# Patient Record
Sex: Female | Born: 1950 | Race: White | Hispanic: No | Smoking: Never smoker
Health system: Southern US, Community
[De-identification: ages and names within clinical notes are randomized; demographics above are authoritative.]

## PROBLEM LIST (undated history)

## (undated) DIAGNOSIS — E039 Hypothyroidism, unspecified: Secondary | ICD-10-CM

## (undated) DIAGNOSIS — M179 Osteoarthritis of knee, unspecified: Secondary | ICD-10-CM

## (undated) DIAGNOSIS — M1710 Unilateral primary osteoarthritis, unspecified knee: Secondary | ICD-10-CM

## (undated) DIAGNOSIS — E7800 Pure hypercholesterolemia, unspecified: Secondary | ICD-10-CM

## (undated) HISTORY — DX: Osteoarthritis of knee, unspecified: M17.9

## (undated) HISTORY — DX: Pure hypercholesterolemia, unspecified: E78.00

## (undated) HISTORY — PX: JOINT REPLACEMENT: SHX530

## (undated) HISTORY — PX: EYE SURGERY: SHX253

## (undated) HISTORY — DX: Unilateral primary osteoarthritis, unspecified knee: M17.10

## (undated) HISTORY — DX: Hypothyroidism, unspecified: E03.9

---

## 2015-03-22 HISTORY — PX: OTHER SURGICAL HISTORY: SHX169

## 2016-04-29 LAB — TSH: TSH: 18.31 — AB (ref 0.41–5.90)

## 2017-08-31 ENCOUNTER — Ambulatory Visit: Payer: Self-pay | Admitting: Family Medicine

## 2017-10-06 ENCOUNTER — Ambulatory Visit (INDEPENDENT_AMBULATORY_CARE_PROVIDER_SITE_OTHER): Payer: 59 | Admitting: Family Medicine

## 2017-10-06 ENCOUNTER — Encounter: Payer: Self-pay | Admitting: Family Medicine

## 2017-10-06 VITALS — BP 160/90 | HR 100 | Temp 97.4°F | Wt 253.6 lb

## 2017-10-06 DIAGNOSIS — E039 Hypothyroidism, unspecified: Secondary | ICD-10-CM | POA: Diagnosis not present

## 2017-10-06 DIAGNOSIS — Z23 Encounter for immunization: Secondary | ICD-10-CM | POA: Diagnosis not present

## 2017-10-06 DIAGNOSIS — M2550 Pain in unspecified joint: Secondary | ICD-10-CM

## 2017-10-06 DIAGNOSIS — E7800 Pure hypercholesterolemia, unspecified: Secondary | ICD-10-CM | POA: Diagnosis not present

## 2017-10-06 LAB — CBC WITH DIFFERENTIAL/PLATELET
Basophils Absolute: 0.1 10*3/uL (ref 0.0–0.1)
Basophils Relative: 0.7 % (ref 0.0–3.0)
Eosinophils Absolute: 0.4 10*3/uL (ref 0.0–0.7)
Eosinophils Relative: 3.9 % (ref 0.0–5.0)
HCT: 43.4 % (ref 36.0–46.0)
Hemoglobin: 14 g/dL (ref 12.0–15.0)
Lymphocytes Relative: 23.2 % (ref 12.0–46.0)
Lymphs Abs: 2.1 10*3/uL (ref 0.7–4.0)
MCHC: 32.3 g/dL (ref 30.0–36.0)
MCV: 87.4 fl (ref 78.0–100.0)
Monocytes Absolute: 0.6 10*3/uL (ref 0.1–1.0)
Monocytes Relative: 6.4 % (ref 3.0–12.0)
Neutro Abs: 6.1 10*3/uL (ref 1.4–7.7)
Neutrophils Relative %: 65.8 % (ref 43.0–77.0)
Platelets: 253 10*3/uL (ref 150.0–400.0)
RBC: 4.97 Mil/uL (ref 3.87–5.11)
RDW: 14.6 % (ref 11.5–15.5)
WBC: 9.2 10*3/uL (ref 4.0–10.5)

## 2017-10-06 LAB — COMPREHENSIVE METABOLIC PANEL
ALT: 10 U/L (ref 0–35)
AST: 17 U/L (ref 0–37)
Albumin: 4.3 g/dL (ref 3.5–5.2)
Alkaline Phosphatase: 96 U/L (ref 39–117)
BUN: 19 mg/dL (ref 6–23)
CO2: 29 mEq/L (ref 19–32)
Calcium: 9.6 mg/dL (ref 8.4–10.5)
Chloride: 103 mEq/L (ref 96–112)
Creatinine, Ser: 0.96 mg/dL (ref 0.40–1.20)
GFR: 62.12 mL/min (ref >60.00)
Glucose, Bld: 89 mg/dL (ref 70–99)
Potassium: 4.2 mEq/L (ref 3.5–5.1)
Sodium: 141 mEq/L (ref 135–145)
Total Bilirubin: 0.5 mg/dL (ref 0.2–1.2)
Total Protein: 7.5 g/dL (ref 6.0–8.3)

## 2017-10-06 LAB — LIPID PANEL
Cholesterol: 318 mg/dL — ABNORMAL HIGH (ref 0–200)
HDL: 72.2 mg/dL (ref >39.00)
LDL Cholesterol: 217 mg/dL — ABNORMAL HIGH (ref 0–99)
NonHDL: 245.46
Total CHOL/HDL Ratio: 4
Triglycerides: 144 mg/dL (ref 0.0–149.0)
VLDL: 28.8 mg/dL (ref 0.0–40.0)

## 2017-10-06 LAB — TSH: TSH: 112.51 u[IU]/mL — ABNORMAL HIGH (ref 0.35–4.50)

## 2017-10-06 LAB — T4, FREE: Free T4: 0.39 ng/dL — ABNORMAL LOW (ref 0.60–1.60)

## 2017-10-06 MED ORDER — MELOXICAM 15 MG PO TABS: 15.0000 mg | ORAL_TABLET | Freq: Every day | ORAL | 3 refills | Status: DC

## 2017-10-06 MED ORDER — LEVOTHYROXINE SODIUM 75 MCG PO TABS: 75.0000 ug | ORAL_TABLET | Freq: Every day | ORAL | 0 refills | Status: DC

## 2017-10-06 NOTE — Progress Notes (Signed)
Tina Dalton is a 67 y.o. female is here to [facility].   Patient Care Team: Helane Rima, DO as PCP - General (Family Medicine)   History of Present Illness:   HPI: See Assessment and Plan section for Problem Based Charting of issues discussed today.  Health Maintenance Due  Topic Date Due  . Hepatitis C Screening  1951/07/07  . HIV Screening  06/02/1968  . TETANUS/TDAP  06/02/1972  . MAMMOGRAM  06/03/2003   Depression screen PHQ 2/9 10/06/2017  Decreased Interest 0  Down, Depressed, Hopeless 0  PHQ - 2 Score 0   PMHx, SurgHx, SocialHx, Medications, and Allergies were reviewed in the Visit Navigator and updated as appropriate.   History reviewed. No pertinent past medical history. History reviewed. No pertinent surgical history. History reviewed. No pertinent family history. Social History   Tobacco Use  . Smoking status: Not on file  . Smokeless tobacco: Never Used  Substance Use Topics  . Alcohol use: Not on file  . Drug use: Not on file   Current Medications and Allergies:   .  None  No Known Allergies   Review of Systems:   Pertinent items are noted in the HPI. Otherwise, ROS is negative.  Vitals:   Vitals:   10/06/17 0913  BP: (!) 160/90  Pulse: 100  Temp: (!) 97.4 F (36.3 C)  TempSrc: Oral  SpO2: 97%  Weight: 253 lb 9.6 oz (115 kg)     There is no height or weight on file to calculate BMI.  Physical Exam:   Physical Exam  Constitutional: She is oriented to person, place, and time. She appears well-developed and well-nourished. No distress.  HENT:  Head: Normocephalic and atraumatic.  Right Ear: External ear normal.  Left Ear: External ear normal.  Nose: Nose normal.  Mouth/Throat: Oropharynx is clear and moist.  Eyes: Conjunctivae and EOM are normal. Pupils are equal, round, and reactive to light.  Neck: Normal range of motion. Neck supple. No thyromegaly present.  Cardiovascular: Normal rate, regular rhythm, normal heart sounds  and intact distal pulses.  Pulmonary/Chest: Effort normal and breath sounds normal.  Abdominal: Soft. Bowel sounds are normal.  Musculoskeletal: Normal range of motion.  Lymphadenopathy:    She has no cervical adenopathy.  Neurological: She is alert and oriented to person, place, and time.  Skin: Skin is warm and dry. Capillary refill takes less than 2 seconds.  Psychiatric: She has a normal mood and affect. Her behavior is normal.  Nursing note and vitals reviewed.  Results for orders placed or performed in visit on 10/06/17  T4, free  Result Value Ref Range   Free T4 0.39 (L) 0.60 - 1.60 ng/dL  TSH  Result Value Ref Range   TSH 112.51 (H) 0.35 - 4.50 uIU/mL  CBC with Differential/Platelet  Result Value Ref Range   WBC 9.2 4.0 - 10.5 K/uL   RBC 4.97 3.87 - 5.11 Mil/uL   Hemoglobin 14.0 12.0 - 15.0 g/dL   HCT 69.6 29.5 - 28.4 %   MCV 87.4 78.0 - 100.0 fl   MCHC 32.3 30.0 - 36.0 g/dL   RDW 13.2 44.0 - 10.2 %   Platelets 253.0 150.0 - 400.0 K/uL   Neutrophils Relative % 65.8 43.0 - 77.0 %   Lymphocytes Relative 23.2 12.0 - 46.0 %   Monocytes Relative 6.4 3.0 - 12.0 %   Eosinophils Relative 3.9 0.0 - 5.0 %   Basophils Relative 0.7 0.0 - 3.0 %   Neutro  Abs 6.1 1.4 - 7.7 K/uL   Lymphs Abs 2.1 0.7 - 4.0 K/uL   Monocytes Absolute 0.6 0.1 - 1.0 K/uL   Eosinophils Absolute 0.4 0.0 - 0.7 K/uL   Basophils Absolute 0.1 0.0 - 0.1 K/uL  Comprehensive metabolic panel  Result Value Ref Range   Sodium 141 135 - 145 mEq/L   Potassium 4.2 3.5 - 5.1 mEq/L   Chloride 103 96 - 112 mEq/L   CO2 29 19 - 32 mEq/L   Glucose, Bld 89 70 - 99 mg/dL   BUN 19 6 - 23 mg/dL   Creatinine, Ser 4.540.96 0.40 - 1.20 mg/dL   Total Bilirubin 0.5 0.2 - 1.2 mg/dL   Alkaline Phosphatase 96 39 - 117 U/L   AST 17 0 - 37 U/L   ALT 10 0 - 35 U/L   Total Protein 7.5 6.0 - 8.3 g/dL   Albumin 4.3 3.5 - 5.2 g/dL   Calcium 9.6 8.4 - 09.810.5 mg/dL   GFR 11.9162.12 >47.82>60.00 mL/min  Lipid panel  Result Value Ref Range    Cholesterol 318 (H) 0 - 200 mg/dL   Triglycerides 956.2144.0 0.0 - 149.0 mg/dL   HDL 13.0872.20 >65.78>39.00 mg/dL   VLDL 46.928.8 0.0 - 62.940.0 mg/dL   LDL Cholesterol 528217 (H) 0 - 99 mg/dL   Total CHOL/HDL Ratio 4    NonHDL 245.46    Assessment and Plan:   1. Acquired hypothyroidism Endocrine ROS: positive for - hair pattern changes, malaise/lethargy, skin changes and temperature intolerance; negative for - mood swings, palpitations or unexpected weight changes. The patient has NOT BEEN TAKING HER LEVOTHYROXINE FOR MONTHS. We discussed the dangers associated with this. Will restart at 75 mcg, with instructions for the patient to increase after a few weeks. We will recheck in 2 months. Red falgs reviewed.  - T4, free - TSH - CBC with Differential/Platelet - Comprehensive metabolic panel - levothyroxine (SYNTHROID, LEVOTHROID) 75 MCG tablet; Take 1 tablet (75 mcg total) by mouth daily for 14 days.  Dispense: 14 tablet; Refill: 0  2. Need for immunization against influenza - Flu Vaccine QUAD 36+ mos IM  3. Pure hypercholesterolemia  Lab Results  Component Value Date   CHOL 318 (H) 10/06/2017   HDL 72.20 10/06/2017   LDLCALC 217 (H) 10/06/2017   TRIG 144.0 10/06/2017   CHOLHDL 4 10/06/2017   I will recommend a statin.   - Lipid panel  4. Arthralgia of multiple joints Usually taking NSAIDs OTC daily. Would like to try something daily.  - meloxicam (MOBIC) 15 MG tablet; Take 1 tablet (15 mg total) by mouth daily.  Dispense: 30 tablet; Refill: 3   . Reviewed expectations re: course of current medical issues. . Discussed self-management of symptoms. . Outlined signs and symptoms indicating need for more acute intervention. . Patient verbalized understanding and all questions were answered. Marland Kitchen. Health Maintenance issues including appropriate healthy diet, exercise, and smoking avoidance were discussed with patient. . See orders for this visit as documented in the electronic medical record. . Patient  received an After Visit Summary.  Helane RimaErica Griselda Tosh, DO [place] 10/10/2017  Records requested if needed. Time spent with the patient: 30 minutes, of which >50% was spent in obtaining information about her symptoms, reviewing her previous labs, evaluations, and treatments, counseling her about her condition (please see the discussed topics above), and developing a plan to further investigate it; she had a number of questions which I addressed.

## 2017-10-18 ENCOUNTER — Telehealth: Payer: Self-pay | Admitting: Family Medicine

## 2017-10-18 ENCOUNTER — Other Ambulatory Visit: Payer: Self-pay

## 2017-10-18 DIAGNOSIS — E039 Hypothyroidism, unspecified: Secondary | ICD-10-CM

## 2017-10-18 MED ORDER — LEVOTHYROXINE SODIUM 150 MCG PO TABS: 150.0000 ug | ORAL_TABLET | Freq: Every day | ORAL | 3 refills | Status: DC

## 2017-10-18 NOTE — Telephone Encounter (Signed)
Please advise 

## 2017-10-18 NOTE — Telephone Encounter (Signed)
Copied from CRM [phone]. Topic: Quick Communication - Rx Refill/Question >> Oct 18, 2017 11:36 AM Oneal GroutSebastian, Jennifer S wrote: Medication: levothyroxine (SYNTHROID, LEVOTHROID) 75 MCG tablet; Take 1 tablet , states dose should be increase?   Has the patient contacted their pharmacy? No. Dose change   (Agent: If no, request that the patient contact the pharmacy for the refill.)   Preferred Pharmacy (with phone number or street name): [facility] [address]    Agent: Please be advised that RX refills may take up to 3 business days. We ask that you follow-up with your pharmacy.

## 2017-10-18 NOTE — Telephone Encounter (Signed)
Scrip sent 

## 2017-10-25 ENCOUNTER — Telehealth: Payer: Self-pay | Admitting: Family Medicine

## 2017-10-25 MED ORDER — SIMVASTATIN 20 MG PO TABS: 20.0000 mg | ORAL_TABLET | Freq: Every evening | ORAL | 3 refills | Status: DC

## 2017-10-25 NOTE — Telephone Encounter (Signed)
[facility] but was [facility] after patient did return call and we were not able to reach it looks like it was closed. Below is the original message to patient.   Let the patient know that her TSH was very high as expected. I gave 75 mcg tabs after the last visit. If tolerating, okay to increase to 150 mcg. Recheck labs (TSH, free T4) after taking higher dose for 6 weeks.   Cholesterol high. HDL good and high but LDL also high. Would be reasonable to start a statin. Let me know if she is up for it.

## 2017-10-25 NOTE — Telephone Encounter (Signed)
Zocor sent in.

## 2017-10-25 NOTE — Telephone Encounter (Signed)
Pt given results per notes of Dr. Earlene PlaterWallace on 10/25/17 Unable to document in result note due to [facility] being [facility]. She has started the increased dose of Synthroid.  Patient is willing to start a statin as recommended by physician.  [facility] [address] This is her pharmacy of choice and is documented in chart. She will phone later to schedule 6 month follow-up labs.

## 2017-10-27 ENCOUNTER — Encounter: Payer: Self-pay | Admitting: Family Medicine

## 2017-10-28 LAB — COLOGUARD

## 2017-11-04 ENCOUNTER — Telehealth: Payer: Self-pay

## 2017-11-04 NOTE — Telephone Encounter (Signed)
Results received as positive. Report put in your green folder for you to review.

## 2017-11-08 NOTE — Telephone Encounter (Signed)
Please call patient to come in for appointment to discuss all labs. Let me know if we are still unable to contact her.

## 2017-11-09 NOTE — Telephone Encounter (Signed)
Pt informed app made.  

## 2017-11-17 ENCOUNTER — Ambulatory Visit (INDEPENDENT_AMBULATORY_CARE_PROVIDER_SITE_OTHER): Payer: 59 | Admitting: Family Medicine

## 2017-11-17 ENCOUNTER — Encounter: Payer: Self-pay | Admitting: Family Medicine

## 2017-11-17 VITALS — BP 150/88 | HR 82 | Temp 98.6°F | Wt 252.6 lb

## 2017-11-17 DIAGNOSIS — R030 Elevated blood-pressure reading, without diagnosis of hypertension: Secondary | ICD-10-CM | POA: Insufficient documentation

## 2017-11-17 DIAGNOSIS — I10 Essential (primary) hypertension: Secondary | ICD-10-CM | POA: Diagnosis not present

## 2017-11-17 DIAGNOSIS — M2550 Pain in unspecified joint: Secondary | ICD-10-CM | POA: Diagnosis not present

## 2017-11-17 DIAGNOSIS — E7800 Pure hypercholesterolemia, unspecified: Secondary | ICD-10-CM | POA: Diagnosis not present

## 2017-11-17 DIAGNOSIS — R195 Other fecal abnormalities: Secondary | ICD-10-CM | POA: Diagnosis not present

## 2017-11-17 DIAGNOSIS — E039 Hypothyroidism, unspecified: Secondary | ICD-10-CM | POA: Diagnosis not present

## 2017-11-17 DIAGNOSIS — Z23 Encounter for immunization: Secondary | ICD-10-CM

## 2017-11-17 LAB — TSH: TSH: 0.94 u[IU]/mL (ref 0.35–4.50)

## 2017-11-17 LAB — T4, FREE: Free T4: 1.18 ng/dL (ref 0.60–1.60)

## 2017-11-17 NOTE — Progress Notes (Deleted)
   Tina Dalton is a 67 y.o. female is here for follow up.  History of Present Illness:   {CMA SCRIBE ATTESTATION}  HPI:   1. Acquired hypothyroidism   2. Pure hypercholesterolemia   3. Positive colorectal cancer screening using Cologuard test   4. Arthralgia of multiple joints   5. Essential hypertension    ROS  Health Maintenance Due  Topic Date Due  . Hepatitis C Screening  July 18, 1951  . HIV Screening  06/02/1968  . TETANUS/TDAP  06/02/1972  . MAMMOGRAM  06/03/2003   Depression screen PHQ 2/9 10/06/2017  Decreased Interest 0  Down, Depressed, Hopeless 0  PHQ - 2 Score 0   The 10-year ASCVD risk score Denman George(Goff DC Jr., et al., 2013) is: 7.8%   Values used to calculate the score:     Age: [age] years     Sex: Female     Is Non-Hispanic African American: No     Diabetic: No     Tobacco smoker: No     Systolic Blood Pressure: 150 mmHg     Is BP treated: No     HDL Cholesterol: 72.2 mg/dL     Total Cholesterol: 318 mg/dL  PMHx, SurgHx, SocialHx, FamHx, Medications, and Allergies were reviewed in the Visit Navigator and updated as appropriate.   Patient Active Problem List   Diagnosis Date Noted  . Acquired hypothyroidism 11/17/2017  . Pure hypercholesterolemia 11/17/2017  . Arthralgia of multiple joints 11/17/2017   Social History   Tobacco Use  . Smoking status: Never Smoker  . Smokeless tobacco: Never Used  Substance Use Topics  . Alcohol use: Not on file  . Drug use: Not on file   Current Medications and Allergies:   Current Outpatient Medications:  .  levothyroxine (SYNTHROID, LEVOTHROID) 150 MCG tablet, Take 1 tablet (150 mcg total) by mouth daily., Disp: 30 tablet, Rfl: 3 .  meloxicam (MOBIC) 15 MG tablet, Take 1 tablet (15 mg total) by mouth daily., Disp: 30 tablet, Rfl: 3 .  simvastatin (ZOCOR) 20 MG tablet, Take 1 tablet (20 mg total) by mouth every evening., Disp: 90 tablet, Rfl: 3  No Known Allergies Review of Systems   Pertinent items are noted in  the HPI. Otherwise, ROS is negative.  Vitals:   Vitals:   11/17/17 0846  BP: (!) 150/88  Pulse: 82  Temp: 98.6 F (37 C)  TempSrc: Oral  SpO2: 97%  Weight: 252 lb 9.6 oz (114.6 kg)     There is no height or weight on file to calculate BMI.  Physical Exam:   Physical Exam Results for orders placed or performed in visit on 10/19/17  TSH  Result Value Ref Range   TSH 18.31 (A) 0.41 - 5.90    Assessment and Plan:   ***

## 2017-11-17 NOTE — Progress Notes (Signed)
Tina Dalton is a 67 y.o. female is here for follow up.  History of Present Illness:   Barnie MortJoEllen Thompson, CMA acting as scribe for Dr. Helane RimaErica Nyjah Denio.   HPI: Patient in office today to review cologued results.   1. Acquired hypothyroidism.  Tolerating medication.  No chest pain or shortness of breath or swelling.   2. Pure hypercholesterolemia.  Not taking prescribed statin.   3. Positive colorectal cancer screening using Cologuard test. Here to discuss.   4. Arthralgia of multiple joints.  Taking Mobic without any issues.   Review of Systems  Constitutional: Negative for chills, fever and weight loss.  HENT: Negative for ear pain and hearing loss.   Eyes: Negative for blurred vision and double vision.  Respiratory: Negative for cough.   Cardiovascular: Negative for chest pain and palpitations.  Gastrointestinal: Negative for heartburn, nausea and vomiting.  Genitourinary: Negative for dysuria and urgency.  Musculoskeletal: Negative for myalgias.  Neurological: Negative for dizziness and headaches.  Endo/Heme/Allergies: Does not bruise/bleed easily.   Health Maintenance Due  Topic Date Due  . Hepatitis C Screening  27-Jul-1951  . HIV Screening  06/02/1968  . TETANUS/TDAP  06/02/1972  . MAMMOGRAM  06/03/2003   Depression screen PHQ 2/9 10/06/2017  Decreased Interest 0  Down, Depressed, Hopeless 0  PHQ - 2 Score 0   PMHx, SurgHx, SocialHx, FamHx, Medications, and Allergies were reviewed in the Visit Navigator and updated as appropriate.   Patient Active Problem List   Diagnosis Date Noted  . Acquired hypothyroidism 11/17/2017  . Pure hypercholesterolemia 11/17/2017  . Arthralgia of multiple joints 11/17/2017   Social History   Tobacco Use  . Smoking status: Never Smoker  . Smokeless tobacco: Never Used  Substance Use Topics  . Alcohol use: Not on file  . Drug use: Not on file   Current Medications and Allergies:   .  levothyroxine (SYNTHROID, LEVOTHROID) 150  MCG tablet, Take 1 tablet (150 mcg total) by mouth daily., Disp: 30 tablet, Rfl: 3 .  meloxicam (MOBIC) 15 MG tablet, Take 1 tablet (15 mg total) by mouth daily., Disp: 30 tablet, Rfl: 3  No Known Allergies   Review of Systems   Pertinent items are noted in the HPI. Otherwise, ROS is negative.  Vitals:   Vitals:   11/17/17 0846  BP: (!) 150/88  Pulse: 82  Temp: 98.6 F (37 C)  TempSrc: Oral  SpO2: 97%  Weight: 252 lb 9.6 oz (114.6 kg)     There is no height or weight on file to calculate BMI.  Physical Exam:   Physical Exam  Constitutional: She is oriented to person, place, and time. She appears well-developed and well-nourished. No distress.  HENT:  Head: Normocephalic and atraumatic.  Right Ear: External ear normal.  Left Ear: External ear normal.  Nose: Nose normal.  Mouth/Throat: Oropharynx is clear and moist.  Eyes: Conjunctivae and EOM are normal. Pupils are equal, round, and reactive to light.  Neck: Normal range of motion. Neck supple. No thyromegaly present.  Cardiovascular: Normal rate, regular rhythm, normal heart sounds and intact distal pulses.  Pulmonary/Chest: Effort normal and breath sounds normal.  Abdominal: Soft. Bowel sounds are normal.  Musculoskeletal: Normal range of motion.  Lymphadenopathy:    She has no cervical adenopathy.  Neurological: She is alert and oriented to person, place, and time.  Skin: Skin is warm and dry. Capillary refill takes less than 2 seconds.  Psychiatric: She has a normal mood and affect. Her behavior  is normal.  Nursing note and vitals reviewed.   Assessment and Plan:   The 10-year ASCVD risk score Denman George DC Montez Hageman., et al., 2013) is: 7.8%   Values used to calculate the score:     Age: 89 years     Sex: Female     Is Non-Hispanic African American: No     Diabetic: No     Tobacco smoker: No     Systolic Blood Pressure: 150 mmHg     Is BP treated: No     HDL Cholesterol: 72.2 mg/dL     Total Cholesterol: 318  mg/dL  1. Acquired hypothyroidism Patient is taking her prescribed levothyroxine.  She has had no problems with this.  She is feeling better.  We will go ahead and recheck today.  - T4, free - TSH  2. Pure hypercholesterolemia Patient did not pick up the statin recommended after our last visit.  She will pick it up and started today.  We can recheck her panel in 3-6 months.  3. Positive colorectal cancer screening using Cologuard test We discussed the positive test today.  She would like to discuss the significance of the test with gastroenterology.  She wants to hold off on scheduling a colonoscopy just yet.  - Ambulatory referral to Gastroenterology  4. Arthralgia of multiple joints Improved.  Continue Mobic.  5. Essential hypertension Second documented high blood pressure today.  Patient declines treatment today.  She understands that I may recommend this in the future.  6. Need for prophylactic vaccination against diphtheria-tetanus-pertussis (DTP) - Tdap vaccine greater than or equal to 7yo IM   . Reviewed expectations re: course of current medical issues. . Discussed self-management of symptoms. . Outlined signs and symptoms indicating need for more acute intervention. . Patient verbalized understanding and all questions were answered. Marland Kitchen Health Maintenance issues including appropriate healthy diet, exercise, and smoking avoidance were discussed with patient. . See orders for this visit as documented in the electronic medical record. . Patient received an After Visit Summary.  CMA served as Neurosurgeon during this visit. History, Physical, and Plan performed by medical provider. The above documentation has been reviewed and is accurate and complete. Helane Rima, D.O.  Helane Rima, DO [place] 11/17/2017

## 2017-12-15 ENCOUNTER — Other Ambulatory Visit: Payer: Self-pay | Admitting: Surgical

## 2017-12-15 DIAGNOSIS — E039 Hypothyroidism, unspecified: Secondary | ICD-10-CM

## 2017-12-15 MED ORDER — LEVOTHYROXINE SODIUM 150 MCG PO TABS: 150.0000 ug | ORAL_TABLET | Freq: Every day | ORAL | 1 refills | Status: DC

## 2018-01-18 ENCOUNTER — Encounter: Payer: Self-pay | Admitting: Family Medicine

## 2018-06-04 ENCOUNTER — Other Ambulatory Visit: Payer: Self-pay | Admitting: Family Medicine

## 2018-06-04 DIAGNOSIS — E039 Hypothyroidism, unspecified: Secondary | ICD-10-CM

## 2018-06-09 ENCOUNTER — Ambulatory Visit (INDEPENDENT_AMBULATORY_CARE_PROVIDER_SITE_OTHER): Payer: Medicare Other | Admitting: Family Medicine

## 2018-06-09 ENCOUNTER — Encounter: Payer: Self-pay | Admitting: Family Medicine

## 2018-06-09 VITALS — BP 120/80 | HR 83 | Temp 98.3°F | Ht 70.0 in | Wt 239.2 lb

## 2018-06-09 DIAGNOSIS — H6121 Impacted cerumen, right ear: Secondary | ICD-10-CM

## 2018-06-09 DIAGNOSIS — J029 Acute pharyngitis, unspecified: Secondary | ICD-10-CM | POA: Diagnosis not present

## 2018-06-09 LAB — POCT RAPID STREP A (OFFICE): Rapid Strep A Screen: NEGATIVE

## 2018-06-09 MED ORDER — FLUTICASONE PROPIONATE 50 MCG/ACT NA SUSP: 2.0000 | Freq: Every day | NASAL | 6 refills | Status: DC

## 2018-06-09 NOTE — Progress Notes (Signed)
Patient: Tina Dalton MRN: [id number] DOB: [birth year] PCP: Helane Rima, DO     Subjective:  Chief Complaint  Patient presents with  . Sore Throat  . right ear pain    HPI: The patient is a 67 y.o. female who presents today for right ear pain and a sore throat. A week ago she was with her 67 year old Tina Dalton who had a sore throat about one week ago and was fine. Tina Dalton started to have a sore throat about one week ago and continues to have pain. Her right ear started to hurt yesterday. She is having no pain or pressure in her sinuses, but is a "snot factory" with her nose. She has no coughing or shortness of breath. Tina Dalton did not have strep throat. She has no stomach pain, N/V/D. Eating and drinking fine. She has taken cough drops, tylenol cold and flu with minimal relief. She has also lost her voice and is very fatigued.   Review of Systems  Constitutional: Negative for chills and fever.  HENT: Positive for congestion, ear pain and sore throat. Negative for sinus pressure and sinus pain.        C/o right ear pain  Respiratory: Positive for cough. Negative for shortness of breath.   Cardiovascular: Negative for chest pain.  Gastrointestinal: Negative for abdominal pain, nausea and vomiting.  Musculoskeletal: Negative for neck pain.  Neurological: Negative for dizziness and headaches.    Allergies Patient has No Known Allergies.  Past Medical History Patient  has a past medical history of Hypercholesterolemia, Hypothyroidism, and Osteoarthritis of knee.  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  Family History Pateint's family history includes Alzheimer's disease in her mother; Cancer in her father; Coronary artery disease in her father; Hypertension in her father.  Social History Patient  reports that she has never smoked. She has never used smokeless tobacco.    Objective: Vitals:   06/09/18  0956  BP: 120/80  Pulse: 83  Temp: 98.3 F (36.8 C)  TempSrc: Oral  SpO2: 97%  Weight: 239 lb 3.2 oz (108.5 kg)  Height: 5\' 10"  (1.778 m)    Body mass index is 34.32 kg/m.  Physical Exam  Constitutional: She appears well-developed and well-nourished.  HENT:  Right Ear: Ear canal normal.  Left Ear: Tympanic membrane and ear canal normal.  Mouth/Throat: Mucous membranes are normal. No oral lesions. Posterior oropharyngeal erythema present. No oropharyngeal exudate. Tonsils are 0 on the right. Tonsils are 0 on the left. No tonsillar exudate.  Right TM occluded by cerumen   Cardiovascular: Normal rate, regular rhythm and normal heart sounds.  Pulmonary/Chest: Effort normal and breath sounds normal.  Abdominal: Soft. Bowel sounds are normal.  Lymphadenopathy:    She has no cervical adenopathy.  Vitals reviewed.  Rapid strep: negative  Ceruminosis is noted.  Wax is removed by syringing and manual debridement. Instructions for home care to prevent wax buildup are given. After removed TM visualized. Pearly with light reflex. No signs of infection.      Assessment/plan: 1. Sore throat Secondary to post nasal drip and possible viral pharyngitis. Sending in flonase for her and recommended cool mist humidifier and zyrtec. No clinical sings of strep and her strep test is negative. Let us know if not getting better.  - POCT rapid strep A  2. Impacted cerumen, right ear Removed with lavage. No signs of infection. She is to let me know if ear pain  persists.      Return if symptoms worsen or fail to improve.   Tina MustardAllison Maurita Havener, MD [place] [facility]   06/09/2018

## 2018-06-09 NOTE — Patient Instructions (Signed)

## 2018-06-16 NOTE — Progress Notes (Signed)
Iliza Blankenbeckler is a 67 y.o. female is here for follow up.  History of Present Illness:   HPI:   1. Acquired hypothyroidism.  Tolerating medication.  No chest pain or shortness of breath or swelling.   2. Pure hypercholesterolemia. Not taking statin.    3. Essential hypertension.   Review: taking medications as instructed, no medication side effects noted, no TIAs, no chest pain on exertion, no dyspnea on exertion, no swelling of ankles.  Smoker: No.  Wt Readings from Last 3 Encounters:  06/17/18 242 lb 6.4 oz (110 kg)  06/09/18 239 lb 3.2 oz (108.5 kg)  11/17/17 252 lb 9.6 oz (114.6 kg)   BP Readings from Last 3 Encounters:  06/17/18 120/84  06/09/18 120/80  11/17/17 (!) 150/88   Lab Results  Component Value Date   CREATININE 0.78 06/17/2018     4. Right ear pain. New, for a few weeks. No dizziness or tinnitus. Pain radiates to jaw. No treatment. No fever.    Health Maintenance Due  Topic Date Due  . MAMMOGRAM  06/03/2003  . DEXA SCAN  06/02/2018  . PNA vac Low Risk Adult (1 of 2 - PCV13) 06/02/2018   Depression screen PHQ 2/9 10/06/2017  Decreased Interest 0  Down, Depressed, Hopeless 0  PHQ - 2 Score 0   PMHx, SurgHx, SocialHx, FamHx, Medications, and Allergies were reviewed in the Visit Navigator and updated as appropriate.   Patient Active Problem List   Diagnosis Date Noted  . Acquired hypothyroidism 11/17/2017  . Pure hypercholesterolemia 11/17/2017  . Arthralgia of multiple joints 11/17/2017  . Essential hypertension 11/17/2017   Social History   Tobacco Use  . Smoking status: Never Smoker  . Smokeless tobacco: Never Used  Substance Use Topics  . Alcohol use: Not on file  . Drug use: Not on file   Current Medications and Allergies:   .  fluticasone (FLONASE) 50 MCG/ACT nasal spray, Place 2 sprays into both nostrils daily., Disp: 16 g, Rfl: 6 .  levothyroxine (SYNTHROID, LEVOTHROID) 150 MCG tablet, TAKE 1 TABLET BY MOUTH EVERY DAY, Disp: 90 tablet,  Rfl: 1  No Known Allergies   Review of Systems   Pertinent items are noted in the HPI. Otherwise, ROS is negative.  Vitals:   Vitals:   06/17/18 1147  BP: 120/84  Pulse: 90  Temp: 98.7 F (37.1 C)  TempSrc: Oral  SpO2: 96%  Weight: 242 lb 6.4 oz (110 kg)  Height: 5\' 10"  (1.778 m)     Body mass index is 34.78 kg/m.  Physical Exam:   Physical Exam  Constitutional: She appears well-nourished.  HENT:  Head: Normocephalic and atraumatic.  Eyes: Pupils are equal, round, and reactive to light. EOM are normal.  Neck: Normal range of motion. Neck supple.  Cardiovascular: Normal rate, regular rhythm, normal heart sounds and intact distal pulses.  Pulmonary/Chest: Effort normal.  Abdominal: Soft.  Skin: Skin is warm.  Psychiatric: She has a normal mood and affect. Her behavior is normal.  Nursing note and vitals reviewed.  Assessment and Plan:   Adelene was seen today for medication refill.  Diagnoses and all orders for this visit:  Acquired hypothyroidism -     TSH -     T4, free  Pure hypercholesterolemia -     Comprehensive metabolic panel -     Lipid panel -     LDL cholesterol, direct  Essential hypertension  Acute suppurative otitis media of right ear without spontaneous rupture of  tympanic membrane, recurrence not specified -     amoxicillin (AMOXIL) 875 MG tablet; Take 1 tablet (875 mg total) by mouth 2 (two) times daily.  Encounter for immunization -     Flu vaccine HIGH DOSE PF  Screening for breast cancer -     MM 3D SCREEN BREAST BILATERAL; Future  Estrogen deficiency -     DG Bone Density; Future  Encounter for screening for HIV -     HIV Antibody (routine testing w rflx)  Encounter for hepatitis C virus screening test for high risk patient -     Hepatitis C antibody  Influenza vaccine needed -     Flu vaccine HIGH DOSE PF   . Reviewed expectations re: course of current medical issues. . Discussed self-management of symptoms. . Outlined  signs and symptoms indicating need for more acute intervention. . Patient verbalized understanding and all questions were answered. Marland Kitchen Health Maintenance issues including appropriate healthy diet, exercise, and smoking avoidance were discussed with patient. . See orders for this visit as documented in the electronic medical record. . Patient received an After Visit Summary.  Helane Rima, DO [place], [facility] 06/19/2018

## 2018-06-17 ENCOUNTER — Ambulatory Visit (INDEPENDENT_AMBULATORY_CARE_PROVIDER_SITE_OTHER): Payer: Medicare Other | Admitting: Family Medicine

## 2018-06-17 ENCOUNTER — Encounter: Payer: Self-pay | Admitting: Family Medicine

## 2018-06-17 VITALS — BP 120/84 | HR 90 | Temp 98.7°F | Ht 70.0 in | Wt 242.4 lb

## 2018-06-17 DIAGNOSIS — H66001 Acute suppurative otitis media without spontaneous rupture of ear drum, right ear: Secondary | ICD-10-CM

## 2018-06-17 DIAGNOSIS — E039 Hypothyroidism, unspecified: Secondary | ICD-10-CM | POA: Diagnosis not present

## 2018-06-17 DIAGNOSIS — Z1159 Encounter for screening for other viral diseases: Secondary | ICD-10-CM | POA: Diagnosis not present

## 2018-06-17 DIAGNOSIS — Z9189 Other specified personal risk factors, not elsewhere classified: Secondary | ICD-10-CM | POA: Diagnosis not present

## 2018-06-17 DIAGNOSIS — Z23 Encounter for immunization: Secondary | ICD-10-CM | POA: Diagnosis not present

## 2018-06-17 DIAGNOSIS — Z114 Encounter for screening for human immunodeficiency virus [HIV]: Secondary | ICD-10-CM

## 2018-06-17 DIAGNOSIS — I10 Essential (primary) hypertension: Secondary | ICD-10-CM

## 2018-06-17 DIAGNOSIS — Z1239 Encounter for other screening for malignant neoplasm of breast: Secondary | ICD-10-CM

## 2018-06-17 DIAGNOSIS — E2839 Other primary ovarian failure: Secondary | ICD-10-CM

## 2018-06-17 DIAGNOSIS — Z1231 Encounter for screening mammogram for malignant neoplasm of breast: Secondary | ICD-10-CM

## 2018-06-17 DIAGNOSIS — E7800 Pure hypercholesterolemia, unspecified: Secondary | ICD-10-CM

## 2018-06-17 LAB — COMPREHENSIVE METABOLIC PANEL
ALT: 18 U/L (ref 0–35)
AST: 18 U/L (ref 0–37)
Albumin: 3.9 g/dL (ref 3.5–5.2)
Alkaline Phosphatase: 83 U/L (ref 39–117)
BUN: 15 mg/dL (ref 6–23)
CO2: 28 mEq/L (ref 19–32)
Calcium: 9.7 mg/dL (ref 8.4–10.5)
Chloride: 104 mEq/L (ref 96–112)
Creatinine, Ser: 0.78 mg/dL (ref 0.40–1.20)
GFR: 78.77 mL/min (ref >60.00)
Glucose, Bld: 86 mg/dL (ref 70–99)
Potassium: 4.1 mEq/L (ref 3.5–5.1)
Sodium: 140 mEq/L (ref 135–145)
Total Bilirubin: 0.3 mg/dL (ref 0.2–1.2)
Total Protein: 7.3 g/dL (ref 6.0–8.3)

## 2018-06-17 LAB — LIPID PANEL
Cholesterol: 214 mg/dL — ABNORMAL HIGH (ref 0–200)
HDL: 48.8 mg/dL (ref >39.00)
NonHDL: 165.36
Total CHOL/HDL Ratio: 4
Triglycerides: 230 mg/dL — ABNORMAL HIGH (ref 0.0–149.0)
VLDL: 46 mg/dL — ABNORMAL HIGH (ref 0.0–40.0)

## 2018-06-17 LAB — TSH: TSH: 0.23 u[IU]/mL — ABNORMAL LOW (ref 0.35–4.50)

## 2018-06-17 LAB — LDL CHOLESTEROL, DIRECT: Direct LDL: 139 mg/dL

## 2018-06-17 LAB — T4, FREE: Free T4: 1.46 ng/dL (ref 0.60–1.60)

## 2018-06-17 MED ORDER — AMOXICILLIN 875 MG PO TABS: 875.0000 mg | ORAL_TABLET | Freq: Two times a day (BID) | ORAL | 0 refills | Status: DC

## 2018-06-19 LAB — HEPATITIS C ANTIBODY
Hepatitis C Ab: NONREACTIVE
SIGNAL TO CUT-OFF: 0.05 (ref <1.00)

## 2018-06-19 LAB — HIV ANTIBODY (ROUTINE TESTING W REFLEX): HIV 1&2 Ab, 4th Generation: NONREACTIVE

## 2018-08-02 ENCOUNTER — Ambulatory Visit: Payer: Medicare Other

## 2018-08-26 ENCOUNTER — Ambulatory Visit: Payer: Medicare Other

## 2018-08-26 ENCOUNTER — Other Ambulatory Visit: Payer: Medicare Other

## 2018-12-02 ENCOUNTER — Other Ambulatory Visit: Payer: Self-pay | Admitting: Family Medicine

## 2018-12-02 DIAGNOSIS — E039 Hypothyroidism, unspecified: Secondary | ICD-10-CM

## 2018-12-16 ENCOUNTER — Ambulatory Visit: Payer: Medicare Other | Admitting: Family Medicine

## 2018-12-19 ENCOUNTER — Ambulatory Visit: Payer: Medicare Other | Admitting: Family Medicine

## 2019-01-17 ENCOUNTER — Telehealth: Payer: Self-pay | Admitting: *Deleted

## 2019-01-17 NOTE — Telephone Encounter (Signed)
Left message on voicemail to call office. Please schedule [facility] Annual Wellness visit with Dr. Earlene Plater. Needs to be scheduled before August.

## 2019-05-16 ENCOUNTER — Telehealth: Payer: Self-pay

## 2019-05-16 ENCOUNTER — Telehealth: Payer: Self-pay | Admitting: Family Medicine

## 2019-05-16 DIAGNOSIS — E039 Hypothyroidism, unspecified: Secondary | ICD-10-CM

## 2019-05-16 MED ORDER — LEVOTHYROXINE SODIUM 150 MCG PO TABS: 150.0000 ug | ORAL_TABLET | Freq: Every day | ORAL | 0 refills | Status: DC

## 2019-05-16 NOTE — Telephone Encounter (Signed)
Saw lab orders from last September ? Please advise if pt needs appt for labs   Copied from [place] [phone]. Topic: Appointment Scheduling - Scheduling Inquiry for Clinic >> May 16, 2019  9:48 AM Alanda Slim E wrote: Reason for CRM: Pt would like lab orders to schedule a lab appt. Pt called to refill a medication for her thyroid and would like blood work done/ please advise

## 2019-05-16 NOTE — Telephone Encounter (Signed)
Medication Refill - Medication: levothyroxine (SYNTHROID, LEVOTHROID) 150 MCG tablet    Has the patient contacted their pharmacy? No. (Agent: If no, request that the patient contact the pharmacy for the refill.) (Agent: If yes, when and what did the pharmacy advise?)  Preferred Pharmacy (with phone number or street name) [id number] [address], [place] [phone] (Phone) [phone] (Fax)   :   Agent: Please be advised that RX refills may take up to 3 business days. We ask that you follow-up with your pharmacy.

## 2019-05-16 NOTE — Telephone Encounter (Signed)
Patient need an appointment for a follow up visit.  Once she is here she can have her labs drawn.

## 2019-05-24 ENCOUNTER — Encounter: Payer: Self-pay | Admitting: Family Medicine

## 2019-05-24 ENCOUNTER — Ambulatory Visit (INDEPENDENT_AMBULATORY_CARE_PROVIDER_SITE_OTHER): Payer: Medicare Other | Admitting: Family Medicine

## 2019-05-24 ENCOUNTER — Other Ambulatory Visit: Payer: Self-pay

## 2019-05-24 VITALS — BP 120/82 | Temp 97.3°F | Ht 70.0 in | Wt 237.6 lb

## 2019-05-24 DIAGNOSIS — E039 Hypothyroidism, unspecified: Secondary | ICD-10-CM

## 2019-05-24 DIAGNOSIS — I10 Essential (primary) hypertension: Secondary | ICD-10-CM | POA: Diagnosis not present

## 2019-05-24 DIAGNOSIS — Z23 Encounter for immunization: Secondary | ICD-10-CM | POA: Diagnosis not present

## 2019-05-24 DIAGNOSIS — E8881 Metabolic syndrome: Secondary | ICD-10-CM | POA: Diagnosis not present

## 2019-05-24 DIAGNOSIS — E7800 Pure hypercholesterolemia, unspecified: Secondary | ICD-10-CM

## 2019-05-24 DIAGNOSIS — E2839 Other primary ovarian failure: Secondary | ICD-10-CM

## 2019-05-24 DIAGNOSIS — E559 Vitamin D deficiency, unspecified: Secondary | ICD-10-CM

## 2019-05-24 DIAGNOSIS — E88819 Insulin resistance, unspecified: Secondary | ICD-10-CM

## 2019-05-24 LAB — COMPREHENSIVE METABOLIC PANEL
ALT: 16 U/L (ref 0–35)
AST: 19 U/L (ref 0–37)
Albumin: 4 g/dL (ref 3.5–5.2)
Alkaline Phosphatase: 87 U/L (ref 39–117)
BUN: 12 mg/dL (ref 6–23)
CO2: 28 mEq/L (ref 19–32)
Calcium: 9.6 mg/dL (ref 8.4–10.5)
Chloride: 104 mEq/L (ref 96–112)
Creatinine, Ser: 0.85 mg/dL (ref 0.40–1.20)
GFR: 66.92 mL/min (ref >60.00)
Glucose, Bld: 85 mg/dL (ref 70–99)
Potassium: 4.5 mEq/L (ref 3.5–5.1)
Sodium: 140 mEq/L (ref 135–145)
Total Bilirubin: 0.5 mg/dL (ref 0.2–1.2)
Total Protein: 7 g/dL (ref 6.0–8.3)

## 2019-05-24 LAB — CBC WITH DIFFERENTIAL/PLATELET
Basophils Absolute: 0.2 10*3/uL — ABNORMAL HIGH (ref 0.0–0.1)
Basophils Relative: 2.5 % (ref 0.0–3.0)
Eosinophils Absolute: 0.2 10*3/uL (ref 0.0–0.7)
Eosinophils Relative: 2 % (ref 0.0–5.0)
HCT: 42.1 % (ref 36.0–46.0)
Hemoglobin: 13.9 g/dL (ref 12.0–15.0)
Lymphocytes Relative: 21.9 % (ref 12.0–46.0)
Lymphs Abs: 2 10*3/uL (ref 0.7–4.0)
MCHC: 33 g/dL (ref 30.0–36.0)
MCV: 82.6 fl (ref 78.0–100.0)
Monocytes Absolute: 0.6 10*3/uL (ref 0.1–1.0)
Monocytes Relative: 6.3 % (ref 3.0–12.0)
Neutro Abs: 6.2 10*3/uL (ref 1.4–7.7)
Neutrophils Relative %: 67.3 % (ref 43.0–77.0)
Platelets: 267 10*3/uL (ref 150.0–400.0)
RBC: 5.09 Mil/uL (ref 3.87–5.11)
RDW: 14.2 % (ref 11.5–15.5)
WBC: 9.2 10*3/uL (ref 4.0–10.5)

## 2019-05-24 LAB — TSH: TSH: 0.05 u[IU]/mL — ABNORMAL LOW (ref 0.35–4.50)

## 2019-05-24 LAB — LIPID PANEL
Cholesterol: 222 mg/dL — ABNORMAL HIGH (ref 0–200)
HDL: 46.7 mg/dL (ref >39.00)
LDL Cholesterol: 146 mg/dL — ABNORMAL HIGH (ref 0–99)
NonHDL: 175.33
Total CHOL/HDL Ratio: 5
Triglycerides: 147 mg/dL (ref 0.0–149.0)
VLDL: 29.4 mg/dL (ref 0.0–40.0)

## 2019-05-24 LAB — HEMOGLOBIN A1C: Hgb A1c MFr Bld: 5.7 % (ref 4.6–6.5)

## 2019-05-24 LAB — T4, FREE: Free T4: 1.3 ng/dL (ref 0.60–1.60)

## 2019-05-24 LAB — VITAMIN D 25 HYDROXY (VIT D DEFICIENCY, FRACTURES): VITD: 21.78 ng/mL — ABNORMAL LOW (ref 30.00–100.00)

## 2019-05-24 NOTE — Progress Notes (Signed)
Tina Dalton is a 68 y.o. female is here for follow up.  History of Present Illness:   HPI: See Assessment and Plan section for Problem Based Charting of issues discussed today.   Health Maintenance Due  Topic Date Due  . MAMMOGRAM  06/03/2003  . DEXA SCAN  06/02/2018   Depression screen PHQ 2/9 05/24/2019 10/06/2017  Decreased Interest 0 0  Down, Depressed, Hopeless 0 0  PHQ - 2 Score 0 0  Altered sleeping 0 -  Tired, decreased energy 0 -  Change in appetite 0 -  Feeling bad or failure about yourself  0 -  Trouble concentrating 0 -  Moving slowly or fidgety/restless 0 -  Suicidal thoughts 0 -  PHQ-9 Score 0 -   PMHx, SurgHx, SocialHx, FamHx, Medications, and Allergies were reviewed in the Visit Navigator and updated as appropriate.   Patient Active Problem List   Diagnosis Date Noted  . Insulin resistance 05/29/2019  . Estrogen deficiency 05/29/2019  . Vitamin D deficiency 05/29/2019  . Acquired hypothyroidism 11/17/2017  . Pure hypercholesterolemia 11/17/2017  . Arthralgia of multiple joints 11/17/2017  . Essential hypertension 11/17/2017   Social History   Tobacco Use  . Smoking status: Never Smoker  . Smokeless tobacco: Never Used  Substance Use Topics  . Alcohol use: Not on file  . Drug use: Not on file   Current Medications and Allergies   Current Outpatient Medications:  .  fluticasone (FLONASE) 50 MCG/ACT nasal spray, Place 2 sprays into both nostrils daily., Disp: 16 g, Rfl: 6 .  levothyroxine (SYNTHROID) 150 MCG tablet, Take 1 tablet (150 mcg total) by mouth daily. Patient need follow up for further refills., Disp: 90 tablet, Rfl: 0 .  Cholecalciferol 1.25 MG (50000 UT) TABS, 50,000 units PO qwk for 12 weeks., Disp: 12 tablet, Rfl: 0  No Known Allergies Review of Systems   Pertinent items are noted in the HPI. Otherwise, a complete ROS is negative.  Vitals   Vitals:   05/24/19 1337  BP: 120/82  Temp: (!) 97.3 F (36.3 C)  Weight: 237 lb 9.6 oz  (107.8 kg)  Height: 5\' 10"  (1.778 m)     Body mass index is 34.09 kg/m.  Physical Exam   Physical Exam Vitals signs and nursing note reviewed.  HENT:     Head: Normocephalic and atraumatic.  Eyes:     Pupils: Pupils are equal, round, and reactive to light.  Neck:     Musculoskeletal: Normal range of motion and neck supple.  Cardiovascular:     Rate and Rhythm: Normal rate and regular rhythm.     Heart sounds: Normal heart sounds.  Pulmonary:     Effort: Pulmonary effort is normal.  Abdominal:     Palpations: Abdomen is soft.  Skin:    General: Skin is warm.  Psychiatric:        Behavior: Behavior normal.     Results for orders placed or performed in visit on 05/24/19  CBC with Differential/Platelet  Result Value Ref Range   WBC 9.2 4.0 - 10.5 K/uL   RBC 5.09 3.87 - 5.11 Mil/uL   Hemoglobin 13.9 12.0 - 15.0 g/dL   HCT 16.142.1 09.636.0 - 04.546.0 %   MCV 82.6 78.0 - 100.0 fl   MCHC 33.0 30.0 - 36.0 g/dL   RDW 40.914.2 81.111.5 - 91.415.5 %   Platelets 267.0 150.0 - 400.0 K/uL   Neutrophils Relative % 67.3 43.0 - 77.0 %   Lymphocytes Relative 21.9  12.0 - 46.0 %   Monocytes Relative 6.3 3.0 - 12.0 %   Eosinophils Relative 2.0 0.0 - 5.0 %   Basophils Relative 2.5 0.0 - 3.0 %   Neutro Abs 6.2 1.4 - 7.7 K/uL   Lymphs Abs 2.0 0.7 - 4.0 K/uL   Monocytes Absolute 0.6 0.1 - 1.0 K/uL   Eosinophils Absolute 0.2 0.0 - 0.7 K/uL   Basophils Absolute 0.2 (H) 0.0 - 0.1 K/uL  Comprehensive metabolic panel  Result Value Ref Range   Sodium 140 135 - 145 mEq/L   Potassium 4.5 3.5 - 5.1 mEq/L   Chloride 104 96 - 112 mEq/L   CO2 28 19 - 32 mEq/L   Glucose, Bld 85 70 - 99 mg/dL   BUN 12 6 - 23 mg/dL   Creatinine, Ser 1.65 0.40 - 1.20 mg/dL   Total Bilirubin 0.5 0.2 - 1.2 mg/dL   Alkaline Phosphatase 87 39 - 117 U/L   AST 19 0 - 37 U/L   ALT 16 0 - 35 U/L   Total Protein 7.0 6.0 - 8.3 g/dL   Albumin 4.0 3.5 - 5.2 g/dL   Calcium 9.6 8.4 - 53.7 mg/dL   GFR 48.27 >07.86 mL/min  Lipid panel  Result  Value Ref Range   Cholesterol 222 (H) 0 - 200 mg/dL   Triglycerides 754.4 0.0 - 149.0 mg/dL   HDL 92.01 >00.71 mg/dL   VLDL 21.9 0.0 - 75.8 mg/dL   LDL Cholesterol 832 (H) 0 - 99 mg/dL   Total CHOL/HDL Ratio 5    NonHDL 175.33   Hemoglobin A1c  Result Value Ref Range   Hgb A1c MFr Bld 5.7 4.6 - 6.5 %  T4, free  Result Value Ref Range   Free T4 1.30 0.60 - 1.60 ng/dL  TSH  Result Value Ref Range   TSH 0.05 (L) 0.35 - 4.50 uIU/mL  VITAMIN D 25 Hydroxy (Vit-D Deficiency, Fractures)  Result Value Ref Range   VITD 21.78 (L) 30.00 - 100.00 ng/mL   Assessment and Plan   Munni was seen today for follow-up.  Diagnoses and all orders for this visit:  Acquired hypothyroidism Comments: Labs today indicating over treatment. Will decrease dose slightly.  Orders: -     T4, free -     TSH  Vitamin D deficiency Comments: Labs today. Low, will send in high dose supplements and encourage continued supplementation at 2000 IU daily.  Orders: -     VITAMIN D 25 Hydroxy (Vit-D Deficiency, Fractures) -     Cholecalciferol 1.25 MG (50000 UT) TABS; 50,000 units PO qwk for 12 weeks.  Pure hypercholesterolemia Comments: Not on statin. ASCVD% < 7. LDL 146. Orders: -     Comprehensive metabolic panel -     Lipid panel  Need for immunization against influenza -     Flu Vaccine QUAD High Dose(Fluad)  Essential hypertension Comments: At goal. Continue current treatment.  Orders: -     CBC with Differential/Platelet  Estrogen deficiency  Insulin resistance Comments: Mild. A1c at 5.7.  Orders: -     Hemoglobin A1c   . Orders and follow up as documented in EpicCare, reviewed diet, exercise and weight control, cardiovascular risk and specific lipid/LDL goals reviewed, reviewed medications and side effects in detail.  . Reviewed expectations re: course of current medical issues. . Outlined signs and symptoms indicating need for more acute intervention. . Patient verbalized  understanding and all questions were answered. . Patient received an After Visit Summary.  Briscoe Deutscher, DO [place], [facility] 05/29/2019

## 2019-05-24 NOTE — Patient Instructions (Signed)
Health Maintenance Due  Topic Date Due  . MAMMOGRAM  06/03/2003  . DEXA SCAN  06/02/2018  . PNA vac Low Risk Adult (1 of 2 - PCV13) 06/02/2018  . INFLUENZA VACCINE  04/22/2019    Depression screen [facility] 2/9 05/24/2019 10/06/2017  Decreased Interest 0 0  Down, Depressed, Hopeless 0 0  PHQ - 2 Score 0 0  Altered sleeping 0 -  Tired, decreased energy 0 -  Change in appetite 0 -  Feeling bad or failure about yourself  0 -  Trouble concentrating 0 -  Moving slowly or fidgety/restless 0 -  Suicidal thoughts 0 -  PHQ-9 Score 0 -

## 2019-05-29 ENCOUNTER — Encounter: Payer: Self-pay | Admitting: Family Medicine

## 2019-05-29 DIAGNOSIS — E88819 Insulin resistance, unspecified: Secondary | ICD-10-CM | POA: Insufficient documentation

## 2019-05-29 DIAGNOSIS — E559 Vitamin D deficiency, unspecified: Secondary | ICD-10-CM | POA: Insufficient documentation

## 2019-05-29 DIAGNOSIS — E2839 Other primary ovarian failure: Secondary | ICD-10-CM | POA: Insufficient documentation

## 2019-05-29 DIAGNOSIS — E8881 Metabolic syndrome: Secondary | ICD-10-CM | POA: Insufficient documentation

## 2019-05-29 MED ORDER — LEVOTHYROXINE SODIUM 137 MCG PO TABS: 137.0000 ug | ORAL_TABLET | Freq: Every day | ORAL | 11 refills | Status: DC

## 2019-05-29 MED ORDER — CHOLECALCIFEROL 1.25 MG (50000 UT) PO TABS: ORAL_TABLET | ORAL | 0 refills | Status: DC

## 2019-07-10 DIAGNOSIS — H40003 Preglaucoma, unspecified, bilateral: Secondary | ICD-10-CM | POA: Diagnosis not present

## 2019-07-10 DIAGNOSIS — H25813 Combined forms of age-related cataract, bilateral: Secondary | ICD-10-CM | POA: Diagnosis not present

## 2019-07-10 DIAGNOSIS — H43811 Vitreous degeneration, right eye: Secondary | ICD-10-CM | POA: Diagnosis not present

## 2019-08-15 ENCOUNTER — Other Ambulatory Visit: Payer: Self-pay | Admitting: Family Medicine

## 2019-08-15 DIAGNOSIS — E559 Vitamin D deficiency, unspecified: Secondary | ICD-10-CM

## 2019-09-01 ENCOUNTER — Ambulatory Visit: Payer: Medicare Other | Admitting: Family Medicine

## 2019-09-12 ENCOUNTER — Other Ambulatory Visit: Payer: Self-pay | Admitting: Family Medicine

## 2019-09-12 DIAGNOSIS — E039 Hypothyroidism, unspecified: Secondary | ICD-10-CM

## 2019-11-01 ENCOUNTER — Encounter: Payer: Medicare Other | Admitting: Family Medicine

## 2020-01-02 ENCOUNTER — Ambulatory Visit (INDEPENDENT_AMBULATORY_CARE_PROVIDER_SITE_OTHER): Payer: Medicare Other | Admitting: Family Medicine

## 2020-01-02 ENCOUNTER — Other Ambulatory Visit: Payer: Self-pay

## 2020-01-02 ENCOUNTER — Telehealth: Payer: Self-pay | Admitting: Family Medicine

## 2020-01-02 ENCOUNTER — Encounter: Payer: Self-pay | Admitting: Family Medicine

## 2020-01-02 VITALS — BP 138/90 | HR 79 | Temp 97.1°F | Resp 16 | Ht 70.0 in | Wt 200.0 lb

## 2020-01-02 DIAGNOSIS — E039 Hypothyroidism, unspecified: Secondary | ICD-10-CM

## 2020-01-02 DIAGNOSIS — Z96653 Presence of artificial knee joint, bilateral: Secondary | ICD-10-CM

## 2020-01-02 DIAGNOSIS — R011 Cardiac murmur, unspecified: Secondary | ICD-10-CM

## 2020-01-02 DIAGNOSIS — Z23 Encounter for immunization: Secondary | ICD-10-CM

## 2020-01-02 DIAGNOSIS — R55 Syncope and collapse: Secondary | ICD-10-CM | POA: Diagnosis not present

## 2020-01-02 DIAGNOSIS — E2839 Other primary ovarian failure: Secondary | ICD-10-CM

## 2020-01-02 DIAGNOSIS — Z1231 Encounter for screening mammogram for malignant neoplasm of breast: Secondary | ICD-10-CM

## 2020-01-02 DIAGNOSIS — R030 Elevated blood-pressure reading, without diagnosis of hypertension: Secondary | ICD-10-CM | POA: Diagnosis not present

## 2020-01-02 DIAGNOSIS — M19042 Primary osteoarthritis, left hand: Secondary | ICD-10-CM

## 2020-01-02 HISTORY — DX: Cardiac murmur, unspecified: R01.1

## 2020-01-02 HISTORY — DX: Presence of artificial knee joint, bilateral: Z96.653

## 2020-01-02 LAB — CBC WITH DIFFERENTIAL/PLATELET
Basophils Absolute: 0.1 10*3/uL (ref 0.0–0.1)
Basophils Relative: 1 % (ref 0.0–3.0)
Eosinophils Absolute: 0.3 10*3/uL (ref 0.0–0.7)
Eosinophils Relative: 3.4 % (ref 0.0–5.0)
HCT: 41.9 % (ref 36.0–46.0)
Hemoglobin: 13.8 g/dL (ref 12.0–15.0)
Lymphocytes Relative: 24.8 % (ref 12.0–46.0)
Lymphs Abs: 1.9 10*3/uL (ref 0.7–4.0)
MCHC: 32.9 g/dL (ref 30.0–36.0)
MCV: 85.8 fl (ref 78.0–100.0)
Monocytes Absolute: 0.5 10*3/uL (ref 0.1–1.0)
Monocytes Relative: 6.7 % (ref 3.0–12.0)
Neutro Abs: 4.9 10*3/uL (ref 1.4–7.7)
Neutrophils Relative %: 64.1 % (ref 43.0–77.0)
Platelets: 221 10*3/uL (ref 150.0–400.0)
RBC: 4.89 Mil/uL (ref 3.87–5.11)
RDW: 14.3 % (ref 11.5–15.5)
WBC: 7.7 10*3/uL (ref 4.0–10.5)

## 2020-01-02 LAB — COMPREHENSIVE METABOLIC PANEL
ALT: 12 U/L (ref 0–35)
AST: 16 U/L (ref 0–37)
Albumin: 4.1 g/dL (ref 3.5–5.2)
Alkaline Phosphatase: 105 U/L (ref 39–117)
BUN: 14 mg/dL (ref 6–23)
CO2: 29 mEq/L (ref 19–32)
Calcium: 9.7 mg/dL (ref 8.4–10.5)
Chloride: 105 mEq/L (ref 96–112)
Creatinine, Ser: 0.7 mg/dL (ref 0.40–1.20)
GFR: 83.57 mL/min (ref >60.00)
Glucose, Bld: 97 mg/dL (ref 70–99)
Potassium: 4.5 mEq/L (ref 3.5–5.1)
Sodium: 140 mEq/L (ref 135–145)
Total Bilirubin: 0.5 mg/dL (ref 0.2–1.2)
Total Protein: 6.8 g/dL (ref 6.0–8.3)

## 2020-01-02 LAB — TSH: TSH: 0.11 u[IU]/mL — ABNORMAL LOW (ref 0.35–4.50)

## 2020-01-02 MED ORDER — SHINGRIX 50 MCG/0.5ML IM SUSR: 0.5000 mL | Freq: Once | INTRAMUSCULAR | 0 refills | Status: AC

## 2020-01-02 MED ORDER — DICLOFENAC SODIUM 1 % EX GEL: 2.0000 g | Freq: Four times a day (QID) | CUTANEOUS | 3 refills | Status: DC | PRN

## 2020-01-02 NOTE — Patient Instructions (Addendum)
Please return in September 2021 for your annual complete physical; please come fasting.  Great work on your [employer] loss!! Add Runner, broadcasting/film/video now.  I have ordered an echocardiogram of your heart to evaluate your valves due to the murmur and recent fainting episode. We will call you to get that scheduled.   Today you were given your prevnar (pneumonia) and 1st of 2 Shingrix (shngles) vaccinations.   It was a pleasure meeting you today! Thank you for choosing [place] to meet your healthcare needs! I truly look forward to working with you. If you have any questions or concerns, please send me a message via Mychart or call the office at [phone].  I have ordered a mammogram and/or bone density for you as we discussed today: [x]   Mammogram  [x]   Bone Density  Please call the office checked below to schedule your appointment: Your appointment will at the following location  [x]   [facility]      [address] [place],        [address]         []   [facility]  [address] [place],  [facility]  Make sure to wear two peace clothing  No lotions powders or deodorants the day of the appointment Make sure to bring picture ID and insurance card.  Bring list of medications you are currently taking including any supplements.    Heart Murmur A heart murmur is an extra sound that is caused by chaotic blood flow through the valves of the heart. The murmur can be heard as a "hum" or "whoosh" sound when blood flows through the heart. There are two types of heart murmurs:  Innocent (benign) murmurs. Most people with this type of heart murmur do not have a heart problem. Many children have innocent heart murmurs. Your health care provider may suggest some basic tests to find out whether your murmur is an innocent murmur. If an innocent heart murmur is found, there is no need for further tests or treatment and no need to restrict  activities or stop playing sports.  Abnormal murmurs. These types of murmurs can occur in children and adults. Abnormal murmurs may be a sign of a more serious heart condition, such as a heart defect present at birth (congenital defect) or heart valve disease. What are the causes?  The heart has four areas called chambers. Valves separate the upper and lower chambers from each other (tricuspid valve and mitral valve) and separate the lower chambers of the heart from pathways that lead away from the heart (aortic valve and pulmonary valve). Normally, the valves open to let blood flow through or out of your heart, and then they shut to keep the blood from flowing backward. This condition is caused by heart valves that are not working properly.  In children, abnormal heart murmurs are typically caused by congenital defects.  In adults, abnormal murmurs are usually caused by heart valve problems from disease, infection, or aging. This condition may also be caused by:  Pregnancy.  Fever.  Overactive thyroid gland.  Anemia.  Exercise.  Rapid growth spurts (in children). What are the signs or symptoms? Innocent murmurs do not cause symptoms, and many people with abnormal murmurs may not have symptoms. If symptoms do develop, they may include:  Shortness of breath.  Blue coloring of the skin, especially on the fingertips.  Chest pain.  Palpitations, or  feeling a fluttering or skipped heartbeat.  Fainting.  Persistent cough.  Getting tired much faster than expected.  Swelling in the abdomen, feet, or ankles. How is this diagnosed? This condition may be diagnosed during a routine physical or other exam. If your health care provider hears a murmur with a stethoscope, he or she will listen for:  Where the murmur is located in your heart.  How long the murmur lasts (duration).  When the murmur is heard during the heartbeat.  How loud the murmur is. This may help the health care  provider figure out what is causing the murmur. You may be referred to a heart specialist (cardiologist). You may also have other tests, including:  Electrocardiogram (ECG or EKG). This test measures the electrical activity of your heart.  Echocardiogram. This test uses high frequency sound waves to make pictures of your heart.  MRI or chest X-ray.  Cardiac catheterization. This test looks at blood flow through the arteries around the heart. For children and adults who have an abnormal heart murmur and want to stay active, it is important to:  Complete testing.  Review test results.  Receive recommendations from your health care provider. If heart disease is present, it may not be safe to play or be active. How is this treated? Heart murmurs themselves do not need treatment. In some cases, a heart murmur may go away on its own. If an underlying problem or disease is causing the murmur, you may need treatment. If treatment is needed, it will depend on the type and severity of the disease or heart problem causing the murmur. Treatment may include:  Medicine.  Surgery.  Dietary and lifestyle changes. Follow these instructions at home:  Talk with your health care provider before participating in sports or other activities that require a lot of effort and energy (are strenuous).  Learn as much as possible about your condition and any related diseases. Ask your health care provider if you may be at risk for any medical emergencies.  Talk with your health care provider about what symptoms you should look out for.  It is up to you to get your test results. Ask your health care provider, or the department that is doing the test, when your results will be ready.  Keep all follow-up visits as told by your health care provider. This is important. Contact a health care provider if:  You are frequently short of breath.  You feel more tired than usual.  You are having a hard time keeping  up with normal activities or fitness routines.  You have swelling in your ankles or feet.  You notice that your heart often beats irregularly.  You develop any new symptoms. Get help right away if:  You have chest pain.  You are having trouble breathing.  You feel light-headed or you pass out.  Your symptoms suddenly get worse. These symptoms may represent a serious problem that is an emergency. Do not wait to see if the symptoms will go away. Get medical help right away. Call your local emergency services (911 in the [place] Do not drive yourself to the hospital. Summary  Normally, the heart valves open to let blood flow through or out of your heart, and then they shut to keep the blood from flowing backward.  A heart murmur is caused by heart valves that are not working properly.  You may need treatment if an underlying problem or disease is causing the heart murmur. Treatment may include  medicine, surgery, or dietary and lifestyle changes.  Talk with your health care provider before participating in sports or other activities that require a lot of effort and energy (are strenuous).  Talk with your health care provider about what symptoms you should watch out for. This information is not intended to replace advice given to you by your health care provider. Make sure you discuss any questions you have with your health care provider. Document Revised: 03/01/2018 Document Reviewed: 03/01/2018 Elsevier Patient Education  2020 [employer].

## 2020-01-02 NOTE — Telephone Encounter (Signed)
Echo - CPT [id number]  Medicare doesn't require a prior auth for any advanced imaging.  [employer] - As part of our Prior Authorization Reduction program, [employer] no longer requires prior authorization for CT, MRI/MRA and transthoracic echocardiography procedures for Medicare members effective 09/21/2016

## 2020-01-02 NOTE — Progress Notes (Signed)
Subjective  CC:  Chief Complaint  Patient presents with  . Transitions Of Care    requesting mammogram and DEXA, two total knee replacements - states she needs xrayed every two years - currently due  . Hypothyroidism    HPI: Tina Dalton is a 69 y.o. female who presents to the office today to address the problems listed above in the chief complaint.  Very pleasant divorced, single mother of grown daughter with one granddaugter, retired female who relocated here in 2018 from [place] (originally from [place]) to be closer to daughter presents to establish care. Overall, she is very healthy. Has lost 40 pounds in last year and is walking regularly for exercise. Feels well overall.   OA: has 1st MCP joint OA; s/p bilateral knee replacements and does well w/o pain.   Low thyroid: feeling well w/o sxs of low or high thyroid. Compliant with meds. Dose had been adjusted in last 1-2 years.   Prehypertension: + FH of HTN. No premature CAD. No cp, sob, le edema  Syncopal episode about 2 weeks ago while standing in line at [employer]; very hot, hungry and had been walking for a few hours. Had prodromal lightheadedness and felt flushed. Was out for about 15 seconds. No prior episodes of fainting. No lightheadedness in past. No palpitations or cp. She thought it was related to the heat. She denies being told she has had a heart murmur in the past.   HM: due prevnar, shingrix; had covid. cologuard is up to date. Due mammo and dexa.   Assessment  1. Acquired hypothyroidism   2. Prehypertension   3. Heart murmur, systolic   4. Syncope, unspecified syncope type   5. Encounter for screening mammogram for breast cancer   6. Hypoestrogenism   7. 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 History of bilateral knee replacement      Plan    Prehypertension f/u: education given. rec low salt diet and continued weight loss. Will monitor  Hypothyroidism f/u: clinically  euthyroid; check today  Syncope and heart murmur: likely vasovagal episode but will get echocardiogram to evaluate for structural heart disease and valves.   OA: voltaren gel as needed for hand pain  HM: ordered mammo and dexa. Updated prevnar and RX printed for prevnar.   Education regarding management of these chronic disease states was given. Management strategies discussed on successive visits include dietary and exercise recommendations, goals of achieving and maintaining IBW, and lifestyle modifications aiming for adequate sleep and minimizing stressors.   Follow up: Return in about 5 months (around 06/03/2020) for complete physical.  Orders Placed This Encounter  Procedures  . MM DIGITAL SCREENING BILATERAL  . DG Bone Density  . Pneumococcal conjugate vaccine 13-valent IM  . CBC with Differential/Platelet  . TSH  . Comprehensive metabolic panel  . ECHOCARDIOGRAM COMPLETE   Meds ordered this encounter  Medications  . Zoster Vaccine Adjuvanted [facility]) injection    Sig: Inject 0.5 mLs into the muscle once for 1 dose. Please give 2nd dose 2-6 months after first dose    Dispense:  2 each    Refill:  0  . diclofenac Sodium (VOLTAREN) 1 % GEL    Sig: Apply 2 g topically 4 (four) times daily as needed.    Dispense:  100 g    Refill:  3      BP Readings from Last 3 Encounters:  01/02/20 138/90  05/24/19 120/82  06/17/18 120/84  Wt Readings from Last 3 Encounters:  01/02/20 200 lb (90.7 kg)  05/24/19 237 lb 9.6 oz (107.8 kg)  06/17/18 242 lb 6.4 oz (110 kg)    Lab Results  Component Value Date   CHOL 222 (H) 05/24/2019   CHOL 214 (H) 06/17/2018   CHOL 318 (H) 10/06/2017   Lab Results  Component Value Date   HDL 46.70 05/24/2019   HDL 48.80 06/17/2018   HDL 72.20 10/06/2017   Lab Results  Component Value Date   LDLCALC 146 (H) 05/24/2019   LDLCALC 217 (H) 10/06/2017   Lab Results  Component Value Date   TRIG 147.0 05/24/2019   TRIG 230.0 (H) 06/17/2018    TRIG 144.0 10/06/2017   Lab Results  Component Value Date   CHOLHDL 5 05/24/2019   CHOLHDL 4 06/17/2018   CHOLHDL 4 10/06/2017   Lab Results  Component Value Date   LDLDIRECT 139.0 06/17/2018   Lab Results  Component Value Date   CREATININE 0.85 05/24/2019   BUN 12 05/24/2019   NA 140 05/24/2019   K 4.5 05/24/2019   CL 104 05/24/2019   CO2 28 05/24/2019    The 10-year ASCVD risk score Mikey Bussing DC Jr., et al., 2013) is: 8%   Values used to calculate the score:     Age: 70 years     Sex: Female     Is Non-Hispanic African American: No     Diabetic: No     Tobacco smoker: No     Systolic Blood Pressure: 952 mmHg     Is BP treated: No     HDL Cholesterol: 46.7 mg/dL     Total Cholesterol: 222 mg/dL  I reviewed the patients updated PMH, FH, and SocHx.    Patient Active Problem List   Diagnosis Date Noted  . Acquired hypothyroidism 11/17/2017    Priority: High  . Prehypertension 11/17/2017    Priority: High  . 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 History of bilateral knee replacement 01/02/2020    Priority: Low  . Vitamin D deficiency 05/29/2019    Priority: Low  . Heart murmur, systolic 84/13/2440    Allergies: Patient has no known allergies.  Social History: Patient  reports that she has never smoked. She has never used smokeless tobacco. She reports current alcohol use. She reports that she does not use drugs.  Current Meds  Medication Sig  . levothyroxine (SYNTHROID) 137 MCG tablet Take 1 tablet (137 mcg total) by mouth daily before breakfast.    Review of Systems: Cardiovascular: negative for chest pain, palpitations, leg swelling, orthopnea Respiratory: negative for SOB, wheezing or persistent cough Gastrointestinal: negative for abdominal pain Genitourinary: negative for dysuria or gross hematuria  Objective  Vitals: BP 138/90   Pulse 79   Temp (!) 97.1 F (36.2 C) (Temporal)   Resp 16    Ht 5\' 10"  (1.778 m)   Wt 200 lb (90.7 kg)   LMP  (LMP Unknown)   SpO2 99%   BMI 28.70 kg/m  General: no acute distress  Psych:  Alert and oriented, normal mood and affect HEENT:  Normocephalic, atraumatic, supple neck , no goiter Cardiovascular:  RRR with 2/6 systolic murmur at RUSB and LUSB. No radiation. No peripheral edema Respiratory:  Good breath sounds bilaterally, CTAB with normal respiratory effort Skin:  Warm, no rashes Neurologic:   Mental status is normal  Commons side effects, risks, benefits, and alternatives for medications  and treatment plan prescribed today were discussed, and the patient expressed understanding of the given instructions. Patient is instructed to call or message via MyChart if he/she has any questions or concerns regarding our treatment plan. No barriers to understanding were identified. We discussed Red Flag symptoms and signs in detail. Patient expressed understanding regarding what to do in case of urgent or emergency type symptoms.   Medication list was reconciled, printed and provided to the patient in AVS. Patient instructions and summary information was reviewed with the patient as documented in the AVS. This note was prepared with assistance of Dragon voice recognition software. Occasional wrong-word or sound-a-like substitutions may have occurred due to the inherent limitations of voice recognition software  This visit occurred during the SARS-CoV-2 public health emergency.  Safety protocols were in place, including screening questions prior to the visit, additional usage of staff PPE, and extensive cleaning of exam room while observing appropriate contact time as indicated for disinfecting solutions.

## 2020-01-03 ENCOUNTER — Other Ambulatory Visit: Payer: Self-pay

## 2020-01-03 DIAGNOSIS — E039 Hypothyroidism, unspecified: Secondary | ICD-10-CM

## 2020-01-10 ENCOUNTER — Telehealth: Payer: Self-pay

## 2020-01-10 ENCOUNTER — Other Ambulatory Visit: Payer: Self-pay

## 2020-01-10 MED ORDER — LEVOTHYROXINE SODIUM 125 MCG PO TABS: 125.0000 ug | ORAL_TABLET | Freq: Every day | ORAL | 3 refills | Status: DC

## 2020-01-10 NOTE — Telephone Encounter (Signed)
Patient states that she was told by the Dr.Andy that the dosage for levothyroxine (SYNTHROID) 137 MCG tablet would decrease. Patient states that she picked up the prescription today and its still the same dosage. Please follow up with patient.

## 2020-01-10 NOTE — Telephone Encounter (Signed)
MyChart message sent to patient to inform her that new dosage of Levothyroxine sent to pharmacy

## 2020-01-17 ENCOUNTER — Ambulatory Visit (HOSPITAL_COMMUNITY): Payer: Medicare Other | Attending: Cardiovascular Disease

## 2020-01-17 ENCOUNTER — Other Ambulatory Visit: Payer: Self-pay

## 2020-01-17 DIAGNOSIS — R55 Syncope and collapse: Secondary | ICD-10-CM | POA: Diagnosis not present

## 2020-01-17 DIAGNOSIS — R011 Cardiac murmur, unspecified: Secondary | ICD-10-CM

## 2020-02-28 ENCOUNTER — Other Ambulatory Visit (INDEPENDENT_AMBULATORY_CARE_PROVIDER_SITE_OTHER): Payer: Medicare Other

## 2020-02-28 ENCOUNTER — Other Ambulatory Visit: Payer: Self-pay

## 2020-02-28 DIAGNOSIS — E039 Hypothyroidism, unspecified: Secondary | ICD-10-CM | POA: Diagnosis not present

## 2020-02-28 LAB — TSH: TSH: 0.18 u[IU]/mL — ABNORMAL LOW (ref 0.35–4.50)

## 2020-02-28 LAB — T4, FREE: Free T4: 1.26 ng/dL (ref 0.60–1.60)

## 2020-03-01 MED ORDER — LEVOTHYROXINE SODIUM 112 MCG PO TABS: 112.0000 ug | ORAL_TABLET | Freq: Every day | ORAL | 3 refills | Status: DC

## 2020-03-15 ENCOUNTER — Ambulatory Visit
Admission: RE | Admit: 2020-03-15 | Discharge: 2020-03-15 | Disposition: A | Discharge status: Home / Self Care | Payer: Medicare Other | Source: Ambulatory Visit | Attending: Family Medicine | Admitting: Family Medicine

## 2020-03-15 ENCOUNTER — Other Ambulatory Visit: Payer: Self-pay

## 2020-03-15 DIAGNOSIS — M8589 Other specified disorders of bone density and structure, multiple sites: Secondary | ICD-10-CM | POA: Diagnosis not present

## 2020-03-15 DIAGNOSIS — Z1231 Encounter for screening mammogram for malignant neoplasm of breast: Secondary | ICD-10-CM | POA: Diagnosis not present

## 2020-03-15 DIAGNOSIS — E2839 Other primary ovarian failure: Secondary | ICD-10-CM

## 2020-03-15 DIAGNOSIS — Z780 Asymptomatic menopausal state: Secondary | ICD-10-CM | POA: Diagnosis not present

## 2020-03-30 ENCOUNTER — Other Ambulatory Visit: Payer: Self-pay | Admitting: Family Medicine

## 2020-03-30 DIAGNOSIS — E039 Hypothyroidism, unspecified: Secondary | ICD-10-CM

## 2020-04-01 ENCOUNTER — Encounter: Payer: Self-pay | Admitting: Family Medicine

## 2020-04-01 ENCOUNTER — Ambulatory Visit (INDEPENDENT_AMBULATORY_CARE_PROVIDER_SITE_OTHER): Payer: Medicare Other

## 2020-04-01 DIAGNOSIS — Z780 Asymptomatic menopausal state: Secondary | ICD-10-CM | POA: Insufficient documentation

## 2020-04-01 DIAGNOSIS — Z0000 Encounter for general adult medical examination without abnormal findings: Secondary | ICD-10-CM | POA: Diagnosis not present

## 2020-04-01 DIAGNOSIS — M8580 Other specified disorders of bone density and structure, unspecified site: Secondary | ICD-10-CM

## 2020-04-01 HISTORY — DX: Other specified disorders of bone density and structure, unspecified site: M85.80

## 2020-04-01 HISTORY — DX: Asymptomatic menopausal state: Z78.0

## 2020-04-01 NOTE — Progress Notes (Signed)
Virtual Visit via Telephone Note  I connected with  Tina Dalton on 04/01/20 at  1:00 PM EDT by telephone and verified that I am speaking with the correct person using two identifiers.  Medicare Annual Wellness visit completed telephonically due to Covid-19 pandemic.   Persons participating in this call: This Health Coach and this patient.   Location: Patient: Home Provider: Office   I discussed the limitations, risks, security and privacy concerns of performing an evaluation and management service by telephone and the availability of in person appointments. The patient expressed understanding and agreed to proceed.  Unable to perform video visit due to video visit attempted and failed and/or patient does not have video capability.   Some vital signs may be absent or patient reported.   [name], LPN    Subjective:   Tina Dalton is a 69 y.o. female who presents for an Initial Medicare Annual Wellness Visit.  Review of Systems     Cardiac Risk Factors include: advanced age (>67men, >18 women)     Objective:    There were no vitals filed for this visit. There is no height or weight on file to calculate BMI.  Advanced Directives 04/01/2020  Does Patient Have a Medical Advance Directive? Yes  Type of Estate agent of [employer];Living will  Copy of Healthcare Power of Attorney in Chart? No - copy requested    Current Medications (verified) Outpatient Encounter Medications as of 04/01/2020  Medication Sig  . levothyroxine (SYNTHROID) 112 MCG tablet Take 1 tablet (112 mcg total) by mouth daily before breakfast.  . Cholecalciferol 1.25 MG (50000 UT) TABS 50,000 units PO qwk for 12 weeks. (Patient not taking: Reported on 01/02/2020)  . diclofenac Sodium (VOLTAREN) 1 % GEL Apply 2 g topically 4 (four) times daily as needed. (Patient not taking: Reported on 04/01/2020)  . fluticasone (FLONASE) 50 MCG/ACT nasal spray Place 2 sprays into both nostrils daily.  (Patient not taking: Reported on 01/02/2020)   No facility-administered encounter medications on file as of 04/01/2020.    Allergies (verified) Patient has no known allergies.   History: Past Medical History:  Diagnosis Date  . Heart murmur, systolic 01/02/2020  . History of bilateral knee replacement 01/02/2020  . Hypothyroidism   . Osteoarthritis of knee   . Osteopenia after menopause 04/01/2020   DEXA July 2020: Lowest T score equals -1.9   Past Surgical History:  Procedure Laterality Date  . S/P Knee replacement   03/2015   Family History  Problem Relation Age of Onset  . Hypertension Father   . Cancer Father        Skin  . Coronary artery disease Father   . Alzheimer's disease Mother    Social History   Socioeconomic History  . Marital status: Single    Spouse name: Not on file  . Number of children: Not on file  . Years of education: Not on file  . Highest education level: Not on file  Occupational History  . Not on file  Tobacco Use  . Smoking status: Never Smoker  . Smokeless tobacco: Never Used  Vaping Use  . Vaping Use: Never used  Substance and Sexual Activity  . Alcohol use: Yes  . Drug use: Never  . Sexual activity: Yes  Other Topics Concern  . Not on file  Social History Narrative  . Not on file   Social Determinants of Health   Financial Resource Strain: Low Risk   . Difficulty of Paying  Living Expenses: Not hard at all  Food Insecurity: No Food Insecurity  . Worried About Programme researcher, broadcasting/film/video in the Last Year: Never true  . Ran Out of Food in the Last Year: Never true  Transportation Needs: No Transportation Needs  . Lack of Transportation (Medical): No  . Lack of Transportation (Non-Medical): No  Physical Activity: Sufficiently Active  . Days of Exercise per Week: 7 days  . Minutes of Exercise per Session: 40 min  Stress: No Stress Concern Present  . Feeling of Stress : Not at all  Social Connections: Socially Isolated  . Frequency of  Communication with Friends and Family: More than three times a week  . Frequency of Social Gatherings with Friends and Family: More than three times a week  . Attends Religious Services: Never  . Active Member of Clubs or Organizations: No  . Attends Banker Meetings: Never  . Marital Status: Divorced    Tobacco Counseling Counseling given: Not Answered   Clinical Intake:  Pre-visit preparation completed: Yes  Pain : No/denies pain     Nutritional Risks: None Diabetes: No  How often do you need to have someone help you when you read instructions, pamphlets, or other written materials from your doctor or pharmacy?: 1 - Never  Diabetic?No  Interpreter Needed?: No  Information entered by :: Lanier Ensign, LPN   Activities of Daily Living In your present state of health, do you have any difficulty performing the following activities: 04/01/2020 01/02/2020  Hearing? Y N  Comment right ear feels muted at times -  Vision? N N  Difficulty concentrating or making decisions? N N  Walking or climbing stairs? N N  Dressing or bathing? N N  Doing errands, shopping? N N  Preparing Food and eating ? N -  Using the Toilet? N -  In the past six months, have you accidently leaked urine? N -  Do you have problems with loss of bowel control? N -  Managing your Medications? N -  Managing your Finances? N -  Housekeeping or managing your Housekeeping? N -  Some recent data might be hidden    Patient Care Team: Willow Ora, MD as PCP - General (Family Medicine)  Indicate any recent Medical Services you may have received from other than [facility] providers in the past year (date may be approximate).     Assessment:   This is a routine wellness examination for Tina Dalton.  Hearing/Vision screen  Hearing Screening   125Hz  250Hz  500Hz  1000Hz  2000Hz  3000Hz  4000Hz  6000Hz  8000Hz   Right ear:           Left ear:           Comments: Pt denies any hearing difficulty, states she  feels right ear is muted at times  Vision Screening Comments: Pt wears eyeglasses and will need to follow up with Eye Dr   Dietary issues and exercise activities discussed: Current Exercise Habits: Home exercise routine, Type of exercise: walking, Time (Minutes): 40, Frequency (Times/Week): 7, Weekly Exercise (Minutes/Week): 280, Intensity: Moderate, Exercise limited by: cardiac condition(s)  Goals    . Patient Stated     Stay healthy and go on vacation and lose 5 more pounds before trip      Depression Screen PHQ 2/9 Scores 04/01/2020 05/24/2019 10/06/2017  PHQ - 2 Score 0 0 0  PHQ- 9 Score - 0 -    Fall Risk Fall Risk  04/01/2020 01/02/2020 10/06/2017  Falls in the past  year? 1 0 Yes  Number falls in past yr: 1 0 -  Injury with Fall? 1 0 -  Comment hurt right hand , didn't recieve any medical treatment - -  Risk for fall due to : Impaired vision - -  Follow up Falls prevention discussed - -    Any stairs in or around the home? Yes  If so, are there any without handrails? No  Home free of loose throw rugs in walkways, pet beds, electrical cords, etc? Yes  Adequate lighting in your home to reduce risk of falls? Yes   ASSISTIVE DEVICES UTILIZED TO PREVENT FALLS:  Life alert? No  Use of a cane, walker or w/c? No  Grab bars in the bathroom? No  Shower chair or bench in shower? No  Elevated toilet seat or a handicapped toilet? Yes     Cognitive Function:     6CIT Screen 04/01/2020  What Year? 0 points  What month? 0 points  Count back from 20 0 points  Months in reverse 0 points  Repeat phrase 0 points    Immunizations Immunization History  Administered Date(s) Administered  . Fluad Quad(high Dose 65+) 05/24/2019  . Influenza, High Dose Seasonal PF 06/17/2018  . Influenza,inj,Quad PF,6+ Mos 10/06/2017  . PFIZER SARS-COV-2 Vaccination 10/11/2019, 11/01/2019  . Pneumococcal Conjugate-13 01/02/2020  . Tdap 11/17/2017    TDAP status: Up to date Flu Vaccine status: Up  to date Pneumococcal vaccine status: Declined,  Education has been provided regarding the importance of this vaccine but patient still declined. Advised may receive this vaccine at local pharmacy or Health Dept. Aware to provide a copy of the vaccination record if obtained from local pharmacy or Health Dept. Verbalized acceptance and understanding.  Covid-19 vaccine status: Completed vaccines  Qualifies for Shingles Vaccine? Yes   Zostavax completed No   Shingrix Completed?: No.    Education has been provided regarding the importance of this vaccine. Patient has been advised to call insurance company to determine out of pocket expense if they have not yet received this vaccine. Advised may also receive vaccine at local pharmacy or Health Dept. Verbalized acceptance and understanding.  Screening Tests Health Maintenance  Topic Date Due  . MAMMOGRAM  Never done  . INFLUENZA VACCINE  04/21/2020  . Fecal DNA (Cologuard)  10/27/2020  . PNA vac Low Risk Adult (2 of 2 - PPSV23) 01/01/2021  . DEXA SCAN  03/15/2022  . TETANUS/TDAP  11/18/2027  . COVID-19 Vaccine  Completed  . Hepatitis C Screening  Completed    Health Maintenance  Health Maintenance Due  Topic Date Due  . MAMMOGRAM  Never done    Colorectal cancer screening: Completed 10/27/17. Repeat every 3 years (cologuard) Mammogram status: Completed 01/02/20. Repeat every year Bone Density status: Completed 03/15/20. Results reflect: Bone density results: OSTEOPOROSIS. Repeat every 3 years.    Additional Screening:  Hepatitis C Screening: Completed 06/17/18  Vision Screening: Recommended annual ophthalmology exams for early detection of glaucoma and other disorders of the eye. Is the patient up to date with their annual eye exam?  No  Who is the provider or what is the name of the office in which the patient attends annual eye exams? Will follow up with Eye Doctor   Dental Screening: Recommended annual dental exams for proper oral  hygiene  Community Resource Referral / Chronic Care Management: CRR required this visit?  No   CCM required this visit?  No      Plan:  I have personally reviewed and noted the following in the patient's chart:   . Medical and social history . Use of alcohol, tobacco or illicit drugs  . Current medications and supplements . Functional ability and status . Nutritional status . Physical activity . Advanced directives . List of other physicians . Hospitalizations, surgeries, and ER visits in previous 12 months . Vitals . Screenings to include cognitive, depression, and falls . Referrals and appointments  In addition, I have reviewed and discussed with patient certain preventive protocols, quality metrics, and best practice recommendations. A written personalized care plan for preventive services as well as general preventive health recommendations were provided to patient.     [name], LPN   [id number]   Nurse Notes: None

## 2020-04-01 NOTE — Patient Instructions (Addendum)
Ms. Tina Dalton , Thank you for taking time to come for your Medicare Wellness Visit. I appreciate your ongoing commitment to your health goals. Please review the following plan we discussed and let me know if I can assist you in the future.   Screening recommendations/referrals: Colonoscopy: Cologuard 10/27/17  Recommended yearly ophthalmology/optometry visit for glaucoma screening and checkup Recommended yearly dental visit for hygiene and checkup  Vaccinations: Influenza vaccine: Up to date Pneumococcal vaccine: due  Tdap vaccine: Up to date Shingles vaccine: Shingrix discussed. Please contact your pharmacy for coverage information.    Covid-19: Completed 1/20 & 11/01/19  Advanced directives: Please bring a copy of your health care power of attorney and living will to the office at your convenience.   Conditions/risks identified: Stay healthy and lose 5lbs before vacation trip.  Next appointment: Follow up in one year for your annual wellness visit.   Preventive Care 67 Years and Older, Female Preventive care refers to lifestyle choices and visits with your health care provider that can promote health and wellness. What does preventive care include?  A yearly physical exam. This is also called an annual well check.  Dental exams once or twice a year.  Routine eye exams. Ask your health care provider how often you should have your eyes checked.  Personal lifestyle choices, including:  Daily care of your teeth and gums.  Regular physical activity.  Eating a healthy diet.  Avoiding tobacco and drug use.  Limiting alcohol use.  Practicing safe sex.  Taking low doses of aspirin every day.  Taking vitamin and mineral supplements as recommended by your health care provider. What happens during an annual well check? The services and screenings done by your health care provider during your annual well check will depend on your age, overall health, lifestyle risk factors, and family  history of disease. Counseling  Your health care provider may ask you questions about your:  Alcohol use.  Tobacco use.  Drug use.  Emotional well-being.  Home and relationship well-being.  Sexual activity.  Eating habits.  History of falls.  Memory and ability to understand (cognition).  Work and work Astronomer. Screening  You may have the following tests or measurements:  Height, weight, and BMI.  Blood pressure.  Lipid and cholesterol levels. These may be checked every 5 years, or more frequently if you are over 67 years old.  Skin check.  Lung cancer screening. You may have this screening every year starting at age 67 if you have a 30-pack-year history of smoking and currently smoke or have quit within the past 15 years.  Fecal occult blood test (FOBT) of the stool. You may have this test every year starting at age 67.  Flexible sigmoidoscopy or colonoscopy. You may have a sigmoidoscopy every 5 years or a colonoscopy every 10 years starting at age 67.  Prostate cancer screening. Recommendations will vary depending on your family history and other risks.  Hepatitis C blood test.  Hepatitis B blood test.  Sexually transmitted disease (STD) testing.  Diabetes screening. This is done by checking your blood sugar (glucose) after you have not eaten for a while (fasting). You may have this done every 1-3 years.  Abdominal aortic aneurysm (AAA) screening. You may need this if you are a current or former smoker.  Osteoporosis. You may be screened starting at age 67 if you are at high risk. Talk with your health care provider about your test results, treatment options, and if necessary, the need for  more tests. Vaccines  Your health care provider may recommend certain vaccines, such as:  Influenza vaccine. This is recommended every year.  Tetanus, diphtheria, and acellular pertussis (Tdap, Td) vaccine. You may need a Td booster every 10 years.  Zoster vaccine.  You may need this after age 67.  Pneumococcal 13-valent conjugate (PCV13) vaccine. One dose is recommended after age 67.  Pneumococcal polysaccharide (PPSV23) vaccine. One dose is recommended after age 67. Talk to your health care provider about which screenings and vaccines you need and how often you need them. This information is not intended to replace advice given to you by your health care provider. Make sure you discuss any questions you have with your health care provider. Document Released: 10/04/2015 Document Revised: 05/27/2016 Document Reviewed: 07/09/2015 Elsevier Interactive Patient Education  2017 [place] Prevention in the Home Falls can cause injuries. They can happen to people of all ages. There are many things you can do to make your home safe and to help prevent falls. What can I do on the outside of my home?  Regularly fix the edges of walkways and driveways and fix any cracks.  Remove anything that might make you trip as you walk through a door, such as a raised step or threshold.  Trim any bushes or trees on the path to your home.  Use bright outdoor lighting.  Clear any walking paths of anything that might make someone trip, such as rocks or tools.  Regularly check to see if handrails are loose or broken. Make sure that both sides of any steps have handrails.  Any raised decks and porches should have guardrails on the edges.  Have any leaves, snow, or ice cleared regularly.  Use sand or salt on walking paths during winter.  Clean up any spills in your garage right away. This includes oil or grease spills. What can I do in the bathroom?  Use night lights.  Install grab bars by the toilet and in the tub and shower. Do not use towel bars as grab bars.  Use non-skid mats or decals in the tub or shower.  If you need to sit down in the shower, use a plastic, non-slip stool.  Keep the floor dry. Clean up any water that spills on the floor as soon  as it happens.  Remove soap buildup in the tub or shower regularly.  Attach bath mats securely with double-sided non-slip rug tape.  Do not have throw rugs and other things on the floor that can make you trip. What can I do in the bedroom?  Use night lights.  Make sure that you have a light by your bed that is easy to reach.  Do not use any sheets or blankets that are too big for your bed. They should not hang down onto the floor.  Have a firm chair that has side arms. You can use this for support while you get dressed.  Do not have throw rugs and other things on the floor that can make you trip. What can I do in the kitchen?  Clean up any spills right away.  Avoid walking on wet floors.  Keep items that you use a lot in easy-to-reach places.  If you need to reach something above you, use a strong step stool that has a grab bar.  Keep electrical cords out of the way.  Do not use floor polish or wax that makes floors slippery. If you must use wax, use non-skid  floor wax.  Do not have throw rugs and other things on the floor that can make you trip. What can I do with my stairs?  Do not leave any items on the stairs.  Make sure that there are handrails on both sides of the stairs and use them. Fix handrails that are broken or loose. Make sure that handrails are as long as the stairways.  Check any carpeting to make sure that it is firmly attached to the stairs. Fix any carpet that is loose or worn.  Avoid having throw rugs at the top or bottom of the stairs. If you do have throw rugs, attach them to the floor with carpet tape.  Make sure that you have a light switch at the top of the stairs and the bottom of the stairs. If you do not have them, ask someone to add them for you. What else can I do to help prevent falls?  Wear shoes that:  Do not have high heels.  Have rubber bottoms.  Are comfortable and fit you well.  Are closed at the toe. Do not wear sandals.  If  you use a stepladder:  Make sure that it is fully opened. Do not climb a closed stepladder.  Make sure that both sides of the stepladder are locked into place.  Ask someone to hold it for you, if possible.  Clearly mark and make sure that you can see:  Any grab bars or handrails.  First and last steps.  Where the edge of each step is.  Use tools that help you move around (mobility aids) if they are needed. These include:  Canes.  Walkers.  Scooters.  Crutches.  Turn on the lights when you go into a dark area. Replace any light bulbs as soon as they burn out.  Set up your furniture so you have a clear path. Avoid moving your furniture around.  If any of your floors are uneven, fix them.  If there are any pets around you, be aware of where they are.  Review your medicines with your doctor. Some medicines can make you feel dizzy. This can increase your chance of falling. Ask your doctor what other things that you can do to help prevent falls. This information is not intended to replace advice given to you by your health care provider. Make sure you discuss any questions you have with your health care provider. Document Released: 07/04/2009 Document Revised: 02/13/2016 Document Reviewed: 10/12/2014 Elsevier Interactive Patient Education  2017 [employer].

## 2020-04-24 ENCOUNTER — Encounter: Payer: Self-pay | Admitting: Family Medicine

## 2020-06-19 ENCOUNTER — Encounter: Payer: Medicare Other | Admitting: Family Medicine

## 2020-07-12 ENCOUNTER — Other Ambulatory Visit: Payer: Self-pay

## 2020-07-12 ENCOUNTER — Encounter: Payer: Self-pay | Admitting: Family Medicine

## 2020-07-12 ENCOUNTER — Ambulatory Visit (INDEPENDENT_AMBULATORY_CARE_PROVIDER_SITE_OTHER): Payer: Medicare Other | Admitting: Family Medicine

## 2020-07-12 VITALS — BP 112/80 | HR 74 | Temp 97.7°F | Resp 15 | Ht 70.0 in | Wt 197.6 lb

## 2020-07-12 DIAGNOSIS — E039 Hypothyroidism, unspecified: Secondary | ICD-10-CM | POA: Diagnosis not present

## 2020-07-12 MED ORDER — ZOSTER VAC RECOMB ADJUVANTED 50 MCG/0.5ML IM SUSR: 0.5000 mL | Freq: Once | INTRAMUSCULAR | 0 refills | Status: AC

## 2020-07-12 NOTE — Addendum Note (Signed)
Addended by: Laddie Aquas A on: 07/12/2020 08:19 AM   Modules accepted: Orders

## 2020-07-12 NOTE — Progress Notes (Signed)
Subjective  CC:  Chief Complaint  Patient presents with  . Hypothyroidism    HPI: Tina Dalton is a 69 y.o. female who presents to the office today to address the problems listed above in the chief complaint.  Hypothyroidism follow-up: Last checked in checked in June.  See below.  We decreased her dose 125 212 mcg daily.  She feels fine.  No concerns  Due flu vaccine today  Lab Results  Component Value Date   TSH 0.18 (L) 02/28/2020    Assessment  1. Acquired hypothyroidism      Plan   Hypothyroidism: Recheck TSH to see if levels are now at goal.  Clinically euthyroid  Flu vaccine updated  Will return for CPE in 3 months  Follow up: Return for CPE Visit date not found  Orders Placed This Encounter  Procedures  . TSH   No orders of the defined types were placed in this encounter.     I reviewed the patients updated PMH, FH, and SocHx.    Patient Active Problem List   Diagnosis Date Noted  . Acquired hypothyroidism 11/17/2017    Priority: High  . Prehypertension 11/17/2017    Priority: High  . 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 History of bilateral knee replacement 01/02/2020    Priority: Low  . Vitamin D deficiency 05/29/2019    Priority: Low  . Osteopenia after menopause 04/01/2020  . Heart murmur, systolic 01/02/2020   Current Meds  Medication Sig  . Cholecalciferol 1.25 MG (50000 UT) TABS 50,000 units PO qwk for 12 weeks.  . diclofenac Sodium (VOLTAREN) 1 % GEL Apply 2 g topically 4 (four) times daily as needed.  . fluticasone (FLONASE) 50 MCG/ACT nasal spray Place 2 sprays into both nostrils daily.  Marland Kitchen levothyroxine (SYNTHROID) 112 MCG tablet Take 1 tablet (112 mcg total) by mouth daily before breakfast.    Allergies: Patient has No Known Allergies. Family History: Patient family history includes Alzheimer's disease in her mother; Cancer in her father; Coronary artery disease in her  father; Hypertension in her father. Social History:  Patient  reports that she has never smoked. She has never used smokeless tobacco. She reports current alcohol use. She reports that she does not use drugs.  Review of Systems: Constitutional: Negative for fever malaise or anorexia Cardiovascular: negative for chest pain Respiratory: negative for SOB or persistent cough Gastrointestinal: negative for abdominal pain  Objective  Vitals: BP 112/80   Pulse 74   Temp 97.7 F (36.5 C) (Temporal)   Resp 15   Ht 5\' 10"  (1.778 m)   Wt 197 lb 9.6 oz (89.6 kg)   LMP  (LMP Unknown)   SpO2 98%   BMI 28.35 kg/m  General: no acute distress , A&Ox3 HEENT: PEERL, conjunctiva normal, neck is supple Cardiovascular:  RRR without murmur or gallop.  Respiratory:  Good breath sounds bilaterally, CTAB with normal respiratory effort Skin:  Warm, no rashes No tremor     Commons side effects, risks, benefits, and alternatives for medications and treatment plan prescribed today were discussed, and the patient expressed understanding of the given instructions. Patient is instructed to call or message via MyChart if he/she has any questions or concerns regarding our treatment plan. No barriers to understanding were identified. We discussed Red Flag symptoms and signs in detail. Patient expressed understanding regarding what to do in case of urgent or emergency type symptoms.   Medication list  was reconciled, printed and provided to the patient in AVS. Patient instructions and summary information was reviewed with the patient as documented in the AVS. This note was prepared with assistance of Dragon voice recognition software. Occasional wrong-word or sound-a-like substitutions may have occurred due to the inherent limitations of voice recognition software  This visit occurred during the SARS-CoV-2 public health emergency.  Safety protocols were in place, including screening questions prior to the visit,  additional usage of staff PPE, and extensive cleaning of exam room while observing appropriate contact time as indicated for disinfecting solutions.

## 2020-07-12 NOTE — Patient Instructions (Addendum)
Please return in 3 months for your annual complete physical; please come fasting.  I will release your lab results to you on your MyChart account with further instructions. Please reply with any questions.   If you have any questions or concerns, please don't hesitate to send me a message via MyChart or call the office at [phone]. Thank you for visiting with us today! It's our pleasure caring for you.   

## 2020-07-13 LAB — TSH: TSH: 2.13 mIU/L (ref 0.40–4.50)

## 2020-07-22 DIAGNOSIS — H40003 Preglaucoma, unspecified, bilateral: Secondary | ICD-10-CM | POA: Diagnosis not present

## 2020-07-22 DIAGNOSIS — H25813 Combined forms of age-related cataract, bilateral: Secondary | ICD-10-CM | POA: Diagnosis not present

## 2020-09-21 HISTORY — PX: CATARACT EXTRACTION W/ INTRAOCULAR LENS IMPLANT: SHX1309

## 2020-12-01 ENCOUNTER — Emergency Department (HOSPITAL_COMMUNITY)
Admission: EM | Admit: 2020-12-01 | Discharge: 2020-12-01 | Disposition: A | Discharge status: Home / Self Care | Payer: Medicare Other | Attending: Emergency Medicine | Admitting: Emergency Medicine

## 2020-12-01 ENCOUNTER — Emergency Department (HOSPITAL_COMMUNITY): Payer: Medicare Other

## 2020-12-01 ENCOUNTER — Encounter (HOSPITAL_COMMUNITY): Payer: Self-pay

## 2020-12-01 DIAGNOSIS — Y999 Unspecified external cause status: Secondary | ICD-10-CM | POA: Insufficient documentation

## 2020-12-01 DIAGNOSIS — M25531 Pain in right wrist: Secondary | ICD-10-CM | POA: Diagnosis not present

## 2020-12-01 DIAGNOSIS — E039 Hypothyroidism, unspecified: Secondary | ICD-10-CM | POA: Diagnosis not present

## 2020-12-01 DIAGNOSIS — W0110XA Fall on same level from slipping, tripping and stumbling with subsequent striking against unspecified object, initial encounter: Secondary | ICD-10-CM | POA: Insufficient documentation

## 2020-12-01 DIAGNOSIS — Z23 Encounter for immunization: Secondary | ICD-10-CM | POA: Insufficient documentation

## 2020-12-01 DIAGNOSIS — R519 Headache, unspecified: Secondary | ICD-10-CM | POA: Insufficient documentation

## 2020-12-01 DIAGNOSIS — S0081XA Abrasion of other part of head, initial encounter: Secondary | ICD-10-CM | POA: Diagnosis not present

## 2020-12-01 DIAGNOSIS — W19XXXA Unspecified fall, initial encounter: Secondary | ICD-10-CM

## 2020-12-01 DIAGNOSIS — M25532 Pain in left wrist: Secondary | ICD-10-CM | POA: Diagnosis not present

## 2020-12-01 DIAGNOSIS — S0993XA Unspecified injury of face, initial encounter: Secondary | ICD-10-CM | POA: Diagnosis present

## 2020-12-01 DIAGNOSIS — Z79899 Other long term (current) drug therapy: Secondary | ICD-10-CM | POA: Insufficient documentation

## 2020-12-01 DIAGNOSIS — M7989 Other specified soft tissue disorders: Secondary | ICD-10-CM | POA: Diagnosis not present

## 2020-12-01 DIAGNOSIS — Y9301 Activity, walking, marching and hiking: Secondary | ICD-10-CM | POA: Insufficient documentation

## 2020-12-01 DIAGNOSIS — S0990XA Unspecified injury of head, initial encounter: Secondary | ICD-10-CM | POA: Diagnosis not present

## 2020-12-01 DIAGNOSIS — Y929 Unspecified place or not applicable: Secondary | ICD-10-CM | POA: Diagnosis not present

## 2020-12-01 DIAGNOSIS — R072 Precordial pain: Secondary | ICD-10-CM | POA: Diagnosis not present

## 2020-12-01 DIAGNOSIS — S0011XA Contusion of right eyelid and periocular area, initial encounter: Secondary | ICD-10-CM | POA: Diagnosis not present

## 2020-12-01 MED ORDER — TETANUS-DIPHTH-ACELL PERTUSSIS 5-2.5-18.5 LF-MCG/0.5 IM SUSY
0.5000 mL | PREFILLED_SYRINGE | Freq: Once | INTRAMUSCULAR | Status: AC
  Administered 2020-12-01: 0.5 mL via INTRAMUSCULAR
  Filled 2020-12-01: qty 0.5

## 2020-12-01 NOTE — Discharge Instructions (Signed)
You were seen today for fall, your imaging is reassuring.  Please follow-up with your primary care in the next couple of days.  Your tetanus was updated today.  If you have any new worsening concerning symptoms such as trouble walking, vision changes, worsening headache please come back to the emergency department.  I hope you feel better!  In regards to your rest you can ice this area, elevate it and take Tylenol for the pain.

## 2020-12-01 NOTE — ED Triage Notes (Signed)
Pt presents with c/o fall that occurred last night. Pt reports she fell forward onto asphalt, no LOC. Pt has some redness and abrasions around her right eye.

## 2020-12-01 NOTE — ED Provider Notes (Signed)
[facility]-EMERGENCY DEPT Provider Note   CSN: [id number] Arrival date & time: 12/01/20  1135     History Chief Complaint  Patient presents with  . Fall    Tina Dalton is a 70 y.o. female with a past medical history of osteoarthritis the presents the emerge department today for fall that occurred last night with daughter.  Patient states that she was walking, tripped and fell on her shoes and face planted.  Patient states that she fell first on her knees she thinks and then hit her head.  Patient did not lose consciousness.  Patient states that her glasses did crack and she is having some pain and does have abrasions to the left side of her face.  Denies any vision pain, blurry vision, photophobia, vision loss.  Normal EOMs with no pain when she moves her eyes.  Patient is not sure if she is up-to-date on tetanus.  Patient is also complaining of some left wrist pain, thinks that she put her hand down when she fell, however states that happens if that she does not remember.  Denies any numbness and tingling into her hand, did have a prior surgery over 60 years ago to that wrist.  Unsure if there is any hardware in that wrist.  Denies any pain elsewhere, no pain to her knees.  Has been walking normally.  Is not on any blood thinners.  No other complaints at this time.  HPI     Past Medical History:  Diagnosis Date  . Heart murmur, systolic 01/02/2020  . History of bilateral knee replacement 01/02/2020  . Hypothyroidism   . Osteoarthritis of knee   . Osteopenia after menopause 04/01/2020   DEXA July 2020: Lowest T score equals -1.9    Patient Active Problem List   Diagnosis Date Noted  . Osteopenia after menopause 04/01/2020  . Heart murmur, systolic 01/02/2020  . 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 History of bilateral knee replacement 01/02/2020  . Vitamin D deficiency 05/29/2019  . Acquired hypothyroidism 11/17/2017  .  Prehypertension 11/17/2017    Past Surgical History:  Procedure Laterality Date  . S/P Knee replacement   03/2015     OB History   No obstetric history on file.     Family History  Problem Relation Age of Onset  . Hypertension Father   . Cancer Father        Skin  . Coronary artery disease Father   . Alzheimer's disease Mother     Social History   Tobacco Use  . Smoking status: Never Smoker  . Smokeless tobacco: Never Used  Vaping Use  . Vaping Use: Never used  Substance Use Topics  . Alcohol use: Yes  . Drug use: Never    Home Medications Prior to Admission medications   Medication Sig Start Date End Date Taking? Authorizing Provider  Cholecalciferol 1.25 MG (50000 UT) TABS 50,000 units PO qwk for 12 weeks. 05/29/19   Helane Rima, DO  diclofenac Sodium (VOLTAREN) 1 % GEL Apply 2 g topically 4 (four) times daily as needed. 01/02/20   Willow Ora, MD  fluticasone (FLONASE) 50 MCG/ACT nasal spray Place 2 sprays into both nostrils daily. 06/09/18   Orland Mustard, MD  levothyroxine (SYNTHROID) 112 MCG tablet Take 1 tablet (112 mcg total) by mouth daily before breakfast. 03/01/20   Willow Ora, MD    Allergies    Patient has no known allergies.  Review of  Systems   Review of Systems  Constitutional: Negative for chills, diaphoresis, fatigue and fever.  HENT: Negative for congestion, sore throat and trouble swallowing.   Eyes: Negative for pain and visual disturbance.  Respiratory: Negative for cough, shortness of breath and wheezing.   Cardiovascular: Negative for chest pain, palpitations and leg swelling.  Gastrointestinal: Negative for abdominal distention, abdominal pain, diarrhea, nausea and vomiting.  Genitourinary: Negative for difficulty urinating.  Musculoskeletal: Positive for arthralgias. Negative for back pain, neck pain and neck stiffness.  Skin: Positive for wound. Negative for pallor.  Neurological: Negative for dizziness, speech difficulty,  weakness and headaches.  Psychiatric/Behavioral: Negative for confusion.    Physical Exam Updated Vital Signs BP 134/81   Pulse 71   Temp 97.7 F (36.5 C) (Oral)   Resp 16   LMP  (LMP Unknown)   SpO2 100%   Physical Exam Constitutional:      General: She is not in acute distress.    Appearance: Normal appearance. She is not ill-appearing, toxic-appearing or diaphoretic.     Comments: Patient peers very well  HENT:     Head: Normocephalic. Abrasion and contusion present. No raccoon eyes, Battle's sign or masses.     Jaw: There is normal jaw occlusion. No trismus.      Comments: Patient does have contusion with some erythema edema and ecchymosis to right eye with some right periorbital swelling.  No crepitus felt.  EOMs intact and are normal.  Abrasions are healed and scabbed over.  No abrasions or crepitus felt.     Mouth/Throat:     Mouth: Mucous membranes are moist.     Pharynx: Oropharynx is clear.  Eyes:     General: No scleral icterus.    Extraocular Movements: Extraocular movements intact.     Pupils: Pupils are equal, round, and reactive to light.  Cardiovascular:     Rate and Rhythm: Normal rate and regular rhythm.     Pulses: Normal pulses.     Heart sounds: Normal heart sounds.  Pulmonary:     Effort: Pulmonary effort is normal. No respiratory distress.     Breath sounds: Normal breath sounds. No stridor. No wheezing, rhonchi or rales.  Chest:     Chest wall: No tenderness.       Comments: Patient with tenderness to sternum, no ecchymosis, no flail chest. Abdominal:     General: Abdomen is flat. There is no distension.     Palpations: Abdomen is soft.     Tenderness: There is no abdominal tenderness. There is no guarding or rebound.  Musculoskeletal:        General: No swelling or tenderness. Normal range of motion.       Hands:     Cervical back: Normal range of motion and neck supple. No rigidity.     Right lower leg: No edema.     Left lower leg: No  edema.     Comments: Patient does have some tenderness to dorsal aspect of the left wrist as seen in picture.  Is able to make a fist with normal strength to wrist and digits.  Normal opposition.  No snuffbox tenderness.  Normal range of motion and strength and sensation throughout.  Cap refill less than 2 seconds.  Radial pulse 2.  No swelling.  No cervical, thoracic or lumbar midline tenderness.  Normal range of motion to back.  Normal range of motion and no tenderness noted elsewhere.  Skin:    General: Skin is warm  and dry.     Capillary Refill: Capillary refill takes less than 2 seconds.     Coloration: Skin is not pale.     Findings: Bruising present.  Neurological:     General: No focal deficit present.     Mental Status: She is alert and oriented to person, place, and time.     Comments: Alert. Clear speech. No facial droop. CNIII-XII grossly intact. Bilateral upper and lower extremities' sensation grossly intact. 5/5 symmetric strength with grip strength and with plantar and dorsi flexion bilaterally.  Normal finger to nose bilaterally. Negative pronator drift.Gait is steady and intact    Psychiatric:        Mood and Affect: Mood normal.        Behavior: Behavior normal.     ED Results / Procedures / Treatments   Labs (all labs ordered are listed, but only abnormal results are displayed) Labs Reviewed - No data to display  EKG None  Radiology DG Sternum  Result Date: 12/01/2020 CLINICAL DATA:  Inferior sternal pain after fall EXAM: STERNUM - 2+ VIEW COMPARISON:  None. FINDINGS: There is no evidence of fracture of the sternum or manubrium. Heart size is normal. Lungs are clear. IMPRESSION: Negative. Electronically Signed   By: Duanne Guess D.O.   On: 12/01/2020 13:09   DG Wrist Complete Left  Result Date: 12/01/2020 CLINICAL DATA:  Left wrist pain after fall EXAM: LEFT WRIST - COMPLETE 3+ VIEW COMPARISON:  None. FINDINGS: There is no evidence of fracture or  dislocation. Carpal bones intact. Severe osteoarthritis of the first [facility] joint. Mild triscaphe joint space narrowing. Mild soft tissue swelling. IMPRESSION: 1. No acute osseous abnormality of the left wrist. 2. Severe osteoarthritis of the first [facility] joint. Electronically Signed   By: Duanne Guess D.O.   On: 12/01/2020 13:12   CT Head Wo Contrast  Result Date: 12/01/2020 CLINICAL DATA:  Fall, hit head on concrete, swelling of the right eye with laceration and bruising EXAM: CT HEAD WITHOUT CONTRAST CT MAXILLOFACIAL WITHOUT CONTRAST TECHNIQUE: Multidetector CT imaging of the head and maxillofacial structures were performed using the standard protocol without intravenous contrast. Multiplanar CT image reconstructions of the maxillofacial structures were also generated. COMPARISON:  None. FINDINGS: CT HEAD FINDINGS Brain: No evidence of acute infarction, hemorrhage, hydrocephalus, extra-axial collection or mass lesion/mass effect. Vascular: No hyperdense vessel or unexpected calcification. Skull: Normal. Negative for fracture or focal lesion. Other: None. CT MAXILLOFACIAL FINDINGS Osseous: No fracture or mandibular dislocation. No destructive process. Orbits: Negative. No traumatic or inflammatory finding. Sinuses: Clear. Soft tissues: Soft tissue contusion overlying the right orbit (series 3, image 25). IMPRESSION: 1. No acute intracranial pathology. 2. No displaced fracture or dislocation of the facial bones. 3. Soft tissue contusion overlying the right orbit. Electronically Signed   By: Lauralyn Primes M.D.   On: 12/01/2020 13:04   CT Maxillofacial WO CM  Result Date: 12/01/2020 CLINICAL DATA:  Fall, hit head on concrete, swelling of the right eye with laceration and bruising EXAM: CT HEAD WITHOUT CONTRAST CT MAXILLOFACIAL WITHOUT CONTRAST TECHNIQUE: Multidetector CT imaging of the head and maxillofacial structures were performed using the standard protocol without intravenous contrast. Multiplanar CT image  reconstructions of the maxillofacial structures were also generated. COMPARISON:  None. FINDINGS: CT HEAD FINDINGS Brain: No evidence of acute infarction, hemorrhage, hydrocephalus, extra-axial collection or mass lesion/mass effect. Vascular: No hyperdense vessel or unexpected calcification. Skull: Normal. Negative for fracture or focal lesion. Other: None. CT MAXILLOFACIAL FINDINGS Osseous: No  fracture or mandibular dislocation. No destructive process. Orbits: Negative. No traumatic or inflammatory finding. Sinuses: Clear. Soft tissues: Soft tissue contusion overlying the right orbit (series 3, image 25). IMPRESSION: 1. No acute intracranial pathology. 2. No displaced fracture or dislocation of the facial bones. 3. Soft tissue contusion overlying the right orbit. Electronically Signed   By: Lauralyn Primes M.D.   On: 12/01/2020 13:04    Procedures Procedures   Medications Ordered in ED Medications  Tdap (BOOSTRIX) injection 0.5 mL (has no administration in time range)    ED Course  I have reviewed the triage vital signs and the nursing notes.  Pertinent labs & imaging results that were available during my care of the patient were reviewed by me and considered in my medical decision making (see chart for details).    MDM Rules/Calculators/A&P                          Tina Dalton is a 70 y.o. female with a past medical history of osteoarthritis the presents the emerge department today for fall that occurred last night with daughter.  Patient does have some abrasions to right eye with some swelling around the eye, no vision changes with normal visual acuity.  No entrapment.  Tetanus updated.  Patient does not want anything for pain at this moment.  CT imaging of head and maxillofacial negative.  Wrist and sternum x-ray negative.  Patient appears very well, to be discharged at this time.  Normal gait, patient will follow up with PCP.  Doubt need for further emergent work up at this time. I  explained the diagnosis and have given explicit precautions to return to the ER including for any other new or worsening symptoms. The patient understands and accepts the medical plan as it's been dictated and I have answered their questions. Discharge instructions concerning home care and prescriptions have been given. The patient is STABLE and is discharged to home in good condition.   Final Clinical Impression(s) / ED Diagnoses Final diagnoses:  Fall, initial encounter    Rx / DC Orders ED Discharge Orders    None       Farrel Gordon, PA-C 12/01/20 1349    Long, Arlyss Repress, MD 12/03/20 1235

## 2020-12-13 ENCOUNTER — Encounter: Payer: Self-pay | Admitting: Family Medicine

## 2020-12-13 ENCOUNTER — Ambulatory Visit (INDEPENDENT_AMBULATORY_CARE_PROVIDER_SITE_OTHER): Payer: Medicare Other | Admitting: Family Medicine

## 2020-12-13 ENCOUNTER — Other Ambulatory Visit: Payer: Self-pay

## 2020-12-13 VITALS — BP 122/68 | HR 77 | Temp 98.2°F | Ht 70.0 in | Wt 195.0 lb

## 2020-12-13 DIAGNOSIS — S0083XA Contusion of other part of head, initial encounter: Secondary | ICD-10-CM | POA: Diagnosis not present

## 2020-12-13 NOTE — Patient Instructions (Signed)
Contusion A contusion is a deep bruise. This is a result of an injury that causes bleeding under the skin. Symptoms of bruising include pain, swelling, and discolored skin. The skin may turn blue, purple, or yellow. Follow these instructions at home: Managing pain, stiffness, and swelling You may use RICE. This stands for:  Resting.  Icing.  Compression, or putting pressure.  Elevating, or raising the injured area. To follow this method, do these actions:  Rest the injured area.  If told, put ice on the injured area. ? Put ice in a plastic bag. ? Place a towel between your skin and the bag. ? Leave the ice on for 20 minutes, 2-3 times per day.  If told, put light pressure (compression) on the injured area using an elastic bandage. Make sure the bandage is not too tight. If the area tingles or becomes numb, remove it and put it back on as told by your doctor.  If possible, raise (elevate) the injured area above the level of your heart while you are sitting or lying down.   General instructions  Take over-the-counter and prescription medicines only as told by your doctor.  Keep all follow-up visits as told by your doctor. This is important. Contact a doctor if:  Your symptoms do not get better after several days of treatment.  Your symptoms get worse.  You have trouble moving the injured area. Get help right away if:  You have very bad pain.  You have a loss of feeling (numbness) in a hand or foot.  Your hand or foot turns pale or cold. Summary  A contusion is a deep bruise. This is a result of an injury that causes bleeding under the skin.  Symptoms of bruising include pain, swelling, and discolored skin. The skin may turn blue, purple, or yellow.  This condition is treated with rest, ice, compression, and elevation. This is also called RICE. You may be given over-the-counter medicines for pain.  Contact a doctor if you do not feel better, or you feel worse. Get  help right away if you have very bad pain, have lost feeling in a hand or foot, or the area turns pale or cold. This information is not intended to replace advice given to you by your health care provider. Make sure you discuss any questions you have with your health care provider. Document Revised: 04/29/2018 Document Reviewed: 04/29/2018 Elsevier Patient Education  2021 Elsevier Inc.  

## 2020-12-13 NOTE — Progress Notes (Signed)
Patient: Tina Dalton MRN: [id number] DOB: [birth year] PCP: Willow Ora, MD     Subjective:  Chief Complaint  Patient presents with  . Fall    Pt fell 2 weeks ago, and has facial injuries.     HPI: The patient is a 69 y.o. female who presents today for a fall 2 weeks ago. Pt says she fell flat on her face. She has a hard place on her eyebrow and just wants to make sure it is okay. She was in [place] and was walking to her car after dinner. She was on the sidewalk and tripped over a branch and fell flat on her face.  She went to the ER on Sunday and had CT of her head and maxillofacial area. She denies any vision changes or headaches. Overall feels good. Her main concern is that the area over her right eye is still sore and feels hard. She wants to make sure she is okay.   CT head: negative CT maxillofacial: no displaced fracture or dislocation of facial bone Soft tissue contusion overlying right orbit.     Review of Systems  Constitutional: Negative for fatigue and fever.  Eyes: Negative for pain and visual disturbance.  Skin: Positive for wound.  Neurological: Negative for dizziness and headaches.    Allergies Patient has No Known Allergies.  Past Medical History Patient  has a past medical history of Heart murmur, systolic (01/02/2020), History of bilateral knee replacement (01/02/2020), Hypothyroidism, Osteoarthritis of knee, and Osteopenia after menopause (04/01/2020).  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  Family History Pateint's family history includes Alzheimer's disease in her mother; Cancer in her father; Coronary artery disease in her father; Hypertension in her father.  Social History Patient  reports that she has never smoked. She has never used smokeless tobacco. She reports current alcohol use. She reports that she does not use drugs.    Objective: Vitals:   12/13/20 1258  BP: 122/68   Pulse: 77  Temp: 98.2 F (36.8 C)  TempSrc: Temporal  SpO2: 97%  Weight: 195 lb (88.5 kg)  Height: 5\' 10"  (1.778 m)    Body mass index is 27.98 kg/m.  Physical Exam Vitals reviewed.  Constitutional:      Appearance: Normal appearance. She is normal weight.  HENT:     Head: Normocephalic and atraumatic.  Eyes:     Extraocular Movements: Extraocular movements intact.     Conjunctiva/sclera: Conjunctivae normal.     Pupils: Pupils are equal, round, and reactive to light.  Pulmonary:     Effort: Pulmonary effort is normal.  Skin:    Comments: Scabbed over lacerations on right lateral aspect of the supraorbital margin. She has mild edema and tenderness over this area.   Neurological:     General: No focal deficit present.     Mental Status: She is alert and oriented to person, place, and time.     Cranial Nerves: No cranial nerve deficit.    ER notes/imaging reviewed.     Assessment/plan: 1. Facial contusion, initial encounter Reassured her this is normal and will take some time to resolve completely. CT face/maxillary reviewed with her as well. F/u as needed.     Return if symptoms worsen or fail to improve.   , MD [address] [facility]   12/13/2020'

## 2021-02-03 ENCOUNTER — Other Ambulatory Visit: Payer: Self-pay | Admitting: Family Medicine

## 2021-02-03 ENCOUNTER — Telehealth: Payer: Self-pay

## 2021-02-03 ENCOUNTER — Other Ambulatory Visit: Payer: Self-pay

## 2021-02-03 MED ORDER — LEVOTHYROXINE SODIUM 112 MCG PO TABS: 112.0000 ug | ORAL_TABLET | Freq: Every day | ORAL | 0 refills | Status: DC

## 2021-02-03 NOTE — Telephone Encounter (Signed)
MEDICATION: levothyroxine (SYNTHROID) 112 MCG tablet  PHARMACY: [facility] [address]  Comments:   **Let patient know to contact pharmacy at the end of the day to make sure medication is ready. **  ** Please notify patient to allow 48-72 hours to process**  **Encourage patient to contact the pharmacy for refills or they can request refills through [facility]**

## 2021-02-03 NOTE — Telephone Encounter (Signed)
Medication has been filled 

## 2021-02-19 ENCOUNTER — Other Ambulatory Visit: Payer: Self-pay | Admitting: Family Medicine

## 2021-02-19 DIAGNOSIS — Z1231 Encounter for screening mammogram for malignant neoplasm of breast: Secondary | ICD-10-CM

## 2021-03-14 IMAGING — CR DG WRIST COMPLETE 3+V*L*
4 series · 4 of 4 positions shown · non-contrast
Comparison: None.

CLINICAL DATA: Left wrist pain after fall

EXAM:
LEFT WRIST - COMPLETE 3+ VIEW

[x wrist pa left]
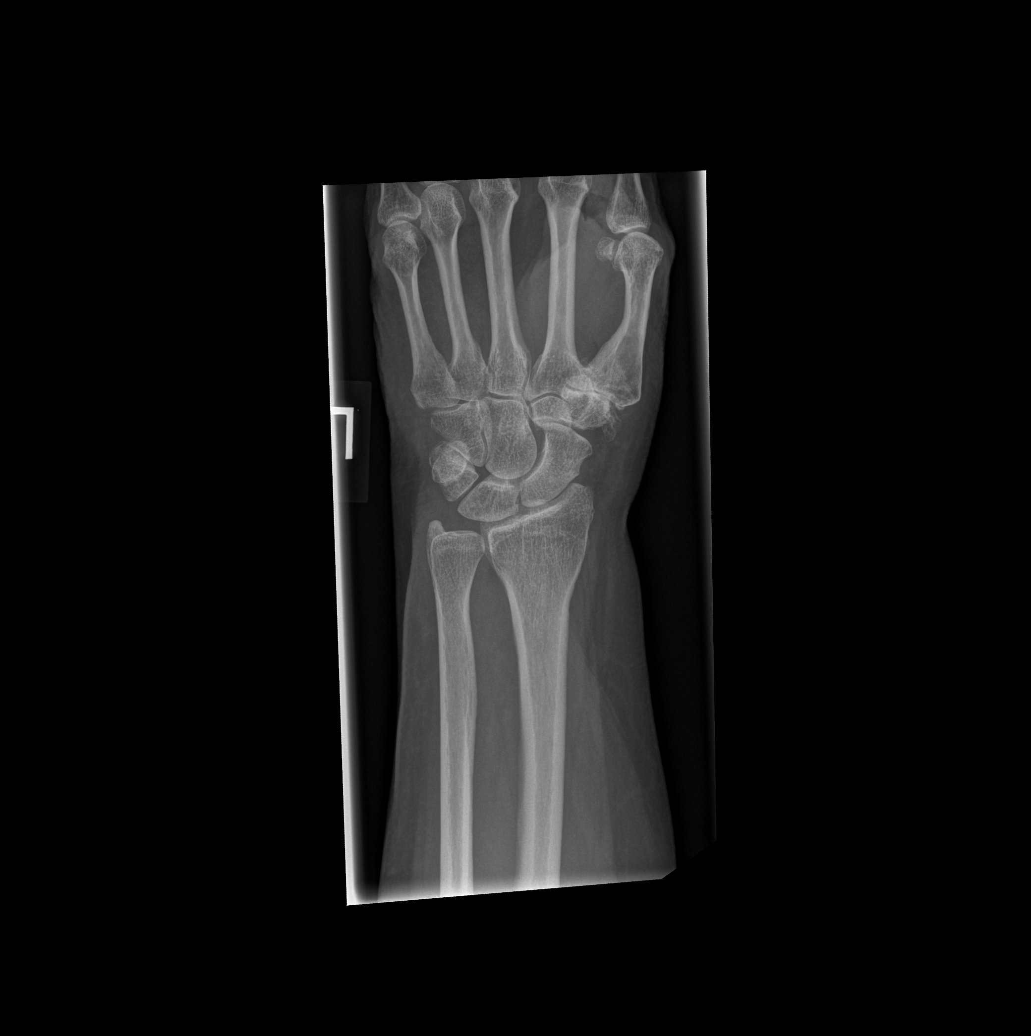

[x wrist obl left]
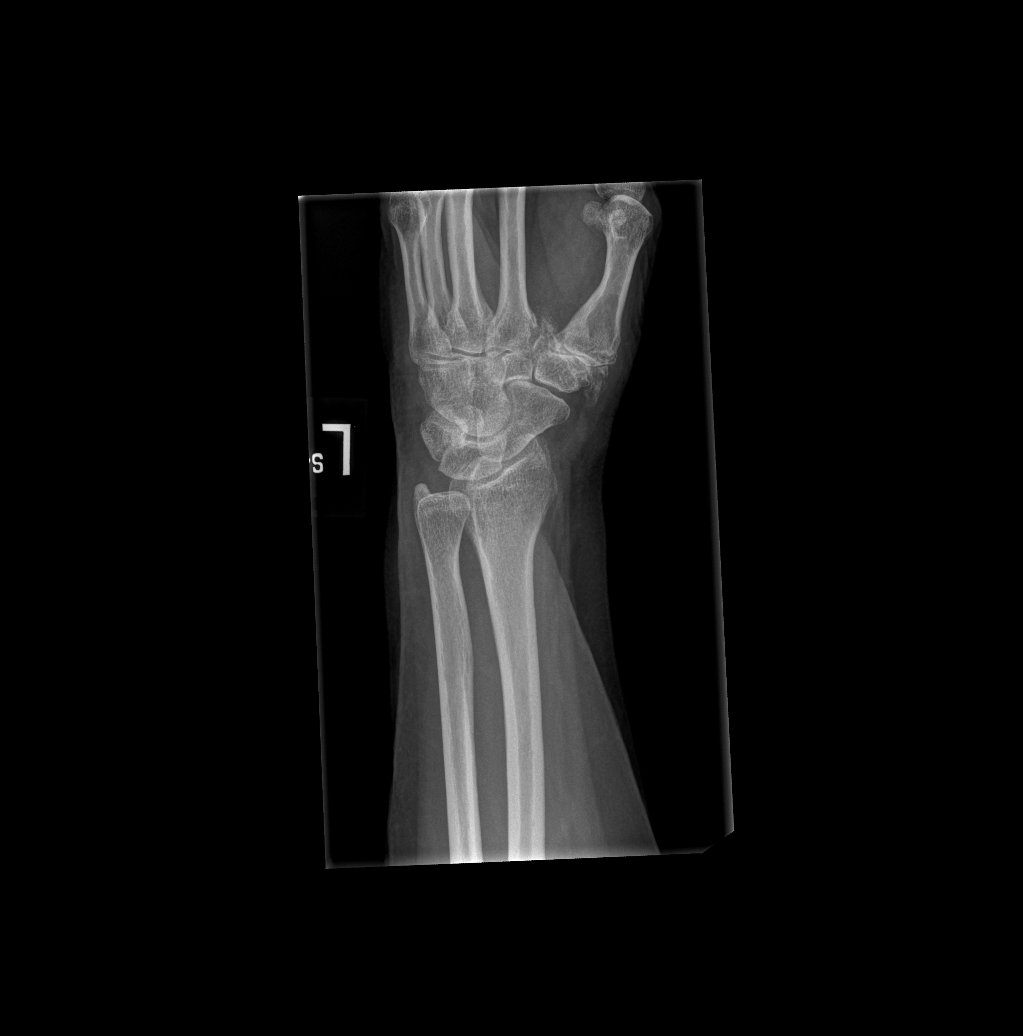

[x wrist lat left]
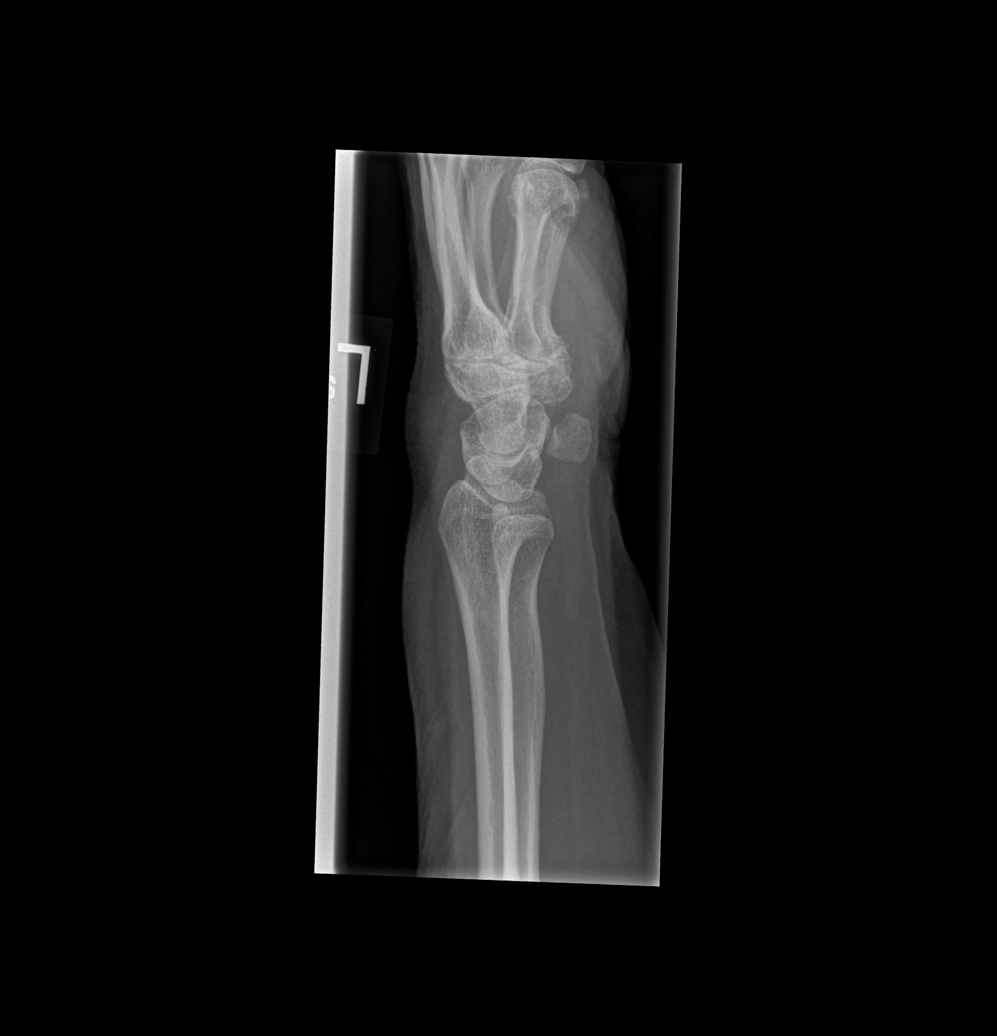

[x wrist navicular view left]
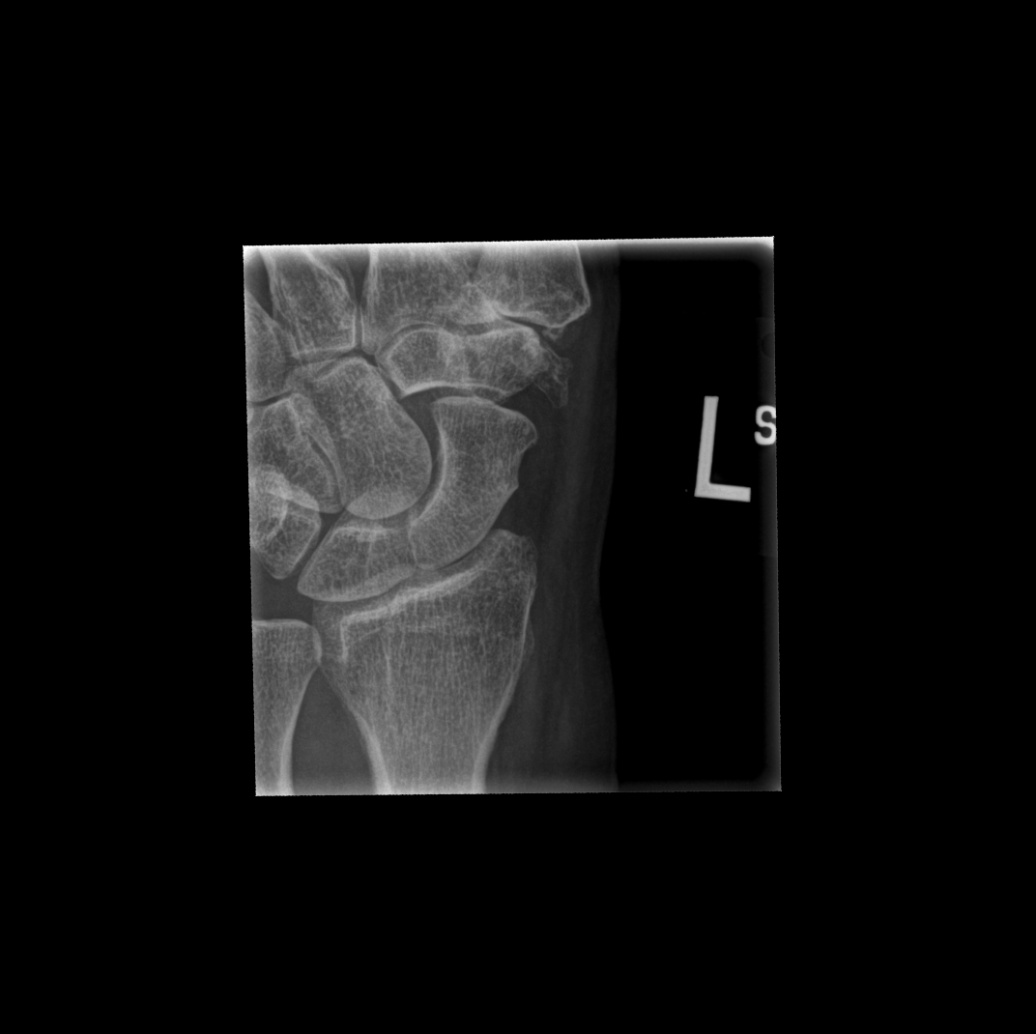

[4 of 4 positions shown; findings below may reference images not displayed]

FINDINGS: There is no evidence of fracture or dislocation. Carpal bones
intact. Severe osteoarthritis of the first [facility] joint. Mild triscaphe
joint space narrowing. Mild soft tissue swelling.
IMPRESSION: 1. No acute osseous abnormality of the left wrist.
2. Severe osteoarthritis of the first [facility] joint.

## 2021-03-14 IMAGING — CR DG STERNUM 2+V
2 series · 2 of 2 positions shown · non-contrast
Comparison: None.

CLINICAL DATA: Inferior sternal pain after fall

EXAM:
STERNUM - 2+ VIEW

[w chest pa]
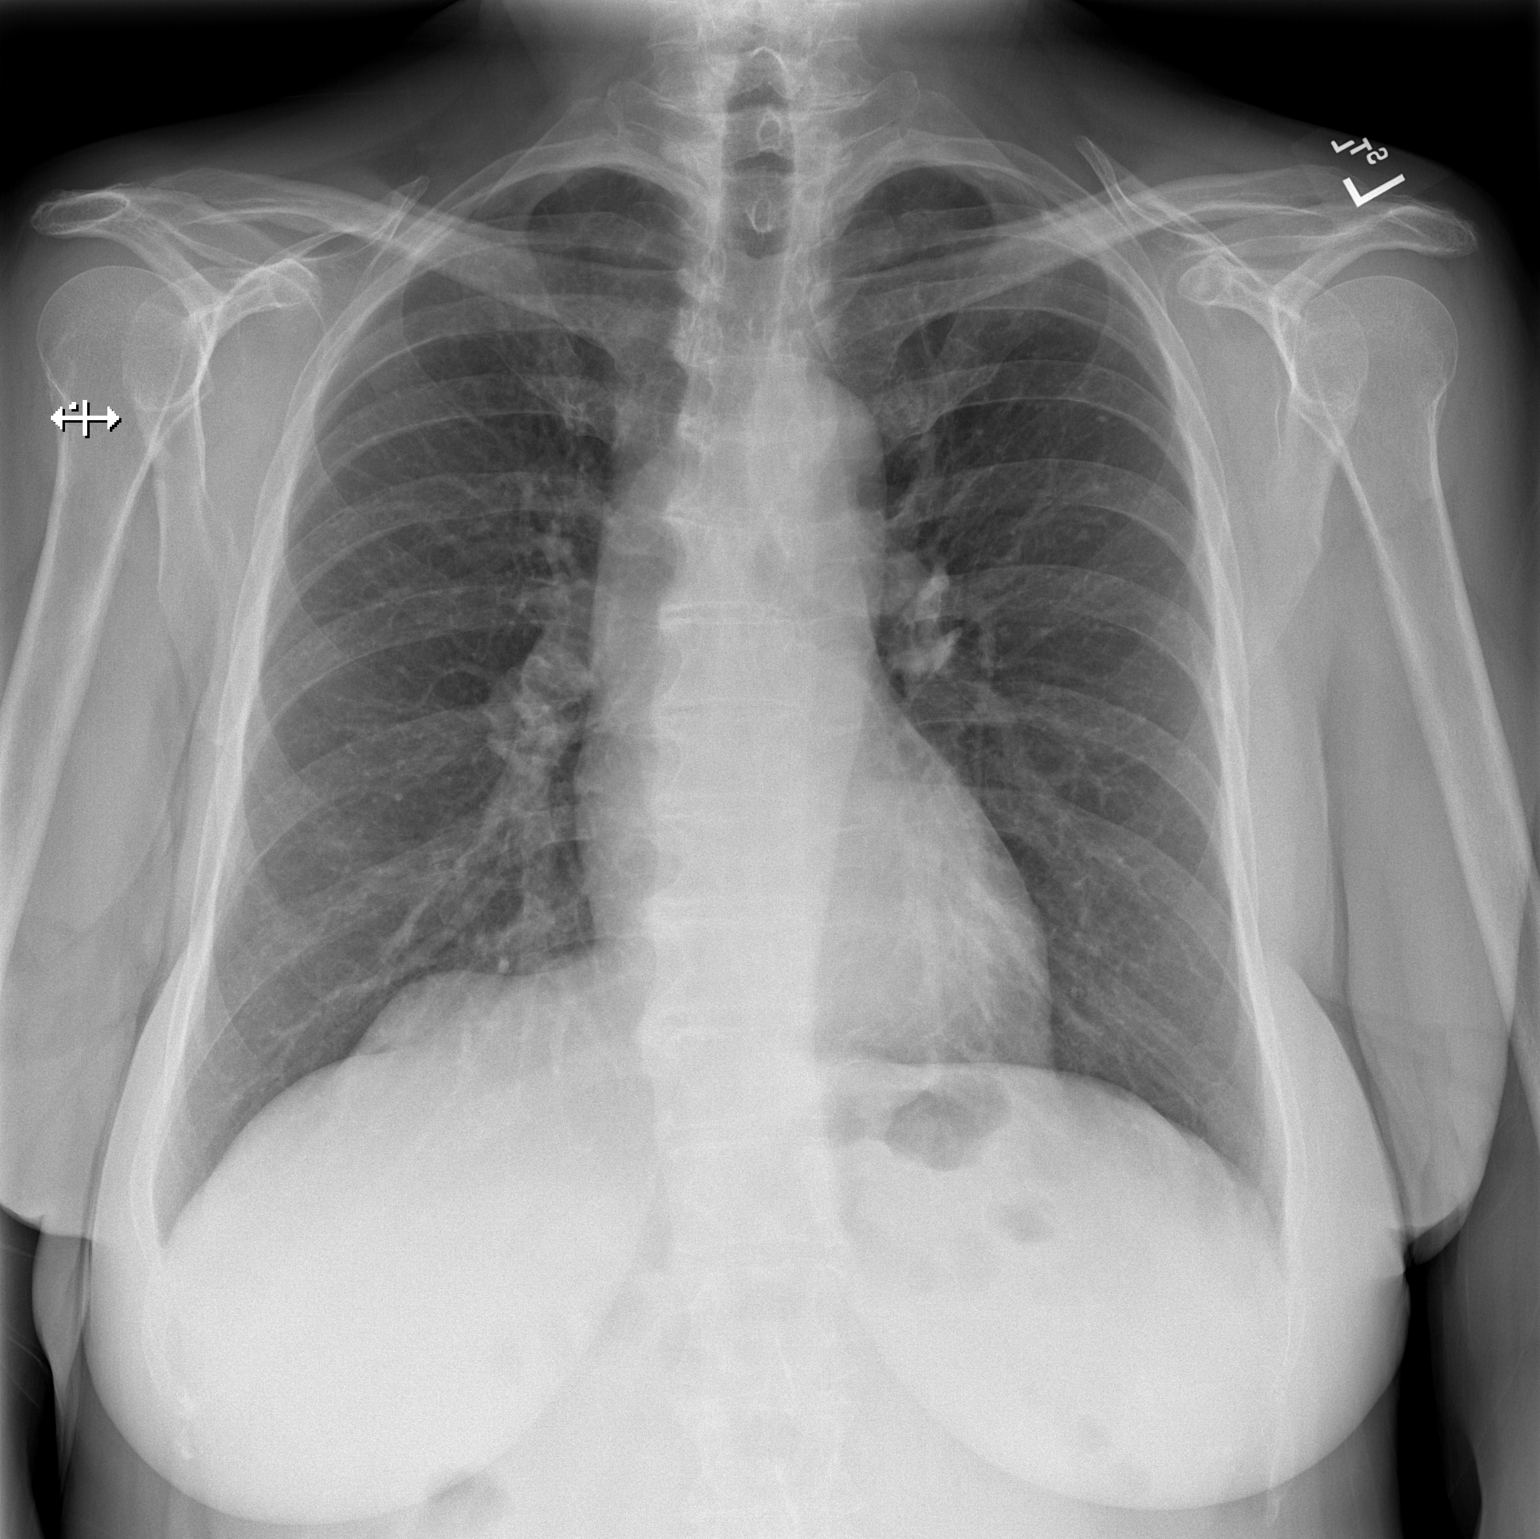

[w sternum lat]
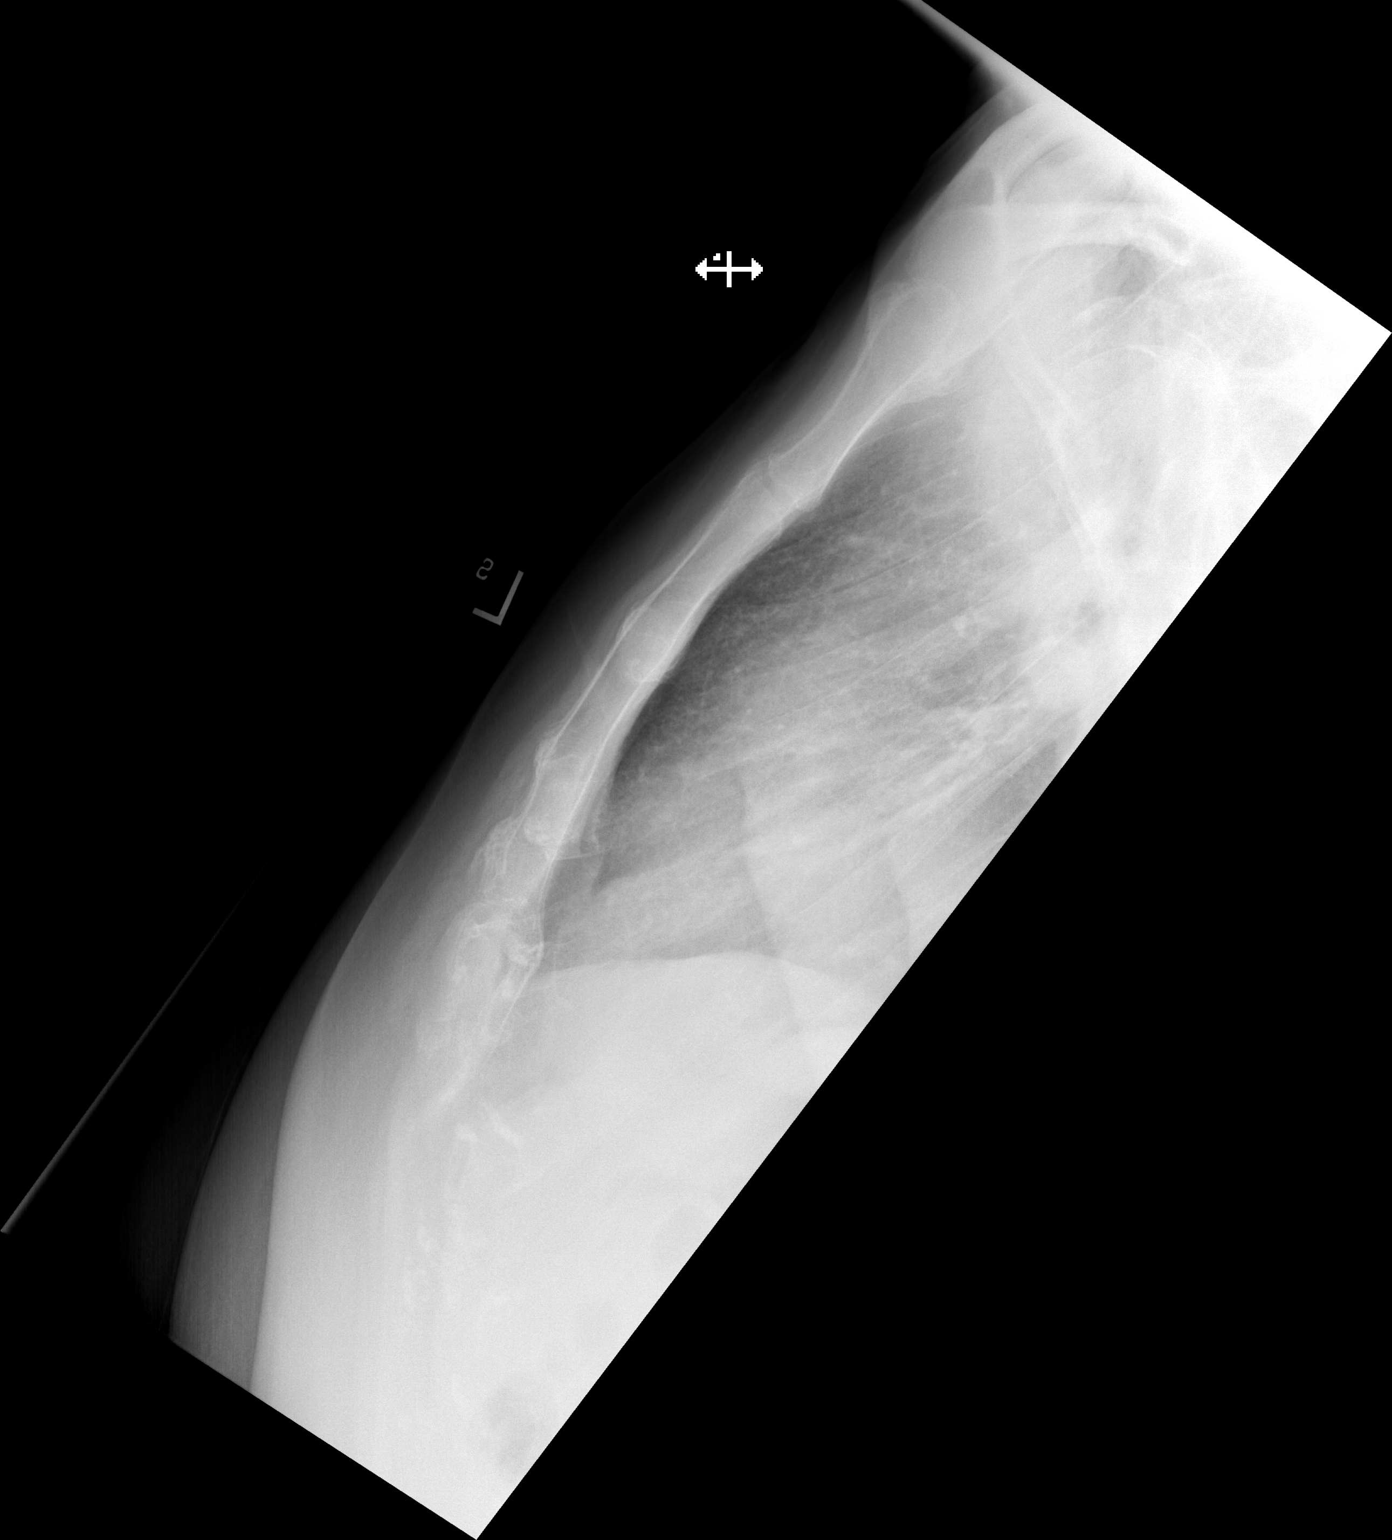

[2 of 2 positions shown; findings below may reference images not displayed]

FINDINGS: There is no evidence of fracture of the sternum or manubrium. Heart
size is normal. Lungs are clear.
IMPRESSION: Negative.

## 2021-03-19 ENCOUNTER — Ambulatory Visit (INDEPENDENT_AMBULATORY_CARE_PROVIDER_SITE_OTHER): Payer: Medicare Other | Admitting: Family Medicine

## 2021-03-19 ENCOUNTER — Other Ambulatory Visit: Payer: Self-pay

## 2021-03-19 ENCOUNTER — Encounter: Payer: Self-pay | Admitting: Family Medicine

## 2021-03-19 VITALS — BP 110/70 | HR 66 | Temp 98.0°F | Ht 70.0 in | Wt 197.0 lb

## 2021-03-19 DIAGNOSIS — E039 Hypothyroidism, unspecified: Secondary | ICD-10-CM | POA: Diagnosis not present

## 2021-03-19 LAB — TSH: TSH: 1.19 u[IU]/mL (ref 0.35–4.50)

## 2021-03-19 MED ORDER — LEVOTHYROXINE SODIUM 112 MCG PO TABS: 112.0000 ug | ORAL_TABLET | Freq: Every day | ORAL | 3 refills | Status: DC

## 2021-03-19 NOTE — Progress Notes (Signed)
Subjective  CC:  Chief Complaint  Patient presents with   Hypothyroidism    HPI: Tina Dalton is a 70 y.o. female who presents to the office today to address the problems listed above in the chief complaint. Here for recheck of hypothyroidism.  Has been on hypothyroidism daily for decades.  She takes daily.  No symptoms of high or low thyroid.  Feels great. Overdue for health maintenance visit.  He has annual wellness visit scheduled.  Health maintenance items do include immunizations and screening testing.  Assessment  1. Acquired hypothyroidism      Plan  Hypothyroidism: Recheck TSH and adjust medications if indicated.  Clinically euthyroid  Follow up: Counseling done for scheduling of complete physical with lab work. 04/03/2021  Orders Placed This Encounter  Procedures   TSH    No orders of the defined types were placed in this encounter.     I reviewed the patients updated PMH, FH, and SocHx.    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 Schedule an appointment for further refills.    Allergies: Patient has No Known Allergies. Family History: Patient family history includes Alzheimer's disease in her mother; Cancer in her father; Coronary artery disease in her father; Hypertension in her father. Social History:  Patient  reports that she has never smoked. She has never used smokeless tobacco. She reports current alcohol use. She  reports that she does not use drugs.  Review of Systems: Constitutional: Negative for fever malaise or anorexia Cardiovascular: negative for chest pain Respiratory: negative for SOB or persistent cough Gastrointestinal: negative for abdominal pain  Objective  Vitals: BP 110/70   Pulse 66   Temp 98 F (36.7 C) (Temporal)   Ht 5\' 10"  (1.778 m)   Wt 197 lb (89.4 kg)   LMP  (LMP Unknown)   SpO2 97%   BMI 28.27 kg/m  General: no acute distress , A&Ox3 HEENT: PEERL, conjunctiva normal, neck is supple Cardiovascular:  RRR without murmur or gallop.  Respiratory:  Good breath sounds bilaterally, CTAB with normal respiratory effort Skin:  Warm, no rashes Neuro: No tremor  Lab Results  Component Value Date   TSH 2.13 07/12/2020     Commons side effects, risks, benefits, and alternatives for medications and treatment plan prescribed today were discussed, and the patient expressed understanding of the given instructions. Patient is instructed to call or message via MyChart if he/she has any questions or concerns regarding our treatment plan. No barriers to understanding were identified. We discussed Red Flag symptoms and signs in detail. Patient expressed understanding regarding what to do in case of urgent or emergency type symptoms.  Medication list was reconciled, printed and provided to the patient in AVS. Patient instructions and summary information was reviewed with the patient as documented in the AVS. This note was prepared with assistance of Dragon voice recognition software. Occasional wrong-word or sound-a-like substitutions may have occurred due  to the inherent limitations of voice recognition software  This visit occurred during the SARS-CoV-2 public health emergency.  Safety protocols were in place, including screening questions prior to the visit, additional usage of staff PPE, and extensive cleaning of exam room while observing appropriate contact time as indicated for  disinfecting solutions.

## 2021-03-19 NOTE — Patient Instructions (Addendum)
Please schedule an appointment for a complete physical and labwork. We will update your screening tests and immunizations that are due at that time.   I will release your lab results to you on your MyChart account with further instructions. Please reply with any questions.

## 2021-03-19 NOTE — Addendum Note (Signed)
Addended by: Asencion Partridge on: 03/19/2021 02:57 PM   Modules accepted: Orders

## 2021-03-31 ENCOUNTER — Encounter: Payer: Self-pay | Admitting: Family Medicine

## 2021-03-31 ENCOUNTER — Other Ambulatory Visit: Payer: Self-pay

## 2021-03-31 ENCOUNTER — Ambulatory Visit (INDEPENDENT_AMBULATORY_CARE_PROVIDER_SITE_OTHER): Payer: Medicare Other | Admitting: Family Medicine

## 2021-03-31 VITALS — BP 138/86 | HR 74 | Temp 97.3°F | Resp 16 | Ht 70.0 in | Wt 196.6 lb

## 2021-03-31 DIAGNOSIS — Z0000 Encounter for general adult medical examination without abnormal findings: Secondary | ICD-10-CM | POA: Diagnosis not present

## 2021-03-31 DIAGNOSIS — R030 Elevated blood-pressure reading, without diagnosis of hypertension: Secondary | ICD-10-CM

## 2021-03-31 DIAGNOSIS — E039 Hypothyroidism, unspecified: Secondary | ICD-10-CM | POA: Diagnosis not present

## 2021-03-31 DIAGNOSIS — Z1212 Encounter for screening for malignant neoplasm of rectum: Secondary | ICD-10-CM

## 2021-03-31 DIAGNOSIS — Z1211 Encounter for screening for malignant neoplasm of colon: Secondary | ICD-10-CM | POA: Diagnosis not present

## 2021-03-31 LAB — COMPREHENSIVE METABOLIC PANEL
ALT: 14 U/L (ref 0–35)
AST: 21 U/L (ref 0–37)
Albumin: 4.2 g/dL (ref 3.5–5.2)
Alkaline Phosphatase: 93 U/L (ref 39–117)
BUN: 11 mg/dL (ref 6–23)
CO2: 29 mEq/L (ref 19–32)
Calcium: 9.7 mg/dL (ref 8.4–10.5)
Chloride: 104 mEq/L (ref 96–112)
Creatinine, Ser: 0.77 mg/dL (ref 0.40–1.20)
GFR: 79.59 mL/min (ref >60.00)
Glucose, Bld: 89 mg/dL (ref 70–99)
Potassium: 4.1 mEq/L (ref 3.5–5.1)
Sodium: 141 mEq/L (ref 135–145)
Total Bilirubin: 0.4 mg/dL (ref 0.2–1.2)
Total Protein: 7.2 g/dL (ref 6.0–8.3)

## 2021-03-31 LAB — LIPID PANEL
Cholesterol: 242 mg/dL — ABNORMAL HIGH (ref 0–200)
HDL: 60.9 mg/dL (ref >39.00)
LDL Cholesterol: 151 mg/dL — ABNORMAL HIGH (ref 0–99)
NonHDL: 180.83
Total CHOL/HDL Ratio: 4
Triglycerides: 151 mg/dL — ABNORMAL HIGH (ref 0.0–149.0)
VLDL: 30.2 mg/dL (ref 0.0–40.0)

## 2021-03-31 LAB — CBC WITH DIFFERENTIAL/PLATELET
Basophils Absolute: 0 10*3/uL (ref 0.0–0.1)
Basophils Relative: 0.6 % (ref 0.0–3.0)
Eosinophils Absolute: 0.2 10*3/uL (ref 0.0–0.7)
Eosinophils Relative: 2.4 % (ref 0.0–5.0)
HCT: 40.2 % (ref 36.0–46.0)
Hemoglobin: 13.5 g/dL (ref 12.0–15.0)
Lymphocytes Relative: 24.4 % (ref 12.0–46.0)
Lymphs Abs: 1.6 10*3/uL (ref 0.7–4.0)
MCHC: 33.6 g/dL (ref 30.0–36.0)
MCV: 85.6 fl (ref 78.0–100.0)
Monocytes Absolute: 0.5 10*3/uL (ref 0.1–1.0)
Monocytes Relative: 7.2 % (ref 3.0–12.0)
Neutro Abs: 4.3 10*3/uL (ref 1.4–7.7)
Neutrophils Relative %: 65.4 % (ref 43.0–77.0)
Platelets: 205 10*3/uL (ref 150.0–400.0)
RBC: 4.7 Mil/uL (ref 3.87–5.11)
RDW: 13.4 % (ref 11.5–15.5)
WBC: 6.6 10*3/uL (ref 4.0–10.5)

## 2021-03-31 MED ORDER — SHINGRIX 50 MCG/0.5ML IM SUSR: 0.5000 mL | Freq: Once | INTRAMUSCULAR | 0 refills | Status: AC

## 2021-03-31 NOTE — Progress Notes (Signed)
Subjective  Chief Complaint  Patient presents with   Annual Exam    Non-fasting    Hypothyroidism   Health Maintenance    Patient due for 2nd pneumo vaccine, 20 or 23?     HPI: Tina Dalton is a 70 y.o. female who presents to [facility] [place] today for a Female Wellness Visit. She also has the concerns and/or needs as listed above in the chief complaint. These will be addressed in addition to the Health Maintenance Visit.   Wellness Visit: annual visit with health maintenance review and exam without Pap  Health maintenance: Colorectal cancer screening due.  Had normal Cologuard 3 years ago.  Prefers this screening again.  She is average risk.  No family history of colon cancer.  No history of colon polyps. Eligible for Shingrix and Pneumovax. Mammogram is scheduled for July. Overall, continues to feel well and keeps active. Chronic disease f/u and/or acute problem visit: (deemed necessary to be done in addition to the wellness visit): Hypothyroidism is well controlled by recent lab values. Pre-hypertension: Does not check blood pressures at home.  Mildly elevated today in the office. BP Readings from Last 3 Encounters:  03/31/21 138/86  03/19/21 110/70  12/13/20 122/68     Assessment  1. Annual physical exam   2. Screening for colorectal cancer   3. Prehypertension   4. History of bilateral knee replacement      Plan  Female Wellness Visit: Age appropriate Health Maintenance and Prevention measures were discussed with patient. Included topics are cancer screening recommendations, ways to keep healthy (see AVS) including dietary and exercise recommendations, regular eye and dental care, use of seat belts, and avoidance of moderate alcohol use and tobacco use.  Cologuard ordered and education given. BMI: discussed patient's BMI and encouraged positive lifestyle modifications to help get to or maintain a target BMI. HM needs and immunizations were  addressed and ordered. See below for orders. See HM and immunization section for updates.  Prescription for Shingrix given to patient.  She will get at [facility].  Discussed need for Pneumovax.  She will return for nurse visit to get this vaccination Routine labs and screening tests ordered including cmp, cbc and lipids where appropriate. Discussed recommendations regarding Vit D and calcium supplementation (see AVS)  Chronic disease management visit and/or acute problem visit: Hypothyroidism is well controlled. Pre-hypertension: Recommend home monitoring.  She has had variable readings here in the office.  Most recently was very normal.  Discussed weight loss and low-salt diet.  Follow up: Return in about 1 year (around 03/31/2022) for complete physical, hypothyroidism follow up.  Orders Placed This Encounter  Procedures   CBC with Differential/Platelet   Comprehensive metabolic panel   Lipid panel   Cologuard   Meds ordered this encounter  Medications   Zoster Vaccine Adjuvanted [facility]) injection    Sig: Inject 0.5 mLs into the muscle once for 1 dose. Please give 2nd dose 2-6 months after first dose    Dispense:  2 each    Refill:  0      Body mass index is 28.21 kg/m. 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 01/02/2020    Priority: Low   History of bilateral knee replacement 01/02/2020    Priority: Low   Vitamin D deficiency 05/29/2019    Priority: Low   Osteopenia after menopause 04/01/2020    DEXA July 2020: Lowest T score equals -1.9     Heart murmur, systolic 01/02/2020   Health Maintenance  Topic Date Due   Zoster Vaccines- Shingrix (1 of 2) Never done    Fecal DNA (Cologuard)  10/27/2020   PNA vac Low Risk Adult (2 of 2 - PPSV23) 01/01/2021   MAMMOGRAM  03/15/2021   INFLUENZA VACCINE  04/21/2021   DEXA SCAN  03/15/2022   TETANUS/TDAP  12/02/2030   COVID-19 Vaccine  Completed   Hepatitis C Screening  Completed   HPV VACCINES  Aged Out   Immunization History  Administered Date(s) Administered   Fluad Quad(high Dose 65+) 05/24/2019   Influenza, High Dose Seasonal PF 06/17/2018   Influenza,inj,Quad PF,6+ Mos 10/06/2017   PFIZER(Purple Top)SARS-COV-2 Vaccination 10/11/2019, 11/01/2019, 06/22/2020, 12/27/2020   Pneumococcal Conjugate-13 01/02/2020   Tdap 11/17/2017, 12/01/2020   We updated and reviewed the patient's past history in detail and it is documented below. Allergies: Patient has No Known Allergies. Past Medical History Patient  has a past medical history of Heart murmur, systolic (01/02/2020), History of bilateral knee replacement (01/02/2020), Hypothyroidism, Osteoarthritis of knee, and Osteopenia after menopause (04/01/2020). Past Surgical History Patient  has a past surgical history that includes S/P Knee replacement  (03/2015). Family History: Patient family history includes Alzheimer's disease in her mother; Cancer in her father; Coronary artery disease in her father; Hypertension in her father. Social History:  Patient  reports that she has never smoked. She has never used smokeless tobacco. She reports current alcohol use. She reports that she does not use drugs.  Review of Systems: Constitutional: negative for fever or malaise Ophthalmic: negative for photophobia, double vision or loss of vision Cardiovascular: negative for chest pain, dyspnea on exertion, or new LE swelling Respiratory: negative for SOB or persistent cough Gastrointestinal: negative for abdominal pain, change in bowel habits or melena Genitourinary: negative for dysuria or gross hematuria, no abnormal uterine bleeding or disharge Musculoskeletal:  negative for new gait disturbance or muscular weakness Integumentary: negative for new or persistent rashes, no breast lumps Neurological: negative for TIA or stroke symptoms Psychiatric: negative for SI or delusions Allergic/Immunologic: negative for hives  Patient Care Team    Relationship Specialty Notifications Start End  Willow Ora, MD PCP - General Family Medicine  01/02/20     Objective  Vitals: BP 138/86   Pulse 74   Temp (!) 97.3 F (36.3 C) (Temporal)   Resp 16   Ht 5\' 10"  (1.778 m)   Wt 196 lb 9.6 oz (89.2 kg)   LMP  (LMP Unknown)   SpO2 96%   BMI 28.21 kg/m  General:  Well developed, well nourished, no acute distress  Psych:  Alert and orientedx3,normal mood and affect HEENT:  Normocephalic, atraumatic, non-icteric sclera,  supple neck without adenopathy, mass or thyromegaly Cardiovascular:  Normal S1, S2, RRR without gallop, rub or murmur Respiratory:  Good breath sounds bilaterally, CTAB with normal respiratory effort Gastrointestinal: normal bowel sounds, soft, non-tender, no noted masses. No HSM MSK: no deformities, contusions. Joints are without erythema or swelling.  Skin:  Warm, no rashes or suspicious lesions noted, very dry skin Neurologic:    Mental status is normal. CN 2-11 are normal. Gross motor and  sensory exams are normal. Normal gait. No tremor Breast Exam: No mass, skin retraction or nipple discharge is appreciated in either breast. No axillary adenopathy. Fibrocystic changes are not noted   Commons side effects, risks, benefits, and alternatives for medications and treatment plan prescribed today were discussed, and the patient expressed understanding of the given instructions. Patient is instructed to call or message via MyChart if he/she has any questions or concerns regarding our treatment plan. No barriers to understanding were identified. We discussed Red Flag symptoms and signs in detail. Patient expressed understanding regarding what to do  in case of urgent or emergency type symptoms.  Medication list was reconciled, printed and provided to the patient in AVS. Patient instructions and summary information was reviewed with the patient as documented in the AVS. This note was prepared with assistance of Dragon voice recognition software. Occasional wrong-word or sound-a-like substitutions may have occurred due to the inherent limitations of voice recognition software  This visit occurred during the SARS-CoV-2 public health emergency.  Safety protocols were in place, including screening questions prior to the visit, additional usage of staff PPE, and extensive cleaning of exam room while observing appropriate contact time as indicated for disinfecting solutions.

## 2021-03-31 NOTE — Patient Instructions (Signed)
Please return in 12 months for your annual complete physical; please come fasting. Please schedule a nurse visit to receive your pnuemovax 23 vaccination.   I will release your lab results to you on your MyChart account with further instructions. Please reply with any questions.    Please take the prescription for Shingrix to the pharmacy so they may administer the vaccinations. Your insurance will then cover the injections.   I recommend the Cologuard test for your colon cancer screening that is due. I have ordered this test for you. The [facility] will soon contact you to verify your insurance, address etc. They will then send you the kit; follow the instructions in the kit and return the kit to Cologuard. They will run the test and send the results to me. I will then give you the results. If this test is negative, we recommend repeating a colon cancer screening test in 3 years. If it is positive, I will refer you to a Gastroenterologist so you can get set up for the recommended colonoscopy.  Thank you!  Calcium Intake Recommendations You can take Caltrate Plus twice a day or get it through your diet or other OTC supplements (Viactiv, OsCal etc)  Calcium is a mineral that affects many functions in the body, including: Blood clotting. Blood vessel function. Nerve impulse conduction. Hormone secretion. Muscle contraction. Bone and teeth functions.  Most of your body's calcium supply is stored in your bones and teeth. When your calcium stores are low, you may be at risk for low bone mass, bone loss, and bone fractures. Consuming enough calcium helps to grow healthy bones and teeth and to prevent breakdown over time. It is very important that you get enough calcium if you are: A child undergoing rapid growth. An adolescent girl. A pre- or post-menopausal woman. A woman whose menstrual cycle has stopped due to anorexia nervosa or regular intense exercise. An individual with lactose  intolerance or a milk allergy. A vegetarian.  What is my plan? Try to consume the recommended amount of calcium daily based on your age. Depending on your overall health, your health care provider may recommend increased calcium intake. General daily calcium intake recommendations by age are: Birth to 6 months: 200 mg. Infants 7 to 12 months: 260 mg. Children 1 to 3 years: 700 mg. Children 4 to 8 years: 1,000 mg. Children 9 to 13 years: 1,300 mg. Teens 14 to 18 years: 1,300 mg. Adults 19 to 50 years: 1,000 mg. Adult women 51 to 70 years: 1,200 mg. Adult men 51 to 70 years: 1,000 mg. Adults 71 years and older: 1,200 mg. Pregnant and breastfeeding teens: 1,300 mg. Pregnant and breastfeeding adults: 1,000 mg.  What do I need to know about calcium intake? In order for the body to absorb calcium, it needs vitamin D. You can get vitamin D through (we recommend getting [id number] units of Vitamin D daily) Direct exposure of the skin to sunlight. Foods, such as egg yolks, liver, saltwater fish, and fortified milk. Supplements. Consuming too much calcium may cause: Constipation. Decreased absorption of iron and zinc. Kidney stones. Calcium supplements may interact with certain medicines. Check with your health care provider before starting any calcium supplements. Try to get most of your calcium from food. What foods can I eat? Grains  Fortified oatmeal. Fortified ready-to-eat cereals. Fortified frozen waffles. Vegetables Turnip greens. Broccoli. Fruits Fortified orange juice. Meats and Other Protein Sources Canned sardines with bones. Canned salmon with bones. Soy beans. Tofu. Baked  beans. Almonds. [place] nuts. Sunflower seeds. Dairy Milk. Yogurt. Cheese. Cottage cheese. Beverages Fortified soy milk. Fortified rice milk. Sweets/Desserts Pudding. Ice Cream. Milkshakes. Blackstrap molasses. The items listed above may not be a complete list of recommended foods or beverages. Contact  your dietitian for more options. What foods can affect my calcium intake? It may be more difficult for your body to use calcium or calcium may leave your body more quickly if you consume large amounts of: Sodium. Protein. Caffeine. Alcohol.  This information is not intended to replace advice given to you by your health care provider. Make sure you discuss any questions you have with your health care provider. Document Released: 04/21/2004 Document Revised: 03/27/2016 Document Reviewed: 02/13/2014 Elsevier Interactive Patient Education  [employer].   If you have any questions or concerns, please don't hesitate to send me a message via MyChart or call the office at [phone]. Thank you for visiting with [place] today! It's our pleasure caring for you.

## 2021-04-01 ENCOUNTER — Encounter: Payer: Self-pay | Admitting: Family Medicine

## 2021-04-01 DIAGNOSIS — E782 Mixed hyperlipidemia: Secondary | ICD-10-CM

## 2021-04-01 HISTORY — DX: Mixed hyperlipidemia: E78.2

## 2021-04-03 ENCOUNTER — Ambulatory Visit (INDEPENDENT_AMBULATORY_CARE_PROVIDER_SITE_OTHER): Payer: Medicare Other

## 2021-04-03 DIAGNOSIS — Z0000 Encounter for general adult medical examination without abnormal findings: Secondary | ICD-10-CM

## 2021-04-03 NOTE — Progress Notes (Signed)
Virtual Visit via Telephone Note  I connected with  Tina Dalton on 04/03/21 at  1:45 PM EDT by telephone and verified that I am speaking with the correct person using two identifiers.  Location: Patient: Home Provider: Office Persons participating in the virtual visit: patient/Nurse Health Advisor   I discussed the limitations, risks, security and privacy concerns of performing an evaluation and management service by telephone and the availability of in person appointments. The patient expressed understanding and agreed to proceed.  Interactive audio and video telecommunications were attempted between this nurse and patient, however failed, due to patient having technical difficulties OR patient did not have access to video capability.  We continued and completed visit with audio only.  Some vital signs may be absent or patient reported.   Tina Schleinina H Ledon Weihe, LPN   Subjective:   Tina Dalton is a 70 y.o. female who presents for Medicare Annual (Subsequent) preventive examination.  Review of Systems     Cardiac Risk Factors include: advanced age (>[id number], 36>65 women);dyslipidemia     Objective:    There were no vitals filed for this visit. There is no height or weight on file to calculate BMI.  Advanced Directives 04/03/2021 12/01/2020 04/01/2020  Does Patient Have a Medical Advance Directive? Yes No Yes  Type of Youth workerAdvance Directive Healthcare Power of Attorney - Healthcare Power of OctaAttorney;Living will  Copy of Healthcare Power of Attorney in Chart? Yes - validated most recent copy scanned in chart (See row information) - No - copy requested  Would patient like information on creating a medical advance directive? - No - Patient declined -    Current Medications (verified) Outpatient Encounter Medications as of 04/03/2021  Medication Sig   levothyroxine (SYNTHROID) 112 MCG tablet Take 1 tablet (112 mcg total) by mouth daily before breakfast.   PFIZER-BIONT COVID-19 VAC-TRIS SUSP  injection    No facility-administered encounter medications on file as of 04/03/2021.    Allergies (verified) Patient has no known allergies.   History: Past Medical History:  Diagnosis Date   Heart murmur, systolic 01/02/2020   History of bilateral knee replacement 01/02/2020   Hypothyroidism    Mixed hyperlipidemia 04/01/2021   ASCVD 8.3% 03/2021; offered statin   Osteoarthritis of knee    Osteopenia after menopause 04/01/2020   DEXA July 2020: Lowest T score equals -1.9   Past Surgical History:  Procedure Laterality Date   S/P Knee replacement   03/2015   Family History  Problem Relation Age of Onset   Hypertension Father    Cancer Father        Skin   Coronary artery disease Father    Alzheimer's disease Mother    Social History   Socioeconomic History   Marital status: Single    Spouse name: Not on file   Number of children: Not on file   Years of education: Not on file   Highest education level: Not on file  Occupational History   Not on file  Tobacco Use   Smoking status: Never   Smokeless tobacco: Never  Vaping Use   Vaping Use: Never used  Substance and Sexual Activity   Alcohol use: Not Currently   Drug use: Never   Sexual activity: Yes  Other Topics Concern   Not on file  Social History Narrative   Not on file   Social Determinants of Health   Financial Resource Strain: Low Risk    Difficulty of Paying Living Expenses: Not hard at all  Food  Insecurity: No Food Insecurity   Worried About Programme researcher, broadcasting/film/video in the Last Year: Never true   Ran Out of Food in the Last Year: Never true  Transportation Needs: No Transportation Needs   Lack of Transportation (Medical): No   Lack of Transportation (Non-Medical): No  Physical Activity: Sufficiently Active   Days of Exercise per Week: 7 days   Minutes of Exercise per Session: 90 min  Stress: No Stress Concern Present   Feeling of Stress : Not at all  Social Connections: Socially Isolated   Frequency  of Communication with Friends and Family: Three times a week   Frequency of Social Gatherings with Friends and Family: Twice a week   Attends Religious Services: Never   Database administrator or Organizations: No   Attends Engineer, structural: Never   Marital Status: Divorced    Tobacco Counseling Counseling given: Not Answered   Clinical Intake:  Pre-visit preparation completed: Yes  Pain : No/denies pain     BMI - recorded: 28.21 Nutritional Status: BMI 25 -29 Overweight Nutritional Risks: None Diabetes: No  How often do you need to have someone help you when you read instructions, pamphlets, or other written materials from your doctor or pharmacy?: 1 - Never  Diabetic?No  Interpreter Needed?: No  Information entered by :: Lorine Bears   Activities of Daily Living In your present state of health, do you have any difficulty performing the following activities: 04/03/2021 03/31/2021  Hearing? N N  Vision? N N  Difficulty concentrating or making decisions? Y N  Comment memory at times -  Walking or climbing stairs? N N  Dressing or bathing? N N  Doing errands, shopping? N N  Preparing Food and eating ? N -  Using the Toilet? N -  In the past six months, have you accidently leaked urine? N -  Do you have problems with loss of bowel control? N -  Managing your Medications? N -  Managing your Finances? N -  Housekeeping or managing your Housekeeping? N -  Some recent data might be hidden    Patient Care Team: Willow Ora, MD as PCP - General (Family Medicine)  Indicate any recent Medical Services you may have received from other than [facility] providers in the past year (date may be approximate).     Assessment:   This is a routine wellness examination for Tina Dalton.  Hearing/Vision screen Hearing Screening - Comments:: Pt denies any hearing issues  Vision Screening - Comments:: Pt follows up annually with [facility] Dr Genia Del for exams    Dietary issues and exercise activities discussed: Current Exercise Habits: Home exercise routine, Type of exercise: treadmill, Time (Minutes): > 60, Frequency (Times/Week): 7, Weekly Exercise (Minutes/Week): 0   Goals Addressed             This Visit's Progress    Patient Stated       Lose 20 lbs        Depression Screen PHQ 2/9 Scores 04/03/2021 03/31/2021 03/19/2021 04/01/2020 05/24/2019 10/06/2017  PHQ - 2 Score 0 0 0 0 0 0  PHQ- 9 Score - - - - 0 -    Fall Risk Fall Risk  04/03/2021 03/31/2021 03/19/2021 04/01/2020 01/02/2020  Falls in the past year? 1 0 0 1 0  Number falls in past yr: 1 0 - 1 0  Injury with Fall? 1 0 - 1 0  Comment bruises from fall on a street - - hurt  right hand , didn't recieve any medical treatment -  Risk for fall due to : Impaired vision - No Fall Risks Impaired vision -  Follow up Falls prevention discussed - - Falls prevention discussed -    FALL RISK PREVENTION PERTAINING TO THE HOME:  Any stairs in or around the home? Yes  If so, are there any without handrails? No  Home free of loose throw rugs in walkways, pet beds, electrical cords, etc? Yes  Adequate lighting in your home to reduce risk of falls? Yes   ASSISTIVE DEVICES UTILIZED TO PREVENT FALLS:  Life alert? No  Use of a cane, walker or w/c? No  Grab bars in the bathroom? Yes  Shower chair or bench in shower? No  Elevated toilet seat or a handicapped toilet? No   TIMED UP AND GO:  Was the test performed? No     Cognitive Function:     6CIT Screen 04/03/2021 04/01/2020  What Year? 0 points 0 points  What month? 0 points 0 points  What time? 0 points -  Count back from 20 0 points 0 points  Months in reverse 0 points 0 points  Repeat phrase 0 points 0 points  Total Score 0 -    Immunizations Immunization History  Administered Date(s) Administered   Fluad Quad(high Dose 65+) 05/24/2019   Influenza, High Dose Seasonal PF 06/17/2018   Influenza,inj,Quad PF,6+ Mos 10/06/2017    PFIZER(Purple Top)SARS-COV-2 Vaccination 10/11/2019, 11/01/2019, 06/22/2020, 12/27/2020   Pneumococcal Conjugate-13 01/02/2020   Tdap 11/17/2017, 12/01/2020    TDAP status: Up to date  Flu Vaccine status: Due, Education has been provided regarding the importance of this vaccine. Advised may receive this vaccine at local pharmacy or Health Dept. Aware to provide a copy of the vaccination record if obtained from local pharmacy or Health Dept. Verbalized acceptance and understanding.  Pneumococcal vaccine status: Due, Education has been provided regarding the importance of this vaccine. Advised may receive this vaccine at local pharmacy or Health Dept. Aware to provide a copy of the vaccination record if obtained from local pharmacy or Health Dept. Verbalized acceptance and understanding.  Covid-19 vaccine status: Completed vaccines  Qualifies for Shingles Vaccine? Yes   Zostavax completed No   Shingrix Completed?: No.    Education has been provided regarding the importance of this vaccine. Patient has been advised to call insurance company to determine out of pocket expense if they have not yet received this vaccine. Advised may also receive vaccine at local pharmacy or Health Dept. Verbalized acceptance and understanding.  Screening Tests Health Maintenance  Topic Date Due   Zoster Vaccines- Shingrix (1 of 2) Never done   Fecal DNA (Cologuard)  10/27/2020   PNA vac Low Risk Adult (2 of 2 - PPSV23) 01/01/2021   MAMMOGRAM  03/15/2021   INFLUENZA VACCINE  04/21/2021   DEXA SCAN  03/15/2022   TETANUS/TDAP  12/02/2030   COVID-19 Vaccine  Completed   Hepatitis C Screening  Completed   HPV VACCINES  Aged Out    Health Maintenance  Health Maintenance Due  Topic Date Due   Zoster Vaccines- Shingrix (1 of 2) Never done   Fecal DNA (Cologuard)  10/27/2020   PNA vac Low Risk Adult (2 of 2 - PPSV23) 01/01/2021   MAMMOGRAM  03/15/2021    Colorectal cancer screening: Type of screening:  Cologuard. Completed 10/27/17. Repeat every 3 years  Mammogram status: Completed 03/15/20. Repeat every year  Bone Density status: Completed 03/15/20. Results reflect: Bone  density results: OSTEOPENIA. Repeat every 2 years.   Additional Screening:  Hepatitis C Screening: Completed 06/12/18  Vision Screening: Recommended annual ophthalmology exams for early detection of glaucoma and other disorders of the eye. Is the patient up to date with their annual eye exam?  Yes  Who is the provider or what is the name of the office in which the patient attends annual eye exams? Dr Genia Del If pt is not established with a provider, would they like to be referred to a provider to establish care? No .   Dental Screening: Recommended annual dental exams for proper oral hygiene  Community Resource Referral / Chronic Care Management: CRR required this visit?  No   CCM required this visit?  No      Plan:     I have personally reviewed and noted the following in the patient's chart:   Medical and social history Use of alcohol, tobacco or illicit drugs  Current medications and supplements including opioid prescriptions.  Functional ability and status Nutritional status Physical activity Advanced directives List of other physicians Hospitalizations, surgeries, and ER visits in previous 12 months Vitals Screenings to include cognitive, depression, and falls Referrals and appointments  In addition, I have reviewed and discussed with patient certain preventive protocols, quality metrics, and best practice recommendations. A written personalized care plan for preventive services as well as general preventive health recommendations were provided to patient.     Tina Schlein, LPN   0/[id number]/4270   Nurse Notes: Pt will follow up with cologuard and vaccinations after [place], per conversation with Dr Mardelle Matte

## 2021-04-03 NOTE — Patient Instructions (Signed)
Tina Dalton , Thank you for taking time to come for your Medicare Wellness Visit. I appreciate your ongoing commitment to your health goals. Please review the following plan we discussed and let me know if I can assist you in the future.   Screening recommendations/referrals: Colonoscopy: Pt stated she will follow up after Vacation Mammogram: Done 03/15/20 repeat every year Bone Density: Done 03/15/20 every 2 years  Recommended yearly ophthalmology/optometry visit for glaucoma screening and checkup Recommended yearly dental visit for hygiene and checkup  Vaccinations: Influenza vaccine: Due 04/21/21 Pneumococcal vaccine: Due and discussed Tdap vaccine: Done 12/01/20 repeat in 10 years 12/02/30 Shingles vaccine: Shingrix discussed. Please contact your pharmacy for coverage information.    Covid-19:Completed 1/20, 2/10, 06/22/20 & 12/27/20  Advanced directives: Copies in chart   Conditions/risks identified: Lose 20 lbs   Next appointment: Follow up in one year for your annual wellness visit    Preventive Care 65 Years and Older, Female Preventive care refers to lifestyle choices and visits with your health care provider that can promote health and wellness. What does preventive care include? A yearly physical exam. This is also called an annual well check. Dental exams once or twice a year. Routine eye exams. Ask your health care provider how often you should have your eyes checked. Personal lifestyle choices, including: Daily care of your teeth and gums. Regular physical activity. Eating a healthy diet. Avoiding tobacco and drug use. Limiting alcohol use. Practicing safe sex. Taking low-dose aspirin every day. Taking vitamin and mineral supplements as recommended by your health care provider. What happens during an annual well check? The services and screenings done by your health care provider during your annual well check will depend on your age, overall health, lifestyle risk factors,  and family history of disease. Counseling  Your health care provider may ask you questions about your: Alcohol use. Tobacco use. Drug use. Emotional well-being. Home and relationship well-being. Sexual activity. Eating habits. History of falls. Memory and ability to understand (cognition). Work and work Astronomer. Reproductive health. Screening  You may have the following tests or measurements: Height, weight, and BMI. Blood pressure. Lipid and cholesterol levels. These may be checked every 5 years, or more frequently if you are over 57 years old. Skin check. Lung cancer screening. You may have this screening every year starting at age 61 if you have a 30-pack-year history of smoking and currently smoke or have quit within the past 15 years. Fecal occult blood test (FOBT) of the stool. You may have this test every year starting at age 75. Flexible sigmoidoscopy or colonoscopy. You may have a sigmoidoscopy every 5 years or a colonoscopy every 10 years starting at age 9. Hepatitis C blood test. Hepatitis B blood test. Sexually transmitted disease (STD) testing. Diabetes screening. This is done by checking your blood sugar (glucose) after you have not eaten for a while (fasting). You may have this done every 1-3 years. Bone density scan. This is done to screen for osteoporosis. You may have this done starting at age 31. Mammogram. This may be done every 1-2 years. Talk to your health care provider about how often you should have regular mammograms. Talk with your health care provider about your test results, treatment options, and if necessary, the need for more tests. Vaccines  Your health care provider may recommend certain vaccines, such as: Influenza vaccine. This is recommended every year. Tetanus, diphtheria, and acellular pertussis (Tdap, Td) vaccine. You may need a Td booster every 10  years. Zoster vaccine. You may need this after age 3. Pneumococcal 13-valent conjugate  (PCV13) vaccine. One dose is recommended after age 76. Pneumococcal polysaccharide (PPSV23) vaccine. One dose is recommended after age 4. Talk to your health care provider about which screenings and vaccines you need and how often you need them. This information is not intended to replace advice given to you by your health care provider. Make sure you discuss any questions you have with your health care provider. Document Released: 10/04/2015 Document Revised: 05/27/2016 Document Reviewed: 07/09/2015 Elsevier Interactive Patient Education  2017 [place] Prevention in the Home Falls can cause injuries. They can happen to people of all ages. There are many things you can do to make your home safe and to help prevent falls. What can I do on the outside of my home? Regularly fix the edges of walkways and driveways and fix any cracks. Remove anything that might make you trip as you walk through a door, such as a raised step or threshold. Trim any bushes or trees on the path to your home. Use bright outdoor lighting. Clear any walking paths of anything that might make someone trip, such as rocks or tools. Regularly check to see if handrails are loose or broken. Make sure that both sides of any steps have handrails. Any raised decks and porches should have guardrails on the edges. Have any leaves, snow, or ice cleared regularly. Use sand or salt on walking paths during winter. Clean up any spills in your garage right away. This includes oil or grease spills. What can I do in the bathroom? Use night lights. Install grab bars by the toilet and in the tub and shower. Do not use towel bars as grab bars. Use non-skid mats or decals in the tub or shower. If you need to sit down in the shower, use a plastic, non-slip stool. Keep the floor dry. Clean up any water that spills on the floor as soon as it happens. Remove soap buildup in the tub or shower regularly. Attach bath mats securely with  double-sided non-slip rug tape. Do not have throw rugs and other things on the floor that can make you trip. What can I do in the bedroom? Use night lights. Make sure that you have a light by your bed that is easy to reach. Do not use any sheets or blankets that are too big for your bed. They should not hang down onto the floor. Have a firm chair that has side arms. You can use this for support while you get dressed. Do not have throw rugs and other things on the floor that can make you trip. What can I do in the kitchen? Clean up any spills right away. Avoid walking on wet floors. Keep items that you use a lot in easy-to-reach places. If you need to reach something above you, use a strong step stool that has a grab bar. Keep electrical cords out of the way. Do not use floor polish or wax that makes floors slippery. If you must use wax, use non-skid floor wax. Do not have throw rugs and other things on the floor that can make you trip. What can I do with my stairs? Do not leave any items on the stairs. Make sure that there are handrails on both sides of the stairs and use them. Fix handrails that are broken or loose. Make sure that handrails are as long as the stairways. Check any carpeting to make sure  that it is firmly attached to the stairs. Fix any carpet that is loose or worn. Avoid having throw rugs at the top or bottom of the stairs. If you do have throw rugs, attach them to the floor with carpet tape. Make sure that you have a light switch at the top of the stairs and the bottom of the stairs. If you do not have them, ask someone to add them for you. What else can I do to help prevent falls? Wear shoes that: Do not have high heels. Have rubber bottoms. Are comfortable and fit you well. Are closed at the toe. Do not wear sandals. If you use a stepladder: Make sure that it is fully opened. Do not climb a closed stepladder. Make sure that both sides of the stepladder are locked  into place. Ask someone to hold it for you, if possible. Clearly mark and make sure that you can see: Any grab bars or handrails. First and last steps. Where the edge of each step is. Use tools that help you move around (mobility aids) if they are needed. These include: Canes. Walkers. Scooters. Crutches. Turn on the lights when you go into a dark area. Replace any light bulbs as soon as they burn out. Set up your furniture so you have a clear path. Avoid moving your furniture around. If any of your floors are uneven, fix them. If there are any pets around you, be aware of where they are. Review your medicines with your doctor. Some medicines can make you feel dizzy. This can increase your chance of falling. Ask your doctor what other things that you can do to help prevent falls. This information is not intended to replace advice given to you by your health care provider. Make sure you discuss any questions you have with your health care provider. Document Released: 07/04/2009 Document Revised: 02/13/2016 Document Reviewed: 10/12/2014 Elsevier Interactive Patient Education  2017 [employer].

## 2021-04-09 ENCOUNTER — Other Ambulatory Visit: Payer: Self-pay | Admitting: *Deleted

## 2021-04-09 DIAGNOSIS — Z1211 Encounter for screening for malignant neoplasm of colon: Secondary | ICD-10-CM

## 2021-04-09 DIAGNOSIS — Z1212 Encounter for screening for malignant neoplasm of rectum: Secondary | ICD-10-CM

## 2021-04-09 DIAGNOSIS — Z0000 Encounter for general adult medical examination without abnormal findings: Secondary | ICD-10-CM

## 2021-04-16 ENCOUNTER — Ambulatory Visit
Admission: RE | Admit: 2021-04-16 | Discharge: 2021-04-16 | Disposition: A | Discharge status: Home / Self Care | Payer: Medicare Other | Source: Ambulatory Visit | Attending: Family Medicine | Admitting: Family Medicine

## 2021-04-16 ENCOUNTER — Other Ambulatory Visit: Payer: Self-pay

## 2021-04-16 DIAGNOSIS — Z1231 Encounter for screening mammogram for malignant neoplasm of breast: Secondary | ICD-10-CM | POA: Diagnosis not present

## 2021-04-21 DIAGNOSIS — Z1211 Encounter for screening for malignant neoplasm of colon: Secondary | ICD-10-CM | POA: Diagnosis not present

## 2021-04-21 DIAGNOSIS — Z1212 Encounter for screening for malignant neoplasm of rectum: Secondary | ICD-10-CM | POA: Diagnosis not present

## 2021-04-23 ENCOUNTER — Other Ambulatory Visit: Payer: Self-pay

## 2021-04-23 ENCOUNTER — Ambulatory Visit (INDEPENDENT_AMBULATORY_CARE_PROVIDER_SITE_OTHER): Payer: Medicare Other

## 2021-04-23 DIAGNOSIS — Z23 Encounter for immunization: Secondary | ICD-10-CM

## 2021-04-26 LAB — COLOGUARD: Cologuard: NEGATIVE

## 2021-07-07 DIAGNOSIS — Z01818 Encounter for other preprocedural examination: Secondary | ICD-10-CM | POA: Diagnosis not present

## 2021-07-07 DIAGNOSIS — H25812 Combined forms of age-related cataract, left eye: Secondary | ICD-10-CM | POA: Diagnosis not present

## 2021-07-07 DIAGNOSIS — H2511 Age-related nuclear cataract, right eye: Secondary | ICD-10-CM | POA: Diagnosis not present

## 2021-07-23 ENCOUNTER — Telehealth: Payer: Self-pay

## 2021-07-23 NOTE — Telephone Encounter (Signed)
Gave message below, patient states granddaughter has no rash nor does she but will keep an eye if anything changes.

## 2021-07-23 NOTE — Telephone Encounter (Signed)
Please advise 

## 2021-07-23 NOTE — Telephone Encounter (Signed)
Patient states Dr. Mardelle Matte treated her daughter for Scabies.    States the grand daughter stayed the night with her and now she is afraid she may have scabies.  However, she does not have any signs or symptoms.  Is requesting a call back to determine if she needs to go ahead with scheduling an appt with Dr. Mardelle Matte for a medication or if she should wait until there are signs.

## 2021-07-23 NOTE — Telephone Encounter (Signed)
LVM for patient to return call. 

## 2021-11-04 ENCOUNTER — Encounter: Payer: Self-pay | Admitting: Family Medicine

## 2021-11-04 ENCOUNTER — Other Ambulatory Visit: Payer: Self-pay

## 2021-11-04 ENCOUNTER — Ambulatory Visit (INDEPENDENT_AMBULATORY_CARE_PROVIDER_SITE_OTHER): Payer: Medicare Other | Admitting: Family Medicine

## 2021-11-04 VITALS — BP 152/98 | HR 77 | Temp 98.0°F | Ht 70.0 in | Wt 199.0 lb

## 2021-11-04 DIAGNOSIS — L989 Disorder of the skin and subcutaneous tissue, unspecified: Secondary | ICD-10-CM | POA: Diagnosis not present

## 2021-11-04 NOTE — Progress Notes (Signed)
° °  Subjective:     Patient ID: Tina Dalton, female    DOB: 1951/05/22, 71 y.o.   MRN: [id number]  Chief Complaint  Patient presents with   Skin Tag    On back, it is sore. Noticed 6 months ago and it is growing.     HPI Past 6 mo, skin tag on back.  Getting larger and tender. Itchy at times.not bleeding. Has moles removed in past-no cancers.  Dad had skin cancers  Health Maintenance Due  Topic Date Due   COVID-19 Vaccine (5 - Booster for Pfizer series) 02/21/2021    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/2020   Hypothyroidism    Mixed hyperlipidemia 04/01/2021   ASCVD 8.3% 03/2021; offered statin   Osteoarthritis of knee    Osteopenia after menopause 04/01/2020   DEXA July 2020: Lowest T score equals -1.9    Past Surgical History:  Procedure Laterality Date   CATARACT EXTRACTION W/ INTRAOCULAR LENS IMPLANT  2022   S/P Knee replacement   03/2015    Outpatient Medications Prior to Visit  Medication Sig Dispense Refill   levothyroxine (SYNTHROID) 112 MCG tablet Take 1 tablet (112 mcg total) by mouth daily before breakfast. 90 tablet 3   PFIZER-BIONT COVID-19 VAC-TRIS SUSP injection      No facility-administered medications prior to visit.    No Known Allergies [id number] except as noted in HPI      Objective:     BP (!) 152/98    Pulse 77    Temp 98 F (36.7 C) (Temporal)    Ht 5\' 10"  (1.778 m)    Wt 199 lb (90.3 kg)    LMP  (LMP Unknown)    SpO2 98%    BMI 28.55 kg/m  Wt Readings from Last 3 Encounters:  11/04/21 199 lb (90.3 kg)  03/31/21 196 lb 9.6 oz (89.2 kg)  03/19/21 197 lb (89.4 kg)        Gen: WDWN NAD WF HEENT: NCAT, conjunctiva not injected, sclera nonicteric MSK: no gross abnormalities.  NEURO: A&O x3.  CN II-XII intact.  PSYCH: normal mood. Good eye contact Skin: has some Sk's and other barnicles On L upper back-approx 1cm annular w/red base  around it, papule w/thickened skin and umbilicated center   Assessment & Plan:   Problem List Items Addressed This Visit   None Visit Diagnoses     Skin lesion of back    -  Primary   Relevant Orders   Ambulatory referral to Dermatology      Skin lesion back-?BCC vs inflammed lesion-tender, getting larger over 6 mo-to derm  No orders of the defined types were placed in this encounter.   Wellington Hampshire, MD

## 2021-11-04 NOTE — Patient Instructions (Signed)
It was very nice to see you today!  Will do referral to derm Am Lactin lotion    PLEASE NOTE:  If you had any lab tests please let us know if you have not heard back within a few days. You may see your results on MyChart before we have a chance to review them but we will give you a call once they are reviewed by [place]. If we ordered any referrals today, please let us know if you have not heard from their office within the next week.   Please try these tips to maintain a healthy lifestyle:  Eat most of your calories during the day when you are active. Eliminate processed foods including packaged sweets (pies, cakes, cookies), reduce intake of potatoes, white bread, white pasta, and white rice. Look for whole grain options, oat flour or almond flour.  Each meal should contain half fruits/vegetables, one quarter protein, and one quarter carbs (no bigger than a computer mouse).  Cut down on sweet beverages. This includes juice, soda, and sweet tea. Also watch fruit intake, though this is a healthier sweet option, it still contains natural sugar! Limit to 3 servings daily.  Drink at least 1 glass of water with each meal and aim for at least 8 glasses per day  Exercise at least 150 minutes every week.

## 2022-01-21 ENCOUNTER — Other Ambulatory Visit: Payer: Self-pay

## 2022-01-21 ENCOUNTER — Telehealth: Payer: Self-pay

## 2022-01-21 MED ORDER — LEVOTHYROXINE SODIUM 112 MCG PO TABS: 112.0000 ug | ORAL_TABLET | Freq: Every day | ORAL | 3 refills | Status: DC

## 2022-01-21 NOTE — Telephone Encounter (Signed)
Rx sent 

## 2022-01-21 NOTE — Telephone Encounter (Signed)
..   Encourage patient to contact the pharmacy for refills or they can request refills through [facility]  LAST APPOINTMENT DATE:  03/31/2021  NEXT APPOINTMENT DATE:  05/08/2022  MEDICATION:  levothyroxine  Is the patient out of medication?   PHARMACY: [facility] on [address].  Let patient know to contact pharmacy at the end of the day to make sure medication is ready.  Please notify patient to allow 48-72 hours to process  CLINICAL FILLS OUT ALL BELOW:   LAST REFILL:  QTY:  REFILL DATE:    OTHER COMMENTS:    Okay for refill?  Please advise

## 2022-03-05 ENCOUNTER — Other Ambulatory Visit: Payer: Self-pay | Admitting: Family Medicine

## 2022-03-05 DIAGNOSIS — Z1231 Encounter for screening mammogram for malignant neoplasm of breast: Secondary | ICD-10-CM

## 2022-03-19 ENCOUNTER — Other Ambulatory Visit: Payer: Self-pay | Admitting: Family Medicine

## 2022-03-30 ENCOUNTER — Encounter (HOSPITAL_BASED_OUTPATIENT_CLINIC_OR_DEPARTMENT_OTHER): Payer: Self-pay

## 2022-03-30 ENCOUNTER — Other Ambulatory Visit: Payer: Self-pay

## 2022-03-30 ENCOUNTER — Emergency Department (HOSPITAL_BASED_OUTPATIENT_CLINIC_OR_DEPARTMENT_OTHER)
Admission: EM | Admit: 2022-03-30 | Discharge: 2022-03-30 | Discharge status: Against Medical Advice | Payer: Medicare Other | Attending: Emergency Medicine | Admitting: Emergency Medicine

## 2022-03-30 DIAGNOSIS — Z5321 Procedure and treatment not carried out due to patient leaving prior to being seen by health care provider: Secondary | ICD-10-CM | POA: Diagnosis not present

## 2022-03-30 DIAGNOSIS — R002 Palpitations: Secondary | ICD-10-CM | POA: Insufficient documentation

## 2022-03-30 NOTE — ED Triage Notes (Signed)
Pt states she was at the dentist when she began having palpitations. Pt was given a local anesthetic that included 0.034mg  of epi. Pt denies cp or sob. States the palpitations lasted about , but reports they have resolved

## 2022-04-16 ENCOUNTER — Ambulatory Visit: Payer: Medicare Other

## 2022-04-17 ENCOUNTER — Ambulatory Visit
Admission: RE | Admit: 2022-04-17 | Discharge: 2022-04-17 | Disposition: A | Discharge status: Home / Self Care | Payer: Medicare Other | Source: Ambulatory Visit | Attending: Family Medicine | Admitting: Family Medicine

## 2022-04-17 DIAGNOSIS — Z1231 Encounter for screening mammogram for malignant neoplasm of breast: Secondary | ICD-10-CM

## 2022-05-08 ENCOUNTER — Ambulatory Visit (INDEPENDENT_AMBULATORY_CARE_PROVIDER_SITE_OTHER): Payer: Medicare Other | Admitting: Family Medicine

## 2022-05-08 ENCOUNTER — Encounter: Payer: Self-pay | Admitting: Family Medicine

## 2022-05-08 VITALS — BP 138/82 | HR 63 | Temp 98.2°F | Ht 70.0 in | Wt 182.8 lb

## 2022-05-08 DIAGNOSIS — R002 Palpitations: Secondary | ICD-10-CM

## 2022-05-08 DIAGNOSIS — E782 Mixed hyperlipidemia: Secondary | ICD-10-CM

## 2022-05-08 DIAGNOSIS — M8580 Other specified disorders of bone density and structure, unspecified site: Secondary | ICD-10-CM | POA: Diagnosis not present

## 2022-05-08 DIAGNOSIS — E039 Hypothyroidism, unspecified: Secondary | ICD-10-CM | POA: Diagnosis not present

## 2022-05-08 DIAGNOSIS — Z0000 Encounter for general adult medical examination without abnormal findings: Secondary | ICD-10-CM

## 2022-05-08 DIAGNOSIS — Z780 Asymptomatic menopausal state: Secondary | ICD-10-CM | POA: Diagnosis not present

## 2022-05-08 NOTE — Patient Instructions (Addendum)
Please return in 12 months for your annual complete physical; please come fasting.   I will release your lab results to you on your MyChart account with further instructions. You may see the results before I do, but when I review them I will send you a message with my report or have my assistant call you if things need to be discussed. Please reply to my message with any questions. Thank you!   If you have any questions or concerns, please don't hesitate to send me a message via MyChart or call the office at [phone]. Thank you for visiting with [place] today! It's our pleasure caring for you.  I have ordered a mammogram and/or bone density for you as we discussed today: []   Mammogram  [x]   Bone Density  Please call the office checked below to schedule your appointment:  [x]   [facility]      [address] [place],        [address] Wing         []   [facility]  [address] [place],  [facility]  Calcium Intake Recommendations You can take Caltrate Plus twice a day or get it through your diet or other OTC supplements (Viactiv, OsCal etc)  Calcium is a mineral that affects many functions in the body, including: Blood clotting. Blood vessel function. Nerve impulse conduction. Hormone secretion. Muscle contraction. Bone and teeth functions.  Most of your body's calcium supply is stored in your bones and teeth. When your calcium stores are low, you may be at risk for low bone mass, bone loss, and bone fractures. Consuming enough calcium helps to grow healthy bones and teeth and to prevent breakdown over time. It is very important that you get enough calcium if you are: A child undergoing rapid growth. An adolescent girl. A pre- or post-menopausal woman. A woman whose menstrual cycle has stopped due to anorexia nervosa or regular intense exercise. An individual with lactose intolerance or a milk allergy. A  vegetarian.  What is my plan? Try to consume the recommended amount of calcium daily based on your age. Depending on your overall health, your health care provider may recommend increased calcium intake. General daily calcium intake recommendations by age are: Birth to 6 months: 200 mg. Infants 7 to 12 months: 260 mg. Children 1 to 3 years: 700 mg. Children 4 to 8 years: 1,000 mg. Children 9 to 13 years: 1,300 mg. Teens 14 to 18 years: 1,300 mg. Adults 19 to 50 years: 1,000 mg. Adult women 51 to 70 years: 1,200 mg. Adult men 51 to 70 years: 1,000 mg. Adults 71 years and older: 1,200 mg. Pregnant and breastfeeding teens: 1,300 mg. Pregnant and breastfeeding adults: 1,000 mg.  What do I need to know about calcium intake? In order for the body to absorb calcium, it needs vitamin D. You can get vitamin D through (we recommend getting [phone] units of Vitamin D daily) Direct exposure of the skin to sunlight. Foods, such as egg yolks, liver, saltwater fish, and fortified milk. Supplements. Consuming too much calcium may cause: Constipation. Decreased absorption of iron and zinc. Kidney stones. Calcium supplements may interact with certain medicines. Check with your health care provider before starting any calcium supplements. Try to get most of your calcium from food. What foods can I eat? Grains  Fortified oatmeal. Fortified ready-to-eat cereals. Fortified frozen waffles. Vegetables Turnip greens. Broccoli. Fruits  Fortified orange juice. Meats and Other Protein Sources Canned sardines with bones. Canned salmon with bones. Soy beans. Tofu. Baked beans. Almonds. [place] nuts. Sunflower seeds. Dairy Milk. Yogurt. Cheese. Cottage cheese. Beverages Fortified soy milk. Fortified rice milk. Sweets/Desserts Pudding. Ice Cream. Milkshakes. Blackstrap molasses. The items listed above may not be a complete list of recommended foods or beverages. Contact your dietitian for more  options. What foods can affect my calcium intake? It may be more difficult for your body to use calcium or calcium may leave your body more quickly if you consume large amounts of: Sodium. Protein. Caffeine. Alcohol.  This information is not intended to replace advice given to you by your health care provider. Make sure you discuss any questions you have with your health care provider. Document Released: 04/21/2004 Document Revised: 03/27/2016 Document Reviewed: 02/13/2014 Elsevier Interactive Patient Education  2018 Elsevier Inc.  High Cholesterol  High cholesterol is a condition in which the blood has high levels of a white, waxy substance similar to fat (cholesterol). The liver makes all the cholesterol that the body needs. The human body needs small amounts of cholesterol to help build cells. A person gets extra or excess cholesterol from the food that he or she eats. The blood carries cholesterol from the liver to the rest of the body. If you have high cholesterol, deposits (plaques) may build up on the walls of your arteries. Arteries are the blood vessels that carry blood away from your heart. These plaques make the arteries narrow and stiff. Cholesterol plaques increase your risk for heart attack and stroke. Work with your health care provider to keep your cholesterol levels in a healthy range. What increases the risk? The following factors may make you more likely to develop this condition: Eating foods that are high in animal fat (saturated fat) or cholesterol. Being overweight. Not getting enough exercise. A family history of high cholesterol (familial hypercholesterolemia). Use of tobacco products. Having diabetes. What are the signs or symptoms? In most cases, high cholesterol does not usually cause any symptoms. In severe cases, very high cholesterol levels can cause: Fatty bumps under the skin (xanthomas). A white or gray ring around the black center (pupil) of the  eye. How is this diagnosed? This condition may be diagnosed based on the results of a blood test. If you are older than 69 years of age, your health care provider may check your cholesterol levels every 4-6 years. You may be checked more often if you have high cholesterol or other risk factors for heart disease. The blood test for cholesterol measures: "Bad" cholesterol, or LDL cholesterol. This is the main type of cholesterol that causes heart disease. The desired level is less than 100 mg/dL (9.03 mmol/L). "Good" cholesterol, or HDL cholesterol. HDL helps protect against heart disease by cleaning the arteries and carrying the LDL to the liver for processing. The desired level for HDL is 60 mg/dL (0.09 mmol/L) or higher. Triglycerides. These are fats that your body can store or burn for energy. The desired level is less than 150 mg/dL (2.33 mmol/L). Total cholesterol. This measures the total amount of cholesterol in your blood and includes LDL, HDL, and triglycerides. The desired level is less than 200 mg/dL (0.07 mmol/L). How is this treated? Treatment for high cholesterol starts with lifestyle changes, such as diet and exercise. Diet changes. You may be asked to eat foods that have more fiber and less saturated fats or added sugar. Lifestyle changes. These may include regular exercise,  maintaining a healthy weight, and quitting use of tobacco products. Medicines. These are given when diet and lifestyle changes have not worked. You may be prescribed a statin medicine to help lower your cholesterol levels. Follow these instructions at home: Eating and drinking  Eat a healthy, balanced diet. This diet includes: Daily servings of a variety of fresh, frozen, or canned fruits and vegetables. Daily servings of whole grain foods that are rich in fiber. Foods that are low in saturated fats and trans fats. These include poultry and fish without skin, lean cuts of meat, and low-fat dairy products. A  variety of fish, especially oily fish that contain omega-3 fatty acids. Aim to eat fish at least 2 times a week. Avoid foods and drinks that have added sugar. Use healthy cooking methods, such as roasting, grilling, broiling, baking, poaching, steaming, and stir-frying. Do not fry your food except for stir-frying. If you drink alcohol: Limit how much you have to: 0-1 drink a day for women who are not pregnant. 0-2 drinks a day for men. Know how much alcohol is in a drink. In the [place] one drink equals one 12 oz bottle of beer (355 mL), one 5 oz glass of wine (148 mL), or one 1 oz glass of hard liquor (44 mL). Lifestyle  Get regular exercise. Aim to exercise for a total of 150 minutes a week. Increase your activity level by doing activities such as gardening, walking, and taking the stairs. Do not use any products that contain nicotine or tobacco. These products include cigarettes, chewing tobacco, and vaping devices, such as e-cigarettes. If you need help quitting, ask your health care provider. General instructions Take over-the-counter and prescription medicines only as told by your health care provider. Keep all follow-up visits. This is important. Where to find more information American Heart Association: www.heart.org National Heart, Lung, and Blood Institute: PopSteam.is Contact a health care provider if: You have trouble achieving or maintaining a healthy diet or weight. You are starting an exercise program. You are unable to stop smoking. Get help right away if: You have chest pain. You have trouble breathing. You have discomfort or pain in your jaw, neck, back, shoulder, or arm. You have any symptoms of a stroke. "BE FAST" is an easy way to remember the main warning signs of a stroke: B - Balance. Signs are dizziness, sudden trouble walking, or loss of balance. E - Eyes. Signs are trouble seeing or a sudden change in vision. F - Face. Signs are sudden weakness or numbness  of the face, or the face or eyelid drooping on one side. A - Arms. Signs are weakness or numbness in an arm. This happens suddenly and usually on one side of the body. S - Speech. Signs are sudden trouble speaking, slurred speech, or trouble understanding what people say. T - Time. Time to call emergency services. Write down what time symptoms started. You have other signs of a stroke, such as: A sudden, severe headache with no known cause. Nausea or vomiting. Seizure. These symptoms may represent a serious problem that is an emergency. Do not wait to see if the symptoms will go away. Get medical help right away. Call your local emergency services (911 in the [place] Do not drive yourself to the hospital. Summary Cholesterol plaques increase your risk for heart attack and stroke. Work with your health care provider to keep your cholesterol levels in a healthy range. Eat a healthy, balanced diet, get regular exercise, and maintain a healthy  weight. Do not use any products that contain nicotine or tobacco. These products include cigarettes, chewing tobacco, and vaping devices, such as e-cigarettes. Get help right away if you have any symptoms of a stroke. This information is not intended to replace advice given to you by your health care provider. Make sure you discuss any questions you have with your health care provider. Document Revised: 11/21/2020 Document Reviewed: 11/11/2020 Elsevier Patient Education  2023 [employer].

## 2022-05-08 NOTE — Progress Notes (Signed)
Subjective  Chief Complaint  Patient presents with   Annual Exam    Pt here for Annual Exam and is not currently    HPI: Tina Dalton is a 71 y.o. female who presents to [facility] [place] today for a Female Wellness Visit. She also has the concerns and/or needs as listed above in the chief complaint. These will be addressed in addition to the Health Maintenance Visit.   Wellness Visit: annual visit with health maintenance review and exam without Pap  HM: due dexa screen/f/u osteopenia. No ca or vit D. Diet is slim fast twice daily and one meal x 2 years. Does for weight maintenance and ease. Walks twice weekly. Crc screen and mammo are normal and current.  Chronic disease f/u and/or acute problem visit: (deemed necessary to be done in addition to the wellness visit): Palpitations: had 2 separate episodes of palpitations: once at the dentist that lasted about 30 minutes: no associated sxs. ER EKG w/ NSR and nonspecific ST changes. No comparison. Did not stay for eval. And then it occurred a few weeks later but lasted only 30 seconds. Felt the sensation of a heavy heartbeat. Not racing. No sweats, sob, n/v or cp.  Hypothyroidism: on levothyroxine 112 mcg daily and feels well (+ palpitations as above). No weight loss or anxiety. No tremor. No stress HLD: discussed elevated LDL from last year. Healthy diet. +FH HTN and CAD (late).    Assessment  1. Annual physical exam   2. Acquired hypothyroidism   3. Mixed hyperlipidemia   4. Osteopenia after menopause   5. Asymptomatic menopausal state   6. Palpitations      Plan  Female Wellness Visit: Age appropriate Health Maintenance and Prevention measures were discussed with patient. Included topics are cancer screening recommendations, ways to keep healthy (see AVS) including dietary and exercise recommendations, regular eye and dental care, use of seat belts, and avoidance of moderate alcohol use and tobacco use.  BMI:  discussed patient's BMI and encouraged positive lifestyle modifications to help get to or maintain a target BMI. HM needs and immunizations were addressed and ordered. See below for orders. See HM and immunization section for updates. Routine labs and screening tests ordered including cmp, cbc and lipids where appropriate. Discussed recommendations regarding Vit D and calcium supplementation (see AVS)  Chronic disease management visit and/or acute problem visit: Palpitations: brief w/o associated sxs. Will check thyroid and labs. Encourage hydration and monitor for recurrence. Will evaluate further with heart monitor and echo if recur. Pt understands. Discussed red flag emergent sxs to watch for.  Prehypertension: montior.  HLD: will recheck nonfasting lipids and start lipitor if indicated. Education given.  Dexa to monitor osteopenia. Educated onvit D and calcium supplements.  Low thyroid: check TSH.   Follow up: 12 mo for cpe  Orders Placed This Encounter  Procedures   DG Bone Density   CBC with Differential/Platelet   Comprehensive metabolic panel   Lipid panel   TSH   VITAMIN D 25 Hydroxy (Vit-D Deficiency, Fractures)   Vitamin B12   No orders of the defined types were placed in this encounter.     Body mass index is 26.23 kg/m. 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 01/02/2020    Priority: Low   History of bilateral knee replacement 01/02/2020    Priority: Low   Vitamin D deficiency 05/29/2019    Priority: Low   Osteopenia after menopause 04/01/2020    DEXA July 2020:  Lowest T score equals -1.9    Heart murmur, systolic 01/02/2020   Health Maintenance  Topic Date Due   DEXA SCAN  03/15/2022   INFLUENZA VACCINE  04/21/2022   COVID-19 Vaccine (5 - Pfizer series) 05/24/2022 (Originally 02/21/2021)   MAMMOGRAM  04/18/2023   Fecal DNA (Cologuard)  04/21/2024   TETANUS/TDAP  12/02/2030   Pneumonia Vaccine 42+ Years old  Completed   Hepatitis C Screening  Completed   Zoster Vaccines- Shingrix  Completed   HPV VACCINES  Aged Out   Immunization History  Administered Date(s) Administered   Fluad Quad(high Dose 65+) 05/24/2019, 08/06/2021   Influenza, High Dose Seasonal PF 06/17/2018   Influenza,inj,Quad PF,6+ Mos 10/06/2017   PFIZER(Purple Top)SARS-COV-2 Vaccination 10/11/2019, 11/01/2019, 06/22/2020, 12/27/2020   Pneumococcal Conjugate-13 01/02/2020   Pneumococcal Polysaccharide-23 04/23/2021   Tdap 11/17/2017, 12/01/2020   Zoster Recombinat (Shingrix) 05/30/2021, 07/25/2021   We updated and reviewed the patient's past history in detail and it is documented below. Allergies: Patient has No Known Allergies. Past Medical History Patient  has a past medical history of Heart murmur, systolic (01/02/2020), History of bilateral knee replacement (01/02/2020), Hypothyroidism, Mixed hyperlipidemia (04/01/2021), Osteoarthritis of knee, and Osteopenia after menopause (04/01/2020). Past Surgical History Patient  has a past surgical history that includes S/P Knee replacement  (03/2015) and Cataract extraction w/ intraocular lens implant (2022). Family History: Patient family history includes Alzheimer's disease in her mother; Cancer in her father; Coronary artery disease in her father; Hypertension in her father. Social History:  Patient  reports that she has never smoked. She has never used smokeless tobacco. She reports that she does not currently use alcohol. She reports that she does not use drugs.  Review of Systems: Constitutional: negative for fever or  malaise Ophthalmic: negative for photophobia, double vision or loss of vision Cardiovascular: negative for chest pain, dyspnea on exertion, or new LE swelling Respiratory: negative for SOB or persistent cough Gastrointestinal: negative for abdominal pain, change in bowel habits or melena Genitourinary: negative for dysuria or gross hematuria, no abnormal uterine bleeding or disharge Musculoskeletal: negative for new gait disturbance or muscular weakness Integumentary: negative for new or persistent rashes, no breast lumps Neurological: negative for TIA or stroke symptoms Psychiatric: negative for SI or delusions Allergic/Immunologic: negative for hives  Patient Care Team    Relationship Specialty Notifications Start End  Willow Ora, MD PCP - General Family Medicine  01/02/20     Objective  Vitals: BP 138/82   Pulse 63   Temp 98.2 F (36.8 C)   Ht 5\' 10"  (1.778 m)   Wt 182 lb 12.8 oz (82.9 kg)   LMP  (LMP Unknown)   SpO2 97%   BMI 26.23 kg/m  General:  Well developed, well nourished, no acute distress  Psych:  Alert and orientedx3,normal mood and affect HEENT:  Normocephalic, atraumatic, non-icteric sclera,  supple neck without adenopathy, mass or thyromegaly Cardiovascular:  Normal S1, S2, RRR without gallop, rub or murmur Respiratory:  Good breath sounds bilaterally, CTAB with normal respiratory effort Gastrointestinal:  normal bowel sounds, soft, non-tender, no noted masses. No HSM MSK: no deformities, contusions. Joints are without erythema or swelling.  Skin:  Warm, no rashes or suspicious lesions noted Neurologic:    Mental status is normal. CN 2-11 are normal. Gross motor and sensory exams are normal. Normal gait. No tremor    Commons side effects, risks, benefits, and alternatives for medications and treatment plan prescribed today were discussed, and the patient expressed understanding of the given instructions. Patient is instructed to call or message via MyChart  if he/she has any questions or concerns regarding our treatment plan. No barriers to understanding were identified. We discussed Red Flag symptoms and signs in detail. Patient expressed understanding regarding what to do in case of urgent or emergency type symptoms.  Medication list was reconciled, printed and provided to the patient in AVS. Patient instructions and summary information was reviewed with the patient as documented in the AVS. This note was prepared with assistance of Dragon voice recognition software. Occasional wrong-word or sound-a-like substitutions may have occurred due to the inherent limitations of voice recognition software  This visit occurred during the SARS-CoV-2 public health emergency.  Safety protocols were in place, including screening questions prior to the visit, additional usage of staff PPE, and extensive cleaning of exam room while observing appropriate contact time as indicated for disinfecting solutions.

## 2022-05-15 ENCOUNTER — Telehealth: Payer: Self-pay | Admitting: Family Medicine

## 2022-05-15 NOTE — Addendum Note (Signed)
Addended by: Lorn Junes on: 05/15/2022 02:41 PM   Modules accepted: Orders

## 2022-05-15 NOTE — Telephone Encounter (Addendum)
Please call and reschedule a lab appt to have patients blood work redraw they was a error in the lab. Thank u. I put the orders back in and going to see in the front office can make her appt

## 2022-05-18 ENCOUNTER — Other Ambulatory Visit: Payer: Self-pay | Admitting: Family Medicine

## 2022-05-18 DIAGNOSIS — Z780 Asymptomatic menopausal state: Secondary | ICD-10-CM

## 2022-05-18 DIAGNOSIS — Z0000 Encounter for general adult medical examination without abnormal findings: Secondary | ICD-10-CM

## 2022-05-18 DIAGNOSIS — M8580 Other specified disorders of bone density and structure, unspecified site: Secondary | ICD-10-CM

## 2022-05-26 ENCOUNTER — Telehealth: Payer: Self-pay | Admitting: Family Medicine

## 2022-05-26 ENCOUNTER — Other Ambulatory Visit: Payer: Self-pay

## 2022-05-26 DIAGNOSIS — R002 Palpitations: Secondary | ICD-10-CM

## 2022-05-26 NOTE — Telephone Encounter (Signed)
Patient would like a referral to cardiology - patients heart arrhythmia has not  gotten better not have they stopped - Daughter is being evaluated by cardiologist for PSVT and father died of heart attack at age of 87.-   Would like a call once referral has been placed.

## 2022-05-26 NOTE — Telephone Encounter (Signed)
Referral has been placed and pt has been notified  

## 2022-05-28 ENCOUNTER — Telehealth: Payer: Self-pay | Admitting: Family Medicine

## 2022-05-28 NOTE — Telephone Encounter (Signed)
Copied from CRM [id number]. Topic: Medicare AWV >> May 28, 2022  9:45 AM Payton Doughty wrote: Reason for CRM: Called patient to schedule Annual Wellness Visit.  Please schedule with Nurse Health Advisor Lanier Ensign, RN at [facility]. This appt can be telephone or office visit. Please call [phone] ask for [facility]

## 2022-06-08 NOTE — Progress Notes (Unsigned)
Referring-Tina Mardelle Matte, MD Reason for referral-palpitations  HPI: 71 year old female for evaluation of palpitations at request of Asencion Partridge MD.  Echocardiogram April 2021 showed normal LV function, trace aortic insufficiency.  Patient recently complained of palpitations.  Cardiology asked to evaluate.  Current Outpatient Medications  Medication Sig Dispense Refill   levothyroxine (SYNTHROID) 112 MCG tablet TAKE 1 TABLET BY MOUTH EVERY DAY BEFORE BREAKFAST 90 tablet 3   No current facility-administered medications for this visit.    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   Hypothyroidism    Mixed hyperlipidemia 04/01/2021   ASCVD 8.3% 03/2021; offered statin   Osteoarthritis of knee    Osteopenia after menopause 04/01/2020   DEXA July 2020: Lowest T score equals -1.9    Past Surgical History:  Procedure Laterality Date   CATARACT EXTRACTION W/ INTRAOCULAR LENS IMPLANT  2022   S/P Knee replacement   03/2015    Social History   Socioeconomic History   Marital status: Single    Spouse name: Not on file   Number of children: 1   Years of education: Not on file   Highest education level: Not on file  Occupational History   Not on file  Tobacco Use   Smoking status: Never   Smokeless tobacco: Never  Vaping Use   Vaping Use: Never used  Substance and Sexual Activity   Alcohol use: Not Currently   Drug use: Never   Sexual activity: Yes  Other Topics Concern   Not on file  Social History Narrative   1 daughter.1 grand dau   Retired Systems analyst   Social Determinants of Corporate investment banker Strain: Low Risk  (04/03/2021)   Overall Financial Resource Strain (CARDIA)    Difficulty of Paying Living Expenses: Not hard at all  Food Insecurity: No Food Insecurity (04/03/2021)   Hunger Vital Sign    Worried About Running Out of Food in the Last Year:  Never true    Ran Out of Food in the Last Year: Never true  Transportation Needs: No Transportation Needs (04/03/2021)   PRAPARE - Administrator, Civil Service (Medical): No    Lack of Transportation (Non-Medical): No  Physical Activity: Sufficiently Active (04/03/2021)   Exercise Vital Sign    Days of Exercise per Week: 7 days    Minutes of Exercise per Session: 90 min  Stress: No Stress Concern Present (04/03/2021)   [employer] of Occupational Health - Occupational Stress Questionnaire    Feeling of Stress : Not at all  Social Connections: Socially Isolated (04/03/2021)   Social Connection and Isolation Panel [NHANES]    Frequency of Communication with Friends and Family: Three times a week    Frequency of Social Gatherings with Friends and Family: Twice a week    Attends Religious Services: Never    Database administrator or Organizations: No    Attends Banker Meetings: Never    Marital Status: Divorced  Catering manager Violence: Not At Risk (04/03/2021)   Humiliation, Afraid, Rape, and Kick questionnaire    Fear of Current or Ex-Partner: No    Emotionally Abused: No    Physically Abused: No    Sexually Abused: No    Family History  Problem Relation Age of Onset   Hypertension Father    Cancer Father  Skin   Coronary artery disease Father    Alzheimer's disease Mother     ROS: no fevers or chills, productive cough, hemoptysis, dysphasia, odynophagia, melena, hematochezia, dysuria, hematuria, rash, seizure activity, orthopnea, PND, pedal edema, claudication. Remaining systems are negative.  Physical Exam:   There were no vitals taken for this visit.  General:  Well developed/well nourished in NAD Skin warm/dry Patient not depressed No peripheral clubbing Back-normal HEENT-normal/normal eyelids Neck supple/normal carotid upstroke bilaterally; no bruits; no JVD; no thyromegaly chest - CTA/ normal expansion CV - RRR/normal S1 and  S2; no murmurs, rubs or gallops;  PMI nondisplaced Abdomen -NT/ND, no HSM, no mass, + bowel sounds, no bruit 2+ femoral pulses, no bruits Ext-no edema, chords, 2+ DP Neuro-grossly nonfocal  ECG -March 30, 2022-normal sinus rhythm, left ventricular hypertrophy.  Personally reviewed  A/P  1 palpitations-  2 hyperlipidemia-  Kirk Ruths, MD

## 2022-06-09 ENCOUNTER — Ambulatory Visit (INDEPENDENT_AMBULATORY_CARE_PROVIDER_SITE_OTHER): Payer: Medicare Other

## 2022-06-09 ENCOUNTER — Ambulatory Visit: Payer: Medicare Other | Attending: Cardiology | Admitting: Cardiology

## 2022-06-09 ENCOUNTER — Encounter: Payer: Self-pay | Admitting: Cardiology

## 2022-06-09 VITALS — BP 140/88 | HR 74 | Ht 70.0 in | Wt 189.8 lb

## 2022-06-09 DIAGNOSIS — R002 Palpitations: Secondary | ICD-10-CM

## 2022-06-09 DIAGNOSIS — E7800 Pure hypercholesterolemia, unspecified: Secondary | ICD-10-CM

## 2022-06-09 NOTE — Progress Notes (Unsigned)
Enrolled for [employer] to mail a ZIO XT long term holter monitor to the patients address on file.  

## 2022-06-09 NOTE — Patient Instructions (Signed)
Testing/Procedures:  CORONARY CALCIUM SCORING CT SCAN AT THE [facility] OFFICE  Your physician has requested that you have an echocardiogram. Echocardiography is a painless test that uses sound waves to create images of your heart. It provides your doctor with information about the size and shape of your heart and how well your heart's chambers and valves are working. This procedure takes approximately one hour. There are no restrictions for this procedure. [facility]   ZIO XT- Long Term Monitor Instructions  Your physician has requested you wear a ZIO patch monitor for 14 days.  This is a single patch monitor. [employer] supplies one patch monitor per enrollment. Additional stickers are not available. Please do not apply patch if you will be having a Nuclear Stress Test,  Echocardiogram, Cardiac CT, MRI, or Chest Xray during the period you would be wearing the  monitor. The patch cannot be worn during these tests. You cannot remove and re-apply the  ZIO XT patch monitor.  Your ZIO patch monitor will be mailed 3 day USPS to your address on file. It may take 3-5 days  to receive your monitor after you have been enrolled.  Once you have received your monitor, please review the enclosed instructions. Your monitor  has already been registered assigning a specific monitor serial # to you.  Billing and Patient Assistance Program Information  We have supplied [employer] with any of your insurance information on file for billing purposes. [employer] offers a sliding scale Patient Assistance Program for patients that do not have  insurance, or whose insurance does not completely cover the cost of the ZIO monitor.  You must apply for the Patient Assistance Program to qualify for this discounted rate.  To apply, please call [employer] at [phone], select option 4, select option 2, ask to apply for  Patient Assistance Program. Meredeth Ide will ask your household income, and how many people  are in your  household. They will quote your out-of-pocket cost based on that information.  [employer] will also be able to set up a 26-month, interest-free payment plan if needed.  Applying the monitor   Shave hair from upper left chest.  Hold abrader disc by orange tab. Rub abrader in 40 strokes over the upper left chest as  indicated in your monitor instructions.  Clean area with 4 enclosed alcohol pads. Let dry.  Apply patch as indicated in monitor instructions. Patch will be placed under collarbone on left  side of chest with arrow pointing upward.  Rub patch adhesive wings for 2 minutes. Remove white label marked "1". Remove the white  label marked "2". Rub patch adhesive wings for 2 additional minutes.  While looking in a mirror, press and release button in center of patch. A small green light will  flash 3-4 times. This will be your only indicator that the monitor has been turned on.  Do not shower for the first 24 hours. You may shower after the first 24 hours.  Press the button if you feel a symptom. You will hear a small click. Record Date, Time and  Symptom in the Patient Logbook.  When you are ready to remove the patch, follow instructions on the last 2 pages of Patient  Logbook. Stick patch monitor onto the last page of Patient Logbook.  Place Patient Logbook in the blue and white box. Use locking tab on box and tape box closed  securely. The blue and white box has prepaid postage on it. Please place it in the mailbox as  soon as possible. Your physician should have your test results approximately 7 days after the  monitor has been mailed back to [facility].  Call [place] at [phone] if you have questions regarding  your ZIO XT patch monitor. Call them immediately if you see an orange light blinking on your  monitor.  If your monitor falls off in less than 4 days, contact our Monitor department at [phone].  If your monitor becomes loose or falls off after  4 days call [employer] at [phone] for  suggestions on securing your monitor    Follow-Up: At [facility], you and your health needs are our priority.  As part of our continuing mission to provide you with exceptional heart care, we have created designated Provider Care Teams.  These Care Teams include your primary Cardiologist (physician) and Advanced Practice Providers (APPs -  Physician Assistants and Nurse Practitioners) who all work together to provide you with the care you need, when you need it.  We recommend signing up for the patient portal called "MyChart".  Sign up information is provided on this After Visit Summary.  MyChart is used to connect with patients for Virtual Visits (Telemedicine).  Patients are able to view lab/test results, encounter notes, upcoming appointments, etc.  Non-urgent messages can be sent to your provider as well.   To learn more about what you can do with MyChart, go to [email or web address].    Your next appointment:   6 month(s)  The format for your next appointment:   In Person  Provider:   Kirk Ruths MD

## 2022-06-12 DIAGNOSIS — R002 Palpitations: Secondary | ICD-10-CM | POA: Diagnosis not present

## 2022-06-15 ENCOUNTER — Encounter: Payer: Self-pay | Admitting: *Deleted

## 2022-06-15 NOTE — Addendum Note (Signed)
Addended by: Orma Render on: 06/15/2022 08:57 AM   Modules accepted: Orders

## 2022-06-17 ENCOUNTER — Ambulatory Visit (HOSPITAL_BASED_OUTPATIENT_CLINIC_OR_DEPARTMENT_OTHER): Payer: Medicare Other | Admitting: Cardiology

## 2022-07-15 ENCOUNTER — Ambulatory Visit (HOSPITAL_BASED_OUTPATIENT_CLINIC_OR_DEPARTMENT_OTHER)
Admission: RE | Admit: 2022-07-15 | Discharge: 2022-07-15 | Disposition: A | Discharge status: Home / Self Care | Payer: Medicare Other | Source: Ambulatory Visit | Attending: Cardiology | Admitting: Cardiology

## 2022-07-15 ENCOUNTER — Ambulatory Visit (INDEPENDENT_AMBULATORY_CARE_PROVIDER_SITE_OTHER): Payer: Medicare Other

## 2022-07-15 DIAGNOSIS — I080 Rheumatic disorders of both mitral and aortic valves: Secondary | ICD-10-CM | POA: Diagnosis not present

## 2022-07-15 DIAGNOSIS — I517 Cardiomegaly: Secondary | ICD-10-CM | POA: Diagnosis not present

## 2022-07-15 DIAGNOSIS — R002 Palpitations: Secondary | ICD-10-CM | POA: Diagnosis not present

## 2022-07-15 DIAGNOSIS — I5030 Unspecified diastolic (congestive) heart failure: Secondary | ICD-10-CM

## 2022-07-15 LAB — ECHOCARDIOGRAM COMPLETE
Area-P 1/2: 3.91 cm2
MV M vel: 5.45 m/s
MV Peak grad: 118.8 mmHg
P 1/2 time: 1007 msec
S' Lateral: 2.33 cm

## 2022-07-16 ENCOUNTER — Ambulatory Visit (INDEPENDENT_AMBULATORY_CARE_PROVIDER_SITE_OTHER): Payer: Medicare Other

## 2022-07-16 ENCOUNTER — Telehealth: Payer: Self-pay | Admitting: Cardiology

## 2022-07-16 VITALS — Wt 189.0 lb

## 2022-07-16 DIAGNOSIS — Z0000 Encounter for general adult medical examination without abnormal findings: Secondary | ICD-10-CM

## 2022-07-16 MED ORDER — METOPROLOL SUCCINATE ER 25 MG PO TB24: 25.0000 mg | ORAL_TABLET | Freq: Every day | ORAL | 3 refills | Status: DC

## 2022-07-16 NOTE — Patient Instructions (Signed)
Tina Dalton , Thank you for taking time to come for your Medicare Wellness Visit. I appreciate your ongoing commitment to your health goals. Please review the following plan we discussed and let me know if I can assist you in the future.   These are the goals we discussed:  Goals      Patient Stated     Stay healthy and go on vacation and lose 5 more pounds before trip     Patient Stated     Lose 20 lbs      Patient Stated     Stay active         This is a list of the screening recommended for you and due dates:  Health Maintenance  Topic Date Due   COVID-19 Vaccine (5 - Pfizer series) 02/21/2021   DEXA scan (bone density measurement)  03/15/2022   Mammogram  04/18/2023   Medicare Annual Wellness Visit  08/16/2023   Cologuard (Stool DNA test)  04/21/2024   Tetanus Vaccine  12/02/2030   Pneumonia Vaccine  Completed   Flu Shot  Completed   Hepatitis C Screening: USPSTF Recommendation to screen - Ages 18-79 yo.  Completed   Zoster (Shingles) Vaccine  Completed   HPV Vaccine  Aged Out    Advanced directives: copies in chart   Conditions/risks identified: stay active   Next appointment: Follow up in one year for your annual wellness visit    Preventive Care 65 Years and Older, Female Preventive care refers to lifestyle choices and visits with your health care provider that can promote health and wellness. What does preventive care include? A yearly physical exam. This is also called an annual well check. Dental exams once or twice a year. Routine eye exams. Ask your health care provider how often you should have your eyes checked. Personal lifestyle choices, including: Daily care of your teeth and gums. Regular physical activity. Eating a healthy diet. Avoiding tobacco and drug use. Limiting alcohol use. Practicing safe sex. Taking low-dose aspirin every day. Taking vitamin and mineral supplements as recommended by your health care provider. What happens during an  annual well check? The services and screenings done by your health care provider during your annual well check will depend on your age, overall health, lifestyle risk factors, and family history of disease. Counseling  Your health care provider may ask you questions about your: Alcohol use. Tobacco use. Drug use. Emotional well-being. Home and relationship well-being. Sexual activity. Eating habits. History of falls. Memory and ability to understand (cognition). Work and work Astronomer. Reproductive health. Screening  You may have the following tests or measurements: Height, weight, and BMI. Blood pressure. Lipid and cholesterol levels. These may be checked every 5 years, or more frequently if you are over 66 years old. Skin check. Lung cancer screening. You may have this screening every year starting at age 20 if you have a 30-pack-year history of smoking and currently smoke or have quit within the past 15 years. Fecal occult blood test (FOBT) of the stool. You may have this test every year starting at age 60. Flexible sigmoidoscopy or colonoscopy. You may have a sigmoidoscopy every 5 years or a colonoscopy every 10 years starting at age 4. Hepatitis C blood test. Hepatitis B blood test. Sexually transmitted disease (STD) testing. Diabetes screening. This is done by checking your blood sugar (glucose) after you have not eaten for a while (fasting). You may have this done every 1-3 years. Bone density scan. This is  done to screen for osteoporosis. You may have this done starting at age 54. Mammogram. This may be done every 1-2 years. Talk to your health care provider about how often you should have regular mammograms. Talk with your health care provider about your test results, treatment options, and if necessary, the need for more tests. Vaccines  Your health care provider may recommend certain vaccines, such as: Influenza vaccine. This is recommended every year. Tetanus,  diphtheria, and acellular pertussis (Tdap, Td) vaccine. You may need a Td booster every 10 years. Zoster vaccine. You may need this after age 35. Pneumococcal 13-valent conjugate (PCV13) vaccine. One dose is recommended after age 36. Pneumococcal polysaccharide (PPSV23) vaccine. One dose is recommended after age 55. Talk to your health care provider about which screenings and vaccines you need and how often you need them. This information is not intended to replace advice given to you by your health care provider. Make sure you discuss any questions you have with your health care provider. Document Released: 10/04/2015 Document Revised: 05/27/2016 Document Reviewed: 07/09/2015 Elsevier Interactive Patient Education  2017 [employer] Prevention in the Home Falls can cause injuries. They can happen to people of all ages. There are many things you can do to make your home safe and to help prevent falls. What can I do on the outside of my home? Regularly fix the edges of walkways and driveways and fix any cracks. Remove anything that might make you trip as you walk through a door, such as a raised step or threshold. Trim any bushes or trees on the path to your home. Use bright outdoor lighting. Clear any walking paths of anything that might make someone trip, such as rocks or tools. Regularly check to see if handrails are loose or broken. Make sure that both sides of any steps have handrails. Any raised decks and porches should have guardrails on the edges. Have any leaves, snow, or ice cleared regularly. Use sand or salt on walking paths during winter. Clean up any spills in your garage right away. This includes oil or grease spills. What can I do in the bathroom? Use night lights. Install grab bars by the toilet and in the tub and shower. Do not use towel bars as grab bars. Use non-skid mats or decals in the tub or shower. If you need to sit down in the shower, use a plastic,  non-slip stool. Keep the floor dry. Clean up any water that spills on the floor as soon as it happens. Remove soap buildup in the tub or shower regularly. Attach bath mats securely with double-sided non-slip rug tape. Do not have throw rugs and other things on the floor that can make you trip. What can I do in the bedroom? Use night lights. Make sure that you have a light by your bed that is easy to reach. Do not use any sheets or blankets that are too big for your bed. They should not hang down onto the floor. Have a firm chair that has side arms. You can use this for support while you get dressed. Do not have throw rugs and other things on the floor that can make you trip. What can I do in the kitchen? Clean up any spills right away. Avoid walking on wet floors. Keep items that you use a lot in easy-to-reach places. If you need to reach something above you, use a strong step stool that has a grab bar. Keep electrical cords out of  the way. Do not use floor polish or wax that makes floors slippery. If you must use wax, use non-skid floor wax. Do not have throw rugs and other things on the floor that can make you trip. What can I do with my stairs? Do not leave any items on the stairs. Make sure that there are handrails on both sides of the stairs and use them. Fix handrails that are broken or loose. Make sure that handrails are as long as the stairways. Check any carpeting to make sure that it is firmly attached to the stairs. Fix any carpet that is loose or worn. Avoid having throw rugs at the top or bottom of the stairs. If you do have throw rugs, attach them to the floor with carpet tape. Make sure that you have a light switch at the top of the stairs and the bottom of the stairs. If you do not have them, ask someone to add them for you. What else can I do to help prevent falls? Wear shoes that: Do not have high heels. Have rubber bottoms. Are comfortable and fit you well. Are closed  at the toe. Do not wear sandals. If you use a stepladder: Make sure that it is fully opened. Do not climb a closed stepladder. Make sure that both sides of the stepladder are locked into place. Ask someone to hold it for you, if possible. Clearly mark and make sure that you can see: Any grab bars or handrails. First and last steps. Where the edge of each step is. Use tools that help you move around (mobility aids) if they are needed. These include: Canes. Walkers. Scooters. Crutches. Turn on the lights when you go into a dark area. Replace any light bulbs as soon as they burn out. Set up your furniture so you have a clear path. Avoid moving your furniture around. If any of your floors are uneven, fix them. If there are any pets around you, be aware of where they are. Review your medicines with your doctor. Some medicines can make you feel dizzy. This can increase your chance of falling. Ask your doctor what other things that you can do to help prevent falls. This information is not intended to replace advice given to you by your health care provider. Make sure you discuss any questions you have with your health care provider. Document Released: 07/04/2009 Document Revised: 02/13/2016 Document Reviewed: 10/12/2014 Elsevier Interactive Patient Education  2017 [employer].

## 2022-07-16 NOTE — Progress Notes (Signed)
I connected with  Tina Dalton on 07/16/22 by a audio enabled telemedicine application and verified that I am speaking with the correct person using two identifiers.  Patient Location: Home  Provider Location: Office/Clinic  I discussed the limitations of evaluation and management by telemedicine. The patient expressed understanding and agreed to proceed.   Subjective:   Tina Dalton is a 71 y.o. female who presents for Medicare Annual (Subsequent) preventive examination.  Review of Systems     Cardiac Risk Factors include: advanced age (>21men, >106 women);dyslipidemia     Objective:    Today's Vitals   07/16/22 0832  Weight: 189 lb (85.7 kg)   Body mass index is 27.12 kg/m.     07/16/2022    8:34 AM 03/30/2022   12:28 PM 04/03/2021    1:46 PM 12/01/2020   11:43 AM 04/01/2020    1:18 PM  Advanced Directives  Does Patient Have a Medical Advance Directive? Yes Yes Yes No Yes  Type of Estate agent of [place];Living will Healthcare Power of [place];Living will Healthcare Power of [employer] of [place];Living will  Does patient want to make changes to medical advance directive? No - Patient declined      Copy of Healthcare Power of Attorney in Chart? Yes - validated most recent copy scanned in chart (See row information) No - copy requested Yes - validated most recent copy scanned in chart (See row information)  No - copy requested  Would patient like information on creating a medical advance directive?    No - Patient declined     Current Medications (verified) Outpatient Encounter Medications as of 07/16/2022  Medication Sig   levothyroxine (SYNTHROID) 112 MCG tablet TAKE 1 TABLET BY MOUTH EVERY DAY BEFORE BREAKFAST   No facility-administered encounter medications on file as of 07/16/2022.    Allergies (verified) Patient has no known allergies.   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   Hypothyroidism    Mixed hyperlipidemia 04/01/2021   ASCVD 8.3% 03/2021; offered statin   Osteoarthritis of knee    Osteopenia after menopause 04/01/2020   DEXA July 2020: Lowest T score equals -1.9   Past Surgical History:  Procedure Laterality Date   CATARACT EXTRACTION W/ INTRAOCULAR LENS IMPLANT  2022   S/P Knee replacement   03/2015   Family History  Problem Relation Age of Onset   Alzheimer's disease Mother    Heart attack Father    Hypertension Father    Cancer Father        Skin   Coronary artery disease Father    Social History   Socioeconomic History   Marital status: Divorced    Spouse name: Not on file   Number of children: 1   Years of education: Not on file   Highest education level: Not on file  Occupational History   Not on file  Tobacco Use   Smoking status: Never   Smokeless tobacco: Never  Vaping Use   Vaping Use: Never used  Substance and Sexual Activity   Alcohol use: Yes    Comment: Rare   Drug use: Never   Sexual activity: Yes  Other Topics Concern   Not on file  Social History Narrative   1 daughter.1 grand dau   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)   Overall Financial  Resource Strain (CARDIA)    Difficulty of Paying Living Expenses: Not hard at all  Food Insecurity: No Food Insecurity (07/16/2022)   Hunger Vital Sign    Worried About Running Out of Food in the Last Year: Never true    Ran Out of Food in the Last Year: Never true  Transportation Needs: No Transportation Needs (07/16/2022)   PRAPARE - Administrator, Civil Service (Medical): No    Lack of Transportation (Non-Medical): No  Physical Activity: Sufficiently Active (07/16/2022)   Exercise Vital Sign    Days of Exercise per Week: 7 days    Minutes of Exercise per Session: 30 min  Stress: No Stress Concern Present (07/16/2022)   [employer] of Occupational Health - Occupational Stress Questionnaire    Feeling of Stress : Not at all  Social Connections: Socially Isolated (07/16/2022)   Social Connection and Isolation Panel [NHANES]    Frequency of Communication with Friends and Family: Three times a week    Frequency of Social Gatherings with Friends and Family: More than three times a week    Attends Religious Services: Never    Database administrator or Organizations: No    Attends Engineer, structural: Never    Marital Status: Divorced    Tobacco Counseling Counseling given: Not Answered   Clinical Intake:  Pre-visit preparation completed: Yes  Pain : No/denies pain     BMI - recorded: 27.12 Nutritional Status: BMI 25 -29 Overweight Nutritional Risks: None Diabetes: No  How often do you need to have someone help you when you read instructions, pamphlets, or other written materials from your doctor or pharmacy?: 1 - Never  Diabetic?no  Interpreter Needed?: No  Information entered by :: Lanier Ensign, LPN   Activities of Daily Living    07/16/2022    8:38 AM 07/14/2022   11:38 AM  In your present state of health, do you have any difficulty performing the following activities:  Hearing? 0 0  Vision? 0 0  Difficulty concentrating or making decisions? 0 0  Walking or climbing stairs? 0 0  Dressing or bathing? 0 0  Doing errands, shopping? 0 0  Preparing Food and eating ? N N  Using the Toilet? N N  In the past six months, have you accidently leaked urine? N N  Do you have problems with loss of bowel control? N N  Managing your Medications? N N  Managing your Finances? N N  Housekeeping or managing your Housekeeping? N N    Patient Care Team: Willow Ora, MD as PCP - General (Family Medicine) Jens Som Madolyn Frieze, MD as PCP - Cardiology (Cardiology)  Indicate any recent Medical Services you may have received from other than [facility] providers in the past year (date may be  approximate).     Assessment:   This is a routine wellness examination for Tina Dalton.  Hearing/Vision screen Hearing Screening - Comments:: Pt denies any hearing issues  Vision Screening - Comments:: Pt follows up with Dr Genia Del for annul eye exams   Dietary issues and exercise activities discussed: Current Exercise Habits: Home exercise routine, Type of exercise: walking, Time (Minutes): 30, Frequency (Times/Week): 7, Weekly Exercise (Minutes/Week): 210   Goals Addressed             This Visit's Progress    Patient Stated       Stay active        Depression Screen    07/16/2022  8:36 AM 05/08/2022    9:49 AM 04/03/2021    1:45 PM 03/31/2021   10:00 AM 03/19/2021   11:40 AM 04/01/2020    1:14 PM 05/24/2019    1:37 PM  PHQ 2/9 Scores  PHQ - 2 Score 0 0 0 0 0 0 0  PHQ- 9 Score       0    Fall Risk    07/16/2022    8:38 AM 07/14/2022   11:38 AM 05/08/2022    9:49 AM 04/03/2021    1:47 PM 03/31/2021   10:00 AM  Fall Risk   Falls in the past year? 0 0 0 1 0  Number falls in past yr: 0 0 0 1 0  Injury with Fall? 0 0 0 1 0  Comment    bruises from fall on a street   Risk for fall due to : No Fall Risks;Impaired vision  No Fall Risks Impaired vision   Follow up Falls prevention discussed  Falls evaluation completed Falls prevention discussed     FALL RISK PREVENTION PERTAINING TO THE HOME:  Any stairs in or around the home? Yes  If so, are there any without handrails? No  Home free of loose throw rugs in walkways, pet beds, electrical cords, etc? Yes  Adequate lighting in your home to reduce risk of falls? Yes   ASSISTIVE DEVICES UTILIZED TO PREVENT FALLS:  Life alert? No  Use of a cane, walker or w/c? No  Grab bars in the bathroom? No  Shower chair or bench in shower? No  Elevated toilet seat or a handicapped toilet? No   TIMED UP AND GO:  Was the test performed? No .   Cognitive Function:        07/16/2022    8:38 AM 04/03/2021    1:52 PM 04/01/2020     1:27 PM  6CIT Screen  What Year? 0 points 0 points 0 points  What month? 0 points 0 points 0 points  What time? 0 points 0 points   Count back from 20 0 points 0 points 0 points  Months in reverse 0 points 0 points 0 points  Repeat phrase 0 points 0 points 0 points  Total Score 0 points 0 points     Immunizations Immunization History  Administered Date(s) Administered   Fluad Quad(high Dose 65+) 05/24/2019, 08/06/2021   Influenza, High Dose Seasonal PF 06/17/2018   Influenza,inj,Quad PF,6+ Mos 10/06/2017   Influenza-Unspecified 06/17/2022   PFIZER(Purple Top)SARS-COV-2 Vaccination 10/11/2019, 11/01/2019, 06/22/2020, 12/27/2020   Pneumococcal Conjugate-13 01/02/2020   Pneumococcal Polysaccharide-23 04/23/2021   Respiratory Syncytial Virus Vaccine,Recomb Aduvanted(Arexvy) 06/17/2022   Tdap 11/17/2017, 12/01/2020   Zoster Recombinat (Shingrix) 05/30/2021, 07/25/2021    TDAP status: Up to date  Flu Vaccine status: Up to date  Pneumococcal vaccine status: Up to date  Covid-19 vaccine status: Completed vaccines  Qualifies for Shingles Vaccine? Yes   Zostavax completed Yes   Shingrix Completed?: Yes  Screening Tests Health Maintenance  Topic Date Due   COVID-19 Vaccine (5 - Pfizer series) 02/21/2021   DEXA SCAN  03/15/2022   MAMMOGRAM  04/18/2023   Medicare Annual Wellness (AWV)  08/16/2023   Fecal DNA (Cologuard)  04/21/2024   TETANUS/TDAP  12/02/2030   Pneumonia Vaccine 40+ Years old  Completed   INFLUENZA VACCINE  Completed   Hepatitis C Screening  Completed   Zoster Vaccines- Shingrix  Completed   HPV VACCINES  Aged Out    Health Maintenance  Health Maintenance  Due  Topic Date Due   COVID-19 Vaccine (5 - Pfizer series) 02/21/2021   DEXA SCAN  03/15/2022    Colorectal cancer screening: Type of screening: Cologuard. Completed 04/21/21. Repeat every 3 years  Mammogram status: Completed 04/17/22. Repeat every year  Bone Density status: Completed 03/15/20.  Results reflect: Bone density results: OSTEOPENIA. Repeat every 2 years.  Additional Screening:  Hepatitis C Screening:  Completed 06/17/18  Vision Screening: Recommended annual ophthalmology exams for early detection of glaucoma and other disorders of the eye. Is the patient up to date with their annual eye exam?  Yes  Who is the provider or what is the name of the office in which the patient attends annual eye exams? Dr Alanda Slim  If pt is not established with a provider, would they like to be referred to a provider to establish care? No .   Dental Screening: Recommended annual dental exams for proper oral hygiene  Community Resource Referral / Chronic Care Management: CRR required this visit?  No   CCM required this visit?  No      Plan:     I have personally reviewed and noted the following in the patient's chart:   Medical and social history Use of alcohol, tobacco or illicit drugs  Current medications and supplements including opioid prescriptions. Patient is not currently taking opioid prescriptions. Functional ability and status Nutritional status Physical activity Advanced directives List of other physicians Hospitalizations, surgeries, and ER visits in previous 12 months Vitals Screenings to include cognitive, depression, and falls Referrals and appointments  In addition, I have reviewed and discussed with patient certain preventive protocols, quality metrics, and best practice recommendations. A written personalized care plan for preventive services as well as general preventive health recommendations were provided to patient.     Willette Brace, LPN   [id number]   Nurse Notes: none

## 2022-07-16 NOTE — Telephone Encounter (Signed)
Patient would like a call back to discuss results of recent tests.

## 2022-07-16 NOTE — Telephone Encounter (Signed)
Lelon Perla, MD  07/15/2022  3:47 PM EDT     Normal LV function, mild A I BC     Lelon Perla, MD  07/03/2022  4:03 PM EDT     Results as outlined; if she is significantly symptomatic and wants, can add toprol 25 mg q HS to improve symptoms; await echo Kirk Ruths   Echo and monitor results discussed as ordered.  She would like to try metoprolol succinate 25mg  QHS  Rx(s) sent to pharmacy electronically.

## 2022-07-21 ENCOUNTER — Other Ambulatory Visit: Payer: Self-pay | Admitting: *Deleted

## 2022-07-21 DIAGNOSIS — E7800 Pure hypercholesterolemia, unspecified: Secondary | ICD-10-CM

## 2022-07-21 MED ORDER — ROSUVASTATIN CALCIUM 40 MG PO TABS: 40.0000 mg | ORAL_TABLET | Freq: Every day | ORAL | 3 refills | Status: DC

## 2022-09-23 LAB — HEPATIC FUNCTION PANEL
ALT: 15 IU/L (ref 0–32)
AST: 23 IU/L (ref 0–40)
Albumin: 4.3 g/dL (ref 3.9–4.9)
Alkaline Phosphatase: 77 IU/L (ref 44–121)
Bilirubin Total: 0.8 mg/dL (ref 0.0–1.2)
Bilirubin, Direct: 0.22 mg/dL (ref 0.00–0.40)
Total Protein: 7 g/dL (ref 6.0–8.5)

## 2022-09-23 LAB — LIPID PANEL
Chol/HDL Ratio: 2.4 ratio (ref 0.0–4.4)
Cholesterol, Total: 181 mg/dL (ref 100–199)
HDL: 76 mg/dL (ref >39)
LDL Chol Calc (NIH): 89 mg/dL (ref 0–99)
Triglycerides: 88 mg/dL (ref 0–149)
VLDL Cholesterol Cal: 16 mg/dL (ref 5–40)

## 2022-09-24 ENCOUNTER — Telehealth: Payer: Self-pay | Admitting: *Deleted

## 2022-09-24 DIAGNOSIS — E7800 Pure hypercholesterolemia, unspecified: Secondary | ICD-10-CM

## 2022-09-24 MED ORDER — EZETIMIBE 10 MG PO TABS: 10.0000 mg | ORAL_TABLET | Freq: Every day | ORAL | 3 refills | Status: DC

## 2022-09-24 NOTE — Telephone Encounter (Signed)
-----   Message from Lelon Perla, MD sent at 09/23/2022  5:35 PM EST ----- Add zetia 10 mg daily; lipids and liver 8 weeks Kirk Ruths'

## 2022-09-24 NOTE — Telephone Encounter (Signed)
Pt has reviewed results via my chart  New script sent to the pharmacy  Lab orders mailed to the pt  

## 2022-10-28 ENCOUNTER — Other Ambulatory Visit: Payer: Medicare Other

## 2022-11-27 LAB — LIPID PANEL
Chol/HDL Ratio: 2.4 ratio (ref 0.0–4.4)
Cholesterol, Total: 167 mg/dL (ref 100–199)
HDL: 70 mg/dL (ref >39)
LDL Chol Calc (NIH): 77 mg/dL (ref 0–99)
Triglycerides: 115 mg/dL (ref 0–149)
VLDL Cholesterol Cal: 20 mg/dL (ref 5–40)

## 2022-11-27 LAB — HEPATIC FUNCTION PANEL
ALT: 29 IU/L (ref 0–32)
AST: 34 IU/L (ref 0–40)
Albumin: 4.4 g/dL (ref 3.9–4.9)
Alkaline Phosphatase: 71 IU/L (ref 44–121)
Bilirubin Total: 0.5 mg/dL (ref 0.0–1.2)
Bilirubin, Direct: 0.16 mg/dL (ref 0.00–0.40)
Total Protein: 6.7 g/dL (ref 6.0–8.5)

## 2022-12-01 NOTE — Progress Notes (Signed)
HPI: Follow-up palpitations.  Monitor October 2023 showed sinus rhythm with short runs of SVT, PVCs, ventricular triplet and 8 beats of AIVR.  Calcium score October 2023 34.9 which was 61st percentile.  Echocardiogram October 2023 showed normal LV function, grade 1 diastolic dysfunction, mild aortic insufficiency.  Since last seen she denies dyspnea, chest pain or syncope.  She has occasional brief flutters but her palpitations are improved compared to previous office visit.  Current Outpatient Medications  Medication Sig Dispense Refill   ezetimibe (ZETIA) 10 MG tablet Take 1 tablet (10 mg total) by mouth daily. 90 tablet 3   levothyroxine (SYNTHROID) 112 MCG tablet TAKE 1 TABLET BY MOUTH EVERY DAY BEFORE BREAKFAST 90 tablet 3   metoprolol succinate (TOPROL-XL) 25 MG 24 hr tablet Take 1 tablet (25 mg total) by mouth at bedtime. 90 tablet 3   rosuvastatin (CRESTOR) 40 MG tablet Take 1 tablet (40 mg total) by mouth daily. 90 tablet 3   No current facility-administered medications for this visit.     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/2020   Hypothyroidism    Mixed hyperlipidemia 04/01/2021   ASCVD 8.3% 03/2021; offered statin   Osteoarthritis of knee    Osteopenia after menopause 04/01/2020   DEXA July 2020: Lowest T score equals -1.9    Past Surgical History:  Procedure Laterality Date   CATARACT EXTRACTION W/ INTRAOCULAR LENS IMPLANT  2022   S/P Knee replacement   03/2015    Social History   Socioeconomic History   Marital status: Divorced    Spouse name: Not on file   Number of children: 1   Years of education: Not on file   Highest education level: Not on file  Occupational History   Not on file  Tobacco Use   Smoking status: Never   Smokeless tobacco: Never  Vaping Use   Vaping Use: Never used  Substance and Sexual Activity   Alcohol use: Yes    Comment: Rare   Drug use: Never    Sexual activity: Yes  Other Topics Concern   Not on file  Social History Narrative   1 daughter.1 grand dau   Retired Optician, dispensing   Social Determinants of Radio broadcast assistant Strain: [place]  (07/16/2022)   Overall Financial Resource Strain (CARDIA)    Difficulty of Paying Living Expenses: Not hard at all  Food Insecurity: No Food Insecurity (07/16/2022)   Hunger Vital Sign    Worried About Running Out of Food in the Last Year: Never true    Ran Out of Food in the Last Year: Never true  Transportation Needs: No Transportation Needs (07/16/2022)   PRAPARE - Hydrologist (Medical): No    Lack of Transportation (Non-Medical): No  Physical Activity: Sufficiently Active (07/16/2022)   Exercise Vital Sign    Days of Exercise per Week: 7 days    Minutes of Exercise per Session: 30 min  Stress: No Stress Concern Present (07/16/2022)   [place]    Feeling of Stress : Not at all  Social Connections: Socially Isolated (07/16/2022)   Social Connection and Isolation Panel [NHANES]    Frequency of Communication with Friends and Family: Three times a week    Frequency of Social Gatherings with Friends and Family: More than three times a week    Attends  Religious Services: Never    Active Member of Clubs or Organizations: No    Attends Archivist Meetings: Never    Marital Status: Divorced  Human resources officer Violence: Not At Risk (07/16/2022)   Humiliation, Afraid, Rape, and Kick questionnaire    Fear of Current or Ex-Partner: No    Emotionally Abused: No    Physically Abused: No    Sexually Abused: No    Family History  Problem Relation Age of Onset   Alzheimer's disease Mother    Heart attack Father    Hypertension Father    Cancer Father        Skin   Coronary artery disease Father     ROS: no fevers or chills, productive cough, hemoptysis, dysphasia,  odynophagia, melena, hematochezia, dysuria, hematuria, rash, seizure activity, orthopnea, PND, pedal edema, claudication. Remaining systems are negative.  Physical Exam: Well-developed well-nourished in no acute distress.  Skin is warm and dry.  HEENT is normal.  Neck is supple.  Chest is clear to auscultation with normal expansion.  Cardiovascular exam is regular rate and rhythm.  Abdominal exam nontender or distended. No masses palpated. Extremities show no edema. neuro grossly intact  ECG-normal sinus rhythm at a rate of 91, no significant ST changes.  Personally reviewed  A/P  1 palpitations-symptoms are reasonly well-controlled.  We will consider increasing Toprol in the future if necessary.  2 hyperlipidemia-given mildly elevated calcium score we will continue statin and zetia.  Most recent LDL 77.  Can consider PCSK9 inhibitor in the future if LDL remains minimally elevated.  3 coronary calcification-noted on previous CT.  Patient is not having chest pain.  Continue statin.  Kirk Ruths, MD

## 2022-12-08 ENCOUNTER — Encounter: Payer: Self-pay | Admitting: Cardiology

## 2022-12-08 ENCOUNTER — Ambulatory Visit: Payer: Medicare Other | Attending: Cardiology | Admitting: Cardiology

## 2022-12-08 VITALS — BP 120/78 | HR 91 | Ht 70.0 in | Wt 168.0 lb

## 2022-12-08 DIAGNOSIS — I2510 Atherosclerotic heart disease of native coronary artery without angina pectoris: Secondary | ICD-10-CM

## 2022-12-08 DIAGNOSIS — R002 Palpitations: Secondary | ICD-10-CM | POA: Diagnosis not present

## 2022-12-08 DIAGNOSIS — E7800 Pure hypercholesterolemia, unspecified: Secondary | ICD-10-CM | POA: Diagnosis not present

## 2022-12-08 DIAGNOSIS — I2584 Coronary atherosclerosis due to calcified coronary lesion: Secondary | ICD-10-CM

## 2022-12-08 NOTE — Patient Instructions (Signed)
    Follow-Up: At [facility], you and your health needs are our priority.  As part of our continuing mission to provide you with exceptional heart care, we have created designated Provider Care Teams.  These Care Teams include your primary Cardiologist (physician) and Advanced Practice Providers (APPs -  Physician Assistants and Nurse Practitioners) who all work together to provide you with the care you need, when you need it.  We recommend signing up for the patient portal called "MyChart".  Sign up information is provided on this After Visit Summary.  MyChart is used to connect with patients for Virtual Visits (Telemedicine).  Patients are able to view lab/test results, encounter notes, upcoming appointments, etc.  Non-urgent messages can be sent to your provider as well.   To learn more about what you can do with MyChart, go to [email or web address].    Your next appointment:   12 month(s)  Provider:   Brian Crenshaw, MD     

## 2022-12-15 ENCOUNTER — Other Ambulatory Visit: Payer: Self-pay | Admitting: Family Medicine

## 2023-03-30 ENCOUNTER — Other Ambulatory Visit: Payer: Self-pay

## 2023-03-30 ENCOUNTER — Telehealth: Payer: Self-pay | Admitting: Family Medicine

## 2023-03-30 MED ORDER — LEVOTHYROXINE SODIUM 112 MCG PO TABS: ORAL_TABLET | ORAL | 3 refills | Status: DC

## 2023-03-30 NOTE — Telephone Encounter (Signed)
Rx sent 

## 2023-03-30 NOTE — Telephone Encounter (Signed)
Prescription Request  03/30/2023  LOV: 05/08/2022 Next office visit: 7/12-- only has 3 pills left   What is the name of the medication or equipment? Levothyroxine 112 mcg  Have you contacted your pharmacy to request a refill? Yes   Which pharmacy would you like this sent to?  [id number] [place] Otto, [place] - [address] AT [facility] [address] [facility] [address] [place] [address] Phone: [phone] Fax: [phone]    Patient notified that their request is being sent to the clinical staff for review and that they should receive a response within 2 business days.   Please advise at Mobile [phone] (mobile)

## 2023-04-02 ENCOUNTER — Ambulatory Visit (INDEPENDENT_AMBULATORY_CARE_PROVIDER_SITE_OTHER): Payer: Medicare Other | Admitting: Family Medicine

## 2023-04-02 ENCOUNTER — Encounter: Payer: Self-pay | Admitting: Family Medicine

## 2023-04-02 VITALS — BP 100/58 | HR 84 | Temp 97.9°F | Ht 70.0 in | Wt 162.8 lb

## 2023-04-02 DIAGNOSIS — Z780 Asymptomatic menopausal state: Secondary | ICD-10-CM

## 2023-04-02 DIAGNOSIS — Z0000 Encounter for general adult medical examination without abnormal findings: Secondary | ICD-10-CM

## 2023-04-02 DIAGNOSIS — E039 Hypothyroidism, unspecified: Secondary | ICD-10-CM

## 2023-04-02 DIAGNOSIS — I2510 Atherosclerotic heart disease of native coronary artery without angina pectoris: Secondary | ICD-10-CM

## 2023-04-02 DIAGNOSIS — F430 Acute stress reaction: Secondary | ICD-10-CM | POA: Diagnosis not present

## 2023-04-02 DIAGNOSIS — Z0001 Encounter for general adult medical examination with abnormal findings: Secondary | ICD-10-CM | POA: Diagnosis not present

## 2023-04-02 DIAGNOSIS — R030 Elevated blood-pressure reading, without diagnosis of hypertension: Secondary | ICD-10-CM

## 2023-04-02 DIAGNOSIS — E782 Mixed hyperlipidemia: Secondary | ICD-10-CM

## 2023-04-02 DIAGNOSIS — R002 Palpitations: Secondary | ICD-10-CM | POA: Diagnosis not present

## 2023-04-02 DIAGNOSIS — Z1231 Encounter for screening mammogram for malignant neoplasm of breast: Secondary | ICD-10-CM

## 2023-04-02 DIAGNOSIS — E559 Vitamin D deficiency, unspecified: Secondary | ICD-10-CM

## 2023-04-02 LAB — COMPREHENSIVE METABOLIC PANEL
ALT: 17 U/L (ref 0–35)
AST: 20 U/L (ref 0–37)
Albumin: 4.1 g/dL (ref 3.5–5.2)
Alkaline Phosphatase: 52 U/L (ref 39–117)
BUN: 12 mg/dL (ref 6–23)
CO2: 31 mEq/L (ref 19–32)
Calcium: 9.9 mg/dL (ref 8.4–10.5)
Chloride: 106 mEq/L (ref 96–112)
Creatinine, Ser: 0.9 mg/dL (ref 0.40–1.20)
GFR: 65.08 mL/min (ref >60.00)
Glucose, Bld: 108 mg/dL — ABNORMAL HIGH (ref 70–99)
Potassium: 4.2 mEq/L (ref 3.5–5.1)
Sodium: 142 mEq/L (ref 135–145)
Total Bilirubin: 0.8 mg/dL (ref 0.2–1.2)
Total Protein: 6.5 g/dL (ref 6.0–8.3)

## 2023-04-02 LAB — CBC WITH DIFFERENTIAL/PLATELET
Basophils Absolute: 0 10*3/uL (ref 0.0–0.1)
Basophils Relative: 0.6 % (ref 0.0–3.0)
Eosinophils Absolute: 0.1 10*3/uL (ref 0.0–0.7)
Eosinophils Relative: 1.2 % (ref 0.0–5.0)
HCT: 41.3 % (ref 36.0–46.0)
Hemoglobin: 13.6 g/dL (ref 12.0–15.0)
Lymphocytes Relative: 18.5 % (ref 12.0–46.0)
Lymphs Abs: 1.4 10*3/uL (ref 0.7–4.0)
MCHC: 32.9 g/dL (ref 30.0–36.0)
MCV: 87.4 fl (ref 78.0–100.0)
Monocytes Absolute: 0.5 10*3/uL (ref 0.1–1.0)
Monocytes Relative: 7.2 % (ref 3.0–12.0)
Neutro Abs: 5.5 10*3/uL (ref 1.4–7.7)
Neutrophils Relative %: 72.5 % (ref 43.0–77.0)
Platelets: 183 10*3/uL (ref 150.0–400.0)
RBC: 4.73 Mil/uL (ref 3.87–5.11)
RDW: 13.2 % (ref 11.5–15.5)
WBC: 7.6 10*3/uL (ref 4.0–10.5)

## 2023-04-02 LAB — VITAMIN D 25 HYDROXY (VIT D DEFICIENCY, FRACTURES): VITD: 26.47 ng/mL — ABNORMAL LOW (ref 30.00–100.00)

## 2023-04-02 LAB — LIPID PANEL
Cholesterol: 139 mg/dL (ref 0–200)
HDL: 58.4 mg/dL (ref >39.00)
LDL Cholesterol: 66 mg/dL (ref 0–99)
NonHDL: 80.84
Total CHOL/HDL Ratio: 2
Triglycerides: 73 mg/dL (ref 0.0–149.0)
VLDL: 14.6 mg/dL (ref 0.0–40.0)

## 2023-04-02 LAB — TSH: TSH: 0.08 u[IU]/mL — ABNORMAL LOW (ref 0.35–5.50)

## 2023-04-02 NOTE — Progress Notes (Signed)
Subjective  Chief Complaint  Patient presents with   Hypothyroidism   Annual Exam    Pt here for Annual exam and is not currently fasting     HPI: Tina Dalton is a 72 y.o. female who presents to [facility] [place] today for a Female Wellness Visit. She also has the concerns and/or needs as listed above in the chief complaint. These will be addressed in addition to the Health Maintenance Visit.   Wellness Visit: annual visit with health maintenance review and exam without Pap  Health maintenance: Mammogram due this month, overdue for bone density to follow-up on osteopenia.  Cologuard due again next year.  Immunizations are current.  Unfortunately, struggling with some stress.  Her sister suffered a fall and fractured several vertebral spine.  Patient has been her primary caregiver for the last couple months.  Her sister is now living with her.  She is not sleeping well or eating well.  Weight is down.  Overall coping but is wanting to get her sister back home if possible. Chronic disease f/u and/or acute problem visit: (deemed necessary to be done in addition to the wellness visit): Hypothyroidism: On levothyroxine 112 mcg daily.  Her weight is down.  No palpitations, tremor but is having some anxiety more related to life situation.  Due for recheck compliant with medications Hyperlipidemia: Takes Zetia and statin.  Compliant.  LDL goal less than 70.  Reviewed cardiology notes.  Had coronary calcium score in the 61 percentile. History of prehypertension but blood pressure running low today.  She is on beta-blocker low-dose for palpitations, SVT.  Reviewed monitor results. History of vitamin D deficiency and osteopenia: Overdue for bone density recheck.  No falls.  Assessment  1. Annual physical exam   2. Acquired hypothyroidism   3. Mixed hyperlipidemia   4. Prehypertension   5. Vitamin D deficiency   6. Osteopenia after menopause   7. Asymptomatic menopausal state    8. Palpitations   9. Coronary artery calcification   10. Screening mammogram for breast cancer   11. Stress reaction      Plan  Female Wellness Visit: Age appropriate Health Maintenance and Prevention measures were discussed with patient. Included topics are cancer screening recommendations, ways to keep healthy (see AVS) including dietary and exercise recommendations, regular eye and dental care, use of seat belts, and avoidance of moderate alcohol use and tobacco use.  BMI: discussed patient's BMI and encouraged positive lifestyle modifications to help get to or maintain a target BMI. HM needs and immunizations were addressed and ordered. See below for orders. See HM and immunization section for updates. Routine labs and screening tests ordered including cmp, cbc and lipids where appropriate. Discussed recommendations regarding Vit D and calcium supplementation (see AVS)  Chronic disease management visit and/or acute problem visit: Stress reaction: Counseling done.  Patient will address goals of sister's care with sisters grown children who live in [place].  Recommend follow-up with sisters primary care doctor for further cognitive health screens.  Reassured patient.  Recommend self-care.  Follow-up if needed Hypothyroidism: Has been well-controlled the patient's weight is down.  Recheck levels today.  No signs of hyperthyroidism.  Levothyroxine 112 mcg daily Hyperlipidemia: Zetia 10 daily and rosuvastatin 40 mg nightly.  Recheck nonfasting lipids today with LFTs.  Goal LDL less than 70 Palpitations from SVT are well-controlled on Toprol-XL 25 daily.  Monitor blood pressures Patient to schedule mammogram and bone density  Follow up: 12 months for  complete physical, sooner if needed Orders Placed This Encounter  Procedures   DG Bone Density   MM DIGITAL SCREENING BILATERAL   VITAMIN D 25 Hydroxy (Vit-D Deficiency, Fractures)   CBC with Differential/Platelet   Comprehensive metabolic  panel   Lipid panel   TSH   No orders of the defined types were placed in this encounter.     Body mass index is 23.36 kg/m. Wt Readings from Last 3 Encounters:  04/02/23 162 lb 12.8 oz (73.8 kg)  12/08/22 168 lb (76.2 kg)  07/16/22 189 lb (85.7 kg)     Patient Active Problem List   Diagnosis Date Noted Date Diagnosed   Palpitations 04/02/2023     Priority: High    Dr. Jens Som: Monitor October 2023 showed sinus rhythm with short runs of SVT, PVCs, ventricular triplet and 8 beats of AIVR.  Calcium score October 2023 34.9 which was 61st percentile.  Echocardiogram October 2023 showed normal LV function, grade 1 diastolic dysfunction, mild aortic insufficiency.  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 01/02/2020     Priority: Low   History of bilateral knee replacement 01/02/2020     Priority: Low   Vitamin D deficiency 05/29/2019     Priority: Low   Health Maintenance  Topic Date Due   DEXA SCAN  03/15/2022   COVID-19 Vaccine (5 - 2023-24 season) 04/18/2023 (Originally 05/22/2022)   MAMMOGRAM  04/18/2023   INFLUENZA VACCINE  04/22/2023   Medicare Annual Wellness (AWV)  07/17/2023   Fecal DNA (Cologuard)  04/21/2024   DTaP/Tdap/Td (3 - Td or Tdap) 12/02/2030   Pneumonia Vaccine 30+ Years old  Completed   Hepatitis C Screening  Completed   Zoster Vaccines- Shingrix  Completed   HPV VACCINES  Aged Out   Immunization History   Administered Date(s) Administered   Fluad Quad(high Dose 65+) 05/24/2019, 08/06/2021   Influenza, High Dose Seasonal PF 06/17/2018   Influenza,inj,Quad PF,6+ Mos 10/06/2017   Influenza-Unspecified 06/17/2022   PFIZER(Purple Top)SARS-COV-2 Vaccination 10/11/2019, 11/01/2019, 06/22/2020, 12/27/2020   Pneumococcal Conjugate-13 01/02/2020   Pneumococcal Polysaccharide-23 04/23/2021   Respiratory Syncytial Virus Vaccine,Recomb Aduvanted(Arexvy) 06/17/2022   Tdap 11/17/2017, 12/01/2020   Zoster Recombinant(Shingrix) 05/30/2021, 07/25/2021   We updated and reviewed the patient's past history in detail and it is documented below. Allergies: Patient has No Known Allergies. Past Medical History Patient  has a past medical history of Heart murmur, systolic (01/02/2020), History of bilateral knee replacement (01/02/2020), Hypothyroidism, Mixed hyperlipidemia (04/01/2021), Osteoarthritis of knee, and Osteopenia after menopause (04/01/2020). Past Surgical History Patient  has a past surgical history that includes S/P Knee replacement  (03/2015) and Cataract extraction w/ intraocular lens implant (2022). Family History: Patient family history includes Alzheimer's disease in her mother; Cancer in her father; Coronary artery disease in her father; Heart attack in her father; Hypertension in her  father. Social History:  Patient  reports that she has never smoked. She has never used smokeless tobacco. She reports current alcohol use. She reports that she does not use drugs.  Review of Systems: Constitutional: negative for fever or malaise Ophthalmic: negative for photophobia, double vision or loss of vision Cardiovascular: negative for chest pain, dyspnea on exertion, or new LE swelling Respiratory: negative for SOB or persistent cough Gastrointestinal: negative for abdominal pain, change in bowel habits or melena Genitourinary: negative for dysuria or gross hematuria, no abnormal uterine bleeding or  disharge Musculoskeletal: negative for new gait disturbance or muscular weakness Integumentary: negative for new or persistent rashes, no breast lumps Neurological: negative for TIA or stroke symptoms Psychiatric: negative for SI or delusions Allergic/Immunologic: negative for hives  Patient Care Team    Relationship Specialty Notifications Start End  Willow Ora, MD PCP - General Family Medicine  01/02/20   Lewayne Bunting, MD PCP - Cardiology Cardiology  06/09/22     Objective  Vitals: BP (!) 100/58   Pulse 84   Temp 97.9 F (36.6 C)   Ht 5\' 10"  (1.778 m)   Wt 162 lb 12.8 oz (73.8 kg)   LMP  (LMP Unknown)   SpO2 97%   BMI 23.36 kg/m  General:  Well developed, well nourished, no acute distress  Psych:  Alert and orientedx3,normal mood and affect, stressed during our conversation but good insight HEENT:  Normocephalic, atraumatic, non-icteric sclera,  supple neck without adenopathy, mass or thyromegaly Cardiovascular:  Normal S1, S2, RRR without gallop, rub or murmur Respiratory:  Good breath sounds bilaterally, CTAB with normal respiratory effort Gastrointestinal: normal bowel sounds, soft, non-tender, no noted masses. No HSM MSK: extremities without edema, joints without erythema or swelling Neurologic:    Mental status is normal.  Gross motor and sensory exams are normal.  No tremor  Commons side effects, risks, benefits, and alternatives for medications and treatment plan prescribed today were discussed, and the patient expressed understanding of the given instructions. Patient is instructed to call or message via MyChart if he/she has any questions or concerns regarding our treatment plan. No barriers to understanding were identified. We discussed Red Flag symptoms and signs in detail. Patient expressed understanding regarding what to do in case of urgent or emergency type symptoms.  Medication list was reconciled, printed and provided to the patient in AVS. Patient  instructions and summary information was reviewed with the patient as documented in the AVS. This note was prepared with assistance of Dragon voice recognition software. Occasional wrong-word or sound-a-like substitutions may have occurred due to the inherent limitations of voice recognition software

## 2023-04-02 NOTE — Patient Instructions (Signed)
Please return in 12 months for your annual complete physical; please come fasting.   I will release your lab results to you on your MyChart account with further instructions. You may see the results before I do, but when I review them I will send you a message with my report or have my assistant call you if things need to be discussed. Please reply to my message with any questions. Thank you!   If you have any questions or concerns, please don't hesitate to send me a message via MyChart or call the office at [phone]. Thank you for visiting with [place] today! It's our pleasure caring for you.   Please call the office checked below to schedule your appointment for your mammogram and/or bone density screen (the checked studies were ordered): [x]   Mammogram  [x]   Bone Density  [x]   [facility]     [address] [place], [place]        [phone]         []   [facility]  [address] [place], [place]  [phone]

## 2023-04-06 MED ORDER — LEVOTHYROXINE SODIUM 100 MCG PO TABS: 100.0000 ug | ORAL_TABLET | Freq: Every day | ORAL | 3 refills | Status: DC

## 2023-04-06 NOTE — Progress Notes (Signed)
Please call patient: Lab work results show vitamin D remains a little low.Recommend restarting vitamin D 2000 units daily.  Also, thyroid test shows she is getting too much hormone.  Need to decrease dose down from 112 daily to 100 mcg daily.  I have ordered this new dose for her.  I recommend repeat TSH in 6 weeks to ensure levels are improved.  Please schedule lab visit.  I have ordered the TSH.  Cholesterol and blood counts look great

## 2023-04-06 NOTE — Addendum Note (Signed)
Addended by: Asencion Partridge on: 04/06/2023 02:52 PM   Modules accepted: Orders

## 2023-04-14 ENCOUNTER — Encounter (HOSPITAL_COMMUNITY): Payer: Self-pay | Admitting: Emergency Medicine

## 2023-04-14 ENCOUNTER — Observation Stay (HOSPITAL_BASED_OUTPATIENT_CLINIC_OR_DEPARTMENT_OTHER): Payer: Medicare Other

## 2023-04-14 ENCOUNTER — Other Ambulatory Visit: Payer: Self-pay

## 2023-04-14 ENCOUNTER — Emergency Department (HOSPITAL_COMMUNITY): Payer: Medicare Other

## 2023-04-14 ENCOUNTER — Observation Stay (HOSPITAL_COMMUNITY)
Admission: EM | Admit: 2023-04-14 | Discharge: 2023-04-15 | Disposition: A | Discharge status: Home / Self Care | Payer: Medicare Other | Attending: Internal Medicine | Admitting: Internal Medicine

## 2023-04-14 DIAGNOSIS — R002 Palpitations: Secondary | ICD-10-CM | POA: Diagnosis not present

## 2023-04-14 DIAGNOSIS — I4891 Unspecified atrial fibrillation: Secondary | ICD-10-CM | POA: Diagnosis not present

## 2023-04-14 DIAGNOSIS — Z79899 Other long term (current) drug therapy: Secondary | ICD-10-CM | POA: Diagnosis not present

## 2023-04-14 DIAGNOSIS — E039 Hypothyroidism, unspecified: Secondary | ICD-10-CM | POA: Diagnosis not present

## 2023-04-14 DIAGNOSIS — Z96653 Presence of artificial knee joint, bilateral: Secondary | ICD-10-CM | POA: Diagnosis not present

## 2023-04-14 DIAGNOSIS — I480 Paroxysmal atrial fibrillation: Secondary | ICD-10-CM

## 2023-04-14 DIAGNOSIS — I2510 Atherosclerotic heart disease of native coronary artery without angina pectoris: Secondary | ICD-10-CM | POA: Diagnosis present

## 2023-04-14 DIAGNOSIS — R55 Syncope and collapse: Secondary | ICD-10-CM

## 2023-04-14 DIAGNOSIS — E782 Mixed hyperlipidemia: Secondary | ICD-10-CM | POA: Diagnosis present

## 2023-04-14 DIAGNOSIS — I5189 Other ill-defined heart diseases: Secondary | ICD-10-CM

## 2023-04-14 DIAGNOSIS — R8271 Bacteriuria: Secondary | ICD-10-CM | POA: Diagnosis present

## 2023-04-14 HISTORY — DX: Other ill-defined heart diseases: I51.89

## 2023-04-14 HISTORY — DX: Unspecified atrial fibrillation: I48.91

## 2023-04-14 LAB — COMPREHENSIVE METABOLIC PANEL
ALT: 26 U/L (ref 0–44)
AST: 29 U/L (ref 15–41)
Albumin: 3.8 g/dL (ref 3.5–5.0)
Alkaline Phosphatase: 49 U/L (ref 38–126)
Anion gap: 11 (ref 5–15)
BUN: 12 mg/dL (ref 8–23)
CO2: 22 mmol/L (ref 22–32)
Calcium: 9.3 mg/dL (ref 8.9–10.3)
Chloride: 107 mmol/L (ref 98–111)
Creatinine, Ser: 0.92 mg/dL (ref 0.44–1.00)
GFR, Estimated: >60 mL/min (ref >60)
Glucose, Bld: 172 mg/dL — ABNORMAL HIGH (ref 70–99)
Potassium: 3.5 mmol/L (ref 3.5–5.1)
Sodium: 140 mmol/L (ref 135–145)
Total Bilirubin: 0.7 mg/dL (ref 0.3–1.2)
Total Protein: 7 g/dL (ref 6.5–8.1)

## 2023-04-14 LAB — URINALYSIS, ROUTINE W REFLEX MICROSCOPIC
Bilirubin Urine: NEGATIVE
Glucose, UA: NEGATIVE mg/dL
Ketones, ur: NEGATIVE mg/dL
Nitrite: NEGATIVE
Protein, ur: 30 mg/dL — AB
Specific Gravity, Urine: 1.01 (ref 1.005–1.030)
WBC, UA: >50 WBC/hpf (ref 0–5)
pH: 7 (ref 5.0–8.0)

## 2023-04-14 LAB — TROPONIN I (HIGH SENSITIVITY)
Troponin I (High Sensitivity): 8 ng/L (ref <18)
Troponin I (High Sensitivity): 8 ng/L (ref <18)

## 2023-04-14 LAB — CBC WITH DIFFERENTIAL/PLATELET
Abs Immature Granulocytes: 0.02 10*3/uL (ref 0.00–0.07)
Basophils Absolute: 0.1 10*3/uL (ref 0.0–0.1)
Basophils Relative: 1 %
Eosinophils Absolute: 0 10*3/uL (ref 0.0–0.5)
Eosinophils Relative: 0 %
HCT: 44.7 % (ref 36.0–46.0)
Hemoglobin: 14.3 g/dL (ref 12.0–15.0)
Immature Granulocytes: 0 %
Lymphocytes Relative: 12 %
Lymphs Abs: 1.1 10*3/uL (ref 0.7–4.0)
MCH: 28.5 pg (ref 26.0–34.0)
MCHC: 32 g/dL (ref 30.0–36.0)
MCV: 89 fL (ref 80.0–100.0)
Monocytes Absolute: 0.6 10*3/uL (ref 0.1–1.0)
Monocytes Relative: 6 %
Neutro Abs: 7.4 10*3/uL (ref 1.7–7.7)
Neutrophils Relative %: 81 %
Platelets: 190 10*3/uL (ref 150–400)
RBC: 5.02 MIL/uL (ref 3.87–5.11)
RDW: 12.4 % (ref 11.5–15.5)
WBC: 9.1 10*3/uL (ref 4.0–10.5)
nRBC: 0 % (ref 0.0–0.2)

## 2023-04-14 LAB — PHOSPHORUS: Phosphorus: 2.7 mg/dL (ref 2.5–4.6)

## 2023-04-14 LAB — ECHOCARDIOGRAM COMPLETE
Area-P 1/2: 3.46 cm2
Height: 70 in
S' Lateral: 3.3 cm
Weight: 2560 oz

## 2023-04-14 LAB — MAGNESIUM: Magnesium: 2.2 mg/dL (ref 1.7–2.4)

## 2023-04-14 LAB — TSH: TSH: 0.244 u[IU]/mL — ABNORMAL LOW (ref 0.350–4.500)

## 2023-04-14 MED ORDER — SODIUM CHLORIDE 0.9 % IV BOLUS
1000.0000 mL | Freq: Once | INTRAVENOUS | Status: AC
  Administered 2023-04-14: 1000 mL via INTRAVENOUS

## 2023-04-14 MED ORDER — SODIUM CHLORIDE 0.9 % IV BOLUS
500.0000 mL | Freq: Once | INTRAVENOUS | Status: AC
  Administered 2023-04-14: 500 mL via INTRAVENOUS

## 2023-04-14 MED ORDER — EZETIMIBE 10 MG PO TABS
10.0000 mg | ORAL_TABLET | Freq: Every day | ORAL | Status: DC
  Administered 2023-04-14 – 2023-04-15 (×2): 10 mg via ORAL
  Filled 2023-04-14 (×2): qty 1

## 2023-04-14 MED ORDER — POTASSIUM CHLORIDE CRYS ER 20 MEQ PO TBCR
40.0000 meq | EXTENDED_RELEASE_TABLET | Freq: Once | ORAL | Status: AC
  Administered 2023-04-14: 40 meq via ORAL
  Filled 2023-04-14: qty 2

## 2023-04-14 MED ORDER — METOPROLOL SUCCINATE ER 25 MG PO TB24
25.0000 mg | ORAL_TABLET | Freq: Every day | ORAL | Status: DC
  Administered 2023-04-14: 25 mg via ORAL
  Filled 2023-04-14: qty 1

## 2023-04-14 MED ORDER — ONDANSETRON HCL 4 MG/2ML IJ SOLN: 4.0000 mg | Freq: Four times a day (QID) | INTRAMUSCULAR | Status: DC | PRN

## 2023-04-14 MED ORDER — ROSUVASTATIN CALCIUM 20 MG PO TABS
40.0000 mg | ORAL_TABLET | Freq: Every day | ORAL | Status: DC
  Administered 2023-04-14 – 2023-04-15 (×2): 40 mg via ORAL
  Filled 2023-04-14 (×2): qty 2

## 2023-04-14 MED ORDER — DILTIAZEM LOAD VIA INFUSION
10.0000 mg | Freq: Once | INTRAVENOUS | Status: AC
  Administered 2023-04-14: 10 mg via INTRAVENOUS
  Filled 2023-04-14: qty 10

## 2023-04-14 MED ORDER — APIXABAN 5 MG PO TABS
5.0000 mg | ORAL_TABLET | Freq: Two times a day (BID) | ORAL | Status: DC
  Administered 2023-04-14 – 2023-04-15 (×3): 5 mg via ORAL
  Filled 2023-04-14 (×3): qty 1

## 2023-04-14 MED ORDER — ENSURE ENLIVE PO LIQD
237.0000 mL | Freq: Two times a day (BID) | ORAL | Status: DC
  Administered 2023-04-14: 237 mL via ORAL
  Filled 2023-04-14: qty 237

## 2023-04-14 MED ORDER — SODIUM CHLORIDE 0.9% FLUSH: 3.0000 mL | Freq: Two times a day (BID) | INTRAVENOUS | Status: DC

## 2023-04-14 MED ORDER — ACETAMINOPHEN 650 MG RE SUPP: 650.0000 mg | Freq: Four times a day (QID) | RECTAL | Status: DC | PRN

## 2023-04-14 MED ORDER — ACETAMINOPHEN 325 MG PO TABS: 650.0000 mg | ORAL_TABLET | Freq: Four times a day (QID) | ORAL | Status: DC | PRN

## 2023-04-14 MED ORDER — DILTIAZEM HCL-DEXTROSE 125-5 MG/125ML-% IV SOLN (PREMIX)
5.0000 mg/h | INTRAVENOUS | Status: DC
  Administered 2023-04-14 – 2023-04-15 (×2): 5 mg/h via INTRAVENOUS
  Filled 2023-04-14 (×2): qty 125

## 2023-04-14 MED ORDER — ONDANSETRON HCL 4 MG PO TABS: 4.0000 mg | ORAL_TABLET | Freq: Four times a day (QID) | ORAL | Status: DC | PRN

## 2023-04-14 MED ORDER — ORAL CARE MOUTH RINSE: 15.0000 mL | OROMUCOSAL | Status: DC | PRN

## 2023-04-14 NOTE — Consult Note (Addendum)
Cardiology Consultation   Patient ID: Tina Dalton MRN: [id number]; DOB: 05/09/1951  Admit date: 04/14/2023 Date of Consult: 04/14/2023  PCP:  Tina Ora, MD   [facility] Providers Cardiologist:  Tina Millers, MD   Patient Profile:   Tina Dalton is a 72 y.o. female with a hx of palpitations, PSVT/PVCs, HLD, elevated coronary calcium score, and hypothyroidism who is being seen 04/14/2023 for the evaluation of atrial fibrillation with RVR at the request of Tina Dalton.  History of Present Illness:   Tina Dalton established cardiology care with Tina Dalton for palpitations.  Heart monitor 06/2022 showed sinus rhythm with short runs of SVT, PVCs, ventricular triplet, and 8 beats of AIVR.  Tina Dalton had a coronary calcium score 06/2022 that was 34.9 placing her at the 61st percentile.  A follow-up echocardiogram 06/2022 showed a normal LVEF, grade 1 DD, mild AI.  Tina Dalton has been stable on 25 mg Toprol.  Hyperlipidemia has been managed with 40 mg Tina Dalton and 10 mg Zetia.  Tina Dalton has been caring for a family member and has not been taking care of herself lately. Tina Dalton has had palpitations for the last several days and ultimately synopsized in the shower this morning. Unknown time down, Tina Dalton denies head injury.  EKG on arrival was Atrial fibrillation with rates in the 130s. Tina Dalton was transported to [facility] for further evaluation.   HR 123, BP 112/83, O2 97% RA, RR 16 HS troponin x 2 negative Mg 2.2, K 3.5 TSH 0.244  Tina Dalton has been started on cardizem gtt running at 5 mg/hr nwo with rates in the 80s. Cardiology asked to evaluate.   Echo pending.  During my interview, her daughter is present at bedside and  helps with history. Her daughter sees Tina Dalton for PSVT. Tina Dalton reports caregiving for her sister who recently broke her back and has some mild dementia. Tina Dalton has not been eating well and has had some unintentional weight loss. Tina Dalton reports palpitations last evening. Tina Dalton woke up and showered, Tina Dalton felt  "woozy" in the shower and syncopized.   Tina Dalton is very active at baseline and denies any other cardiac symptoms. Tina Dalton is tolerating Afib now in the 80s. BP  111/57.  Tina Dalton denies palpitations currently. Eliquis has been started.    Past Medical History:  Diagnosis Date   Atrial fibrillation with RVR (HCC) 04/14/2023   Grade I diastolic dysfunction 04/14/2023   Heart murmur, systolic 01/02/2020   History of bilateral knee replacement 01/02/2020   Hypothyroidism    Mixed hyperlipidemia 04/01/2021   ASCVD 8.3% 03/2021; offered statin   Osteoarthritis of knee    Osteopenia after menopause 04/01/2020   DEXA July 2020: Lowest T score equals -1.9    Past Surgical History:  Procedure Laterality Date   CATARACT EXTRACTION W/ INTRAOCULAR LENS IMPLANT  2022   S/P Knee replacement   03/2015     Home Medications:  Prior to Admission medications   Medication Sig Start Date End Date Taking? Authorizing Provider  ezetimibe (ZETIA) 10 MG tablet Take 1 tablet (10 mg total) by mouth daily. 09/24/22   Tina Bunting, MD  levothyroxine (SYNTHROID) 100 MCG tablet Take 1 tablet (100 mcg total) by mouth daily. 04/06/23   Tina Ora, MD  metoprolol succinate (TOPROL-XL) 25 MG 24 hr tablet Take 1 tablet (25 mg total) by mouth at bedtime. 07/16/22   Tina Bunting, MD  rosuvastatin (Tina Dalton) 40 MG tablet Take 1 tablet (40 mg total) by mouth daily. 07/21/22  Tina Bunting, MD    Inpatient Medications: Scheduled Meds:  apixaban  5 mg Oral BID   ezetimibe  10 mg Oral Daily   metoprolol succinate  25 mg Oral QHS   rosuvastatin  40 mg Oral Daily   sodium chloride flush  3 mL Intravenous Q12H   Continuous Infusions:  diltiazem (CARDIZEM) infusion 5 mg/hr (04/14/23 0857)   PRN Meds: acetaminophen **OR** acetaminophen, ondansetron **OR** ondansetron (ZOFRAN) IV  Allergies:   No Known Allergies  Social History:   Social History   Socioeconomic History   Marital status: Divorced    Spouse  name: Not on file   Number of children: 1   Years of education: Not on file   Highest education level: Bachelor's degree (e.g., BA, AB, BS)  Occupational History   Not on file  Tobacco Use   Smoking status: Never   Smokeless tobacco: Never  Vaping Use   Vaping status: Never Used  Substance and Sexual Activity   Alcohol use: Yes    Comment: Rare   Drug use: Never   Sexual activity: Yes  Other Topics Concern   Not on file  Social History Narrative   1 daughter.1 grand dau   Retired Systems analyst   Social Determinants of Corporate investment banker Strain: Low Risk  (04/01/2023)   Overall Financial Resource Strain (CARDIA)    Difficulty of Paying Living Expenses: Not hard at all  Food Insecurity: No Food Insecurity (04/14/2023)   Hunger Vital Sign    Worried About Running Out of Food in the Last Year: Never true    Ran Out of Food in the Last Year: Never true  Transportation Needs: No Transportation Needs (04/14/2023)   PRAPARE - Administrator, Civil Service (Medical): No    Lack of Transportation (Non-Medical): No  Physical Activity: Inactive (04/01/2023)   Exercise Vital Sign    Days of Exercise per Week: 0 days    Minutes of Exercise per Session: 30 min  Stress: No Stress Concern Present (04/01/2023)   [employer] of Occupational Health - Occupational Stress Questionnaire    Feeling of Stress : Only a little  Social Connections: Socially Isolated (04/01/2023)   Social Connection and Isolation Panel [NHANES]    Frequency of Communication with Friends and Family: Once a week    Frequency of Social Gatherings with Friends and Family: Once a week    Attends Religious Services: Never    Database administrator or Organizations: No    Attends Banker Meetings: Never    Marital Status: Divorced  Catering manager Violence: Not At Risk (04/14/2023)   Humiliation, Afraid, Rape, and Kick questionnaire    Fear of Current or Ex-Partner: No     Emotionally Abused: No    Physically Abused: No    Sexually Abused: No    Family History:    Family History  Problem Relation Age of Onset   Alzheimer's disease Mother    Heart attack Father    Hypertension Father    Cancer Father        Skin   Coronary artery disease Father      ROS:  Please see the history of present illness.   All other ROS reviewed and negative.     Physical Exam/Data:   Vitals:   04/14/23 1130 04/14/23 1149 04/14/23 1150 04/14/23 1223  BP: (!) 102/59  (!) 93/45 (!) 111/57  Pulse: 86  72 85  Resp: 10  19 18  Temp:  97.8 F (36.6 C)  (!) 97.5 F (36.4 C)  TempSrc:  Oral  Oral  SpO2: 98%  98% 98%  Weight:      Height:        Intake/Output Summary (Last 24 hours) at 04/14/2023 1337 Last data filed at 04/14/2023 1337 Gross per 24 hour  Intake --  Output 350 ml  Net -350 ml      04/14/2023    7:38 AM 04/02/2023    8:43 AM 12/08/2022    8:09 AM  Last 3 Weights  Weight (lbs) 160 lb 162 lb 12.8 oz 168 lb  Weight (kg) 72.576 kg 73.846 kg 76.204 kg     Body mass index is 22.96 kg/m.  General:  Well nourished, well developed, in no acute distress HEENT: normal Neck: no JVD Vascular: No carotid bruits; Distal pulses 2+ bilaterally Cardiac:  irregular rhythm, regular rate, no murmur Lungs:  clear to auscultation bilaterally, no wheezing, rhonchi or rales  Abd: soft, nontender, no hepatomegaly  Ext: no edema Musculoskeletal:  No deformities, BUE and BLE strength normal and equal Skin: warm and dry  Neuro:  CNs 2-12 intact, no focal abnormalities noted Psych:  Normal affect   EKG:  The EKG was personally reviewed and demonstrates:  atrial fibrillation with VR 132 Telemetry:  Telemetry was personally reviewed and demonstrates:  rate controlled afib in the 80s  Relevant CV Studies:  Echo pending   Echo 2023: 1. Left ventricular ejection fraction, by estimation, is 55 to 60%. Left  ventricular ejection fraction by 3D volume is 55 %. The left  ventricle has  normal function. The left ventricle has no regional wall motion  abnormalities. There is mild asymmetric  left ventricular hypertrophy of the basal-septal segment. Left ventricular  diastolic parameters are consistent with Grade I diastolic dysfunction  (impaired relaxation). The average left ventricular global longitudinal  strain is -19.2 %. The global  longitudinal strain is normal.   2. Right ventricular systolic function is normal. The right ventricular  size is normal.   3. The mitral valve is grossly normal. Trivial mitral valve  regurgitation.   4. The aortic valve is tricuspid. There is mild calcification of the  aortic valve. There is mild thickening of the aortic valve. Aortic valve  regurgitation is mild. Aortic valve sclerosis/calcification is present,  without any evidence of aortic  stenosis.   5. The inferior vena cava is normal in size with greater than 50%  respiratory variability, suggesting right atrial pressure of 3 mmHg.   Laboratory Data:  High Sensitivity Troponin:   Recent Labs  Lab 04/14/23 0819 04/14/23 1001  TROPONINIHS 8 8     Chemistry Recent Labs  Lab 04/14/23 0819 04/14/23 1001  NA 140  --   K 3.5  --   CL 107  --   CO2 22  --   GLUCOSE 172*  --   BUN 12  --   CREATININE 0.92  --   CALCIUM 9.3  --   MG  --  2.2  GFRNONAA >60  --   ANIONGAP 11  --     Recent Labs  Lab 04/14/23 0819  PROT 7.0  ALBUMIN 3.8  AST 29  ALT 26  ALKPHOS 49  BILITOT 0.7   Lipids No results for input(s): "CHOL", "TRIG", "HDL", "LABVLDL", "LDLCALC", "CHOLHDL" in the last 168 hours.  Hematology Recent Labs  Lab 04/14/23 0819  WBC 9.1  RBC 5.02  HGB 14.3  HCT 44.7  MCV 89.0  MCH 28.5  MCHC 32.0  RDW 12.4  PLT 190   Thyroid  Recent Labs  Lab 04/14/23 0901  TSH 0.244*    BNPNo results for input(s): "BNP", "PROBNP" in the last 168 hours.  DDimer No results for input(s): "DDIMER" in the last 168 hours.   Radiology/Studies:   CT Head Wo Contrast  Result Date: 04/14/2023 CLINICAL DATA:  Syncopal episode, fall EXAM: CT HEAD WITHOUT CONTRAST TECHNIQUE: Contiguous axial images were obtained from the base of the skull through the vertex without intravenous contrast. RADIATION DOSE REDUCTION: This exam was performed according to the departmental dose-optimization program which includes automated exposure control, adjustment of the mA and/or kV according to patient size and/or use of iterative reconstruction technique. COMPARISON:  12/01/2020 FINDINGS: Brain: No evidence of acute infarction, hemorrhage, hydrocephalus, extra-axial collection or mass lesion/mass effect. Vascular: No hyperdense vessel or unexpected calcification. Skull: Normal. Negative for fracture or focal lesion. Sinuses/Orbits: No acute finding. Other: None. IMPRESSION: No acute intracranial abnormality by noncontrast CT. Electronically Signed   By: Judie Petit.  Shick M.D.   On: 04/14/2023 08:43   DG Chest Port 1 View  Result Date: 04/14/2023 CLINICAL DATA:  palpitations EXAM: PORTABLE CHEST 1 VIEW COMPARISON:  12/01/2020. FINDINGS: Bilateral lung fields are clear. Bilateral costophrenic angles are clear. Normal cardio-mediastinal silhouette. No acute osseous abnormalities. The soft tissues are within normal limits. IMPRESSION: No active disease. Electronically Signed   By: Jules Schick M.D.   On: 04/14/2023 08:21     Assessment and Plan:   Atrial fibrillation with RVR - new diagnosis this admission  - palpitations for the last several days - rate controlled with IV cardizem - echo pending Continue IV cardizem for now. We discussed that if Tina Dalton tolerates rate control well and echocardiogram without new systolic heart failure or valvular disease, we may be able to discharge with PO cardizem and eliquis and complete OP DCCV after three weeks. I will leave her NPO tonight at [facility] in the event of a cancellation for TEE-DCCV tomorrow if Tina Dalton develops difficult to control RVR  or hypotension. Given elevated coronary calcium score, would avoid flecainide until we can complete an ischemic evaluation. Given thyroid disease and age, would try to avoid amiodarone.    Need for chronic anticoagulation This patients CHA2DS2-VASc Score and unadjusted Ischemic Stroke Rate (% per year) is equal to 3.2 % stroke rate/year from a score of 3 (age, female, CAD) Has already started eliquis.   Syncope - episode while showering this morning after several days of palpitations - no head injury - suspect etiology of syncope is likely RVR with borderline BP, but will need counseling for driving restrictions for 6 months.   Elevated coronary calcium score 06/2022 - risk factor modification - historically no chest pain - HS troponin x 2 negative - if antiarrhythmic needed, will likely need a formal ischemic evaluation   Hyperlipidemia with LDL goal < 70 04/02/2023: Cholesterol 139; HDL 58.40; LDL Cholesterol 66; Triglycerides 73.0; VLDL 14.6 Remains on 40 mg Tina Dalton and 10 mg zetia     Risk Assessment/Risk Scores:     CHA2DS2-VASc Score = 3   This indicates a 3.2% annual risk of stroke. The patient's score is based upon: CHF History: 0 HTN History: 0 Diabetes History: 0 Stroke History: 0 Vascular Disease History: 1 Age Score: 1 Gender Score: 1      For questions or updates, please contact [facility] Please consult [email or web address] for contact info  under    Signed, Marcelino Duster, PA  04/14/2023 1:37 PM  Personally seen and examined. Agree with above.  72 year old with newly discovered atrial fibrillation with rapid ventricular response.  Felt palpitations for a day or 2.  Has been treated for PACs and PVCs for quite some time with Toprol.  Has not had any atrial fibrillation discovered on previous monitoring.  Tina Dalton was taking a warm shower and felt dizzy and then ended up on the shower floor, syncope.  About 5 years ago Tina Dalton had a syncopal episode  while overheated outside.  On exam very pleasant thin, irregularly irregular rhythm normal rate currently on IV diltiazem.  No edema.  Troponin is normal TSH normal.  Assessment and plan: A-fib paroxysmal - Eliquis 5 mg twice a day. - Should be able to transition diltiazem to p.o. -Echocardiogram to evaluate structure and function especially in the setting of syncope.  Syncope - Could possibly be related to atrial fibrillation with rapid ventricular response but may also be secondary to a vasovagal event similar to what Tina Dalton felt 5 years ago in the setting of dehydration in the heat. - Monitor here on telemetry  Coronary artery disease/elevated coronary calcium score - In the future if antiarrhythmic is needed, consider formal ischemic evaluation.  Donato Schultz, MD

## 2023-04-14 NOTE — ED Triage Notes (Signed)
Pt endorses palpitations for past few days. Pt had syncopal episode in shower this morning. Reports sacral soreness from the fall but does not think she hit her head. Denies CP or SOB. Pt reports she has been taking care of her sister the past few months and losing weight without trying.

## 2023-04-14 NOTE — ED Provider Notes (Signed)
[place] EMERGENCY DEPARTMENT AT [facility] Provider Note   CSN: [id number] Arrival date & time: 04/14/23  4782     History  Chief Complaint  Patient presents with   Loss of Consciousness    Tina Dalton is a 72 y.o. female.  Pt reports she has felt like she has been having palpitations.  Pt reports she has seen Dr. Jens Som in the past for palpitations.  Pt reports heart rate seems faster since yesterday.  Pt had a syncopal episode today in the shower.  No injuries.  Pt unsure of how long she was unconscious.  Pt's daughter reports pt has not been caring for herself because she has been caring for a family member.   The history is provided by the patient. No language interpreter was used.  Loss of Consciousness Episode history:  Single Most recent episode:  Today Timing:  Constant Progression:  Resolved Chronicity:  New Context: normal activity   Relieved by:  Nothing Ineffective treatments:  None tried Associated symptoms: palpitations   Associated symptoms: no chest pain and no shortness of breath        Home Medications Prior to Admission medications   Medication Sig Start Date End Date Taking? Authorizing Provider  ezetimibe (ZETIA) 10 MG tablet Take 1 tablet (10 mg total) by mouth daily. 09/24/22   Lewayne Bunting, MD  levothyroxine (SYNTHROID) 100 MCG tablet Take 1 tablet (100 mcg total) by mouth daily. 04/06/23   Willow Ora, MD  metoprolol succinate (TOPROL-XL) 25 MG 24 hr tablet Take 1 tablet (25 mg total) by mouth at bedtime. 07/16/22   Lewayne Bunting, MD  rosuvastatin (CRESTOR) 40 MG tablet Take 1 tablet (40 mg total) by mouth daily. 07/21/22   Lewayne Bunting, MD      Allergies    Patient has no known allergies.    Review of Systems   Review of Systems  Respiratory:  Negative for shortness of breath.   Cardiovascular:  Positive for palpitations and syncope. Negative for chest pain.  All other systems reviewed and are  negative.   Physical Exam Updated Vital Signs BP 112/83 (BP Location: Left Arm)   Pulse (!) 123   Temp 97.7 F (36.5 C) (Oral)   Resp 16   Ht 5\' 10"  (1.778 m)   Wt 72.6 kg   LMP  (LMP Unknown)   SpO2 97%   BMI 22.96 kg/m  Physical Exam Vitals and nursing note reviewed.  Constitutional:      Appearance: She is well-developed.  HENT:     Head: Normocephalic.     Right Ear: External ear normal.     Nose: Nose normal.     Mouth/Throat:     Mouth: Mucous membranes are moist.  Eyes:     Pupils: Pupils are equal, round, and reactive to light.  Cardiovascular:     Rate and Rhythm: Tachycardia present.  Pulmonary:     Effort: Pulmonary effort is normal.  Abdominal:     General: There is no distension.  Musculoskeletal:        General: Normal range of motion.     Cervical back: Normal range of motion.  Skin:    General: Skin is warm.  Neurological:     General: No focal deficit present.     Mental Status: She is alert and oriented to person, place, and time.  Psychiatric:        Mood and Affect: Mood normal.  ED Results / Procedures / Treatments   Labs (all labs ordered are listed, but only abnormal results are displayed) Labs Reviewed  CBC WITH DIFFERENTIAL/PLATELET  COMPREHENSIVE METABOLIC PANEL  TSH  TROPONIN I (HIGH SENSITIVITY)    EKG None  Radiology No results found.  Procedures Procedures    Medications Ordered in ED Medications  diltiazem (CARDIZEM) 1 mg/mL load via infusion 10 mg (has no administration in time range)    And  diltiazem (CARDIZEM) 125 mg in dextrose 5% 125 mL (1 mg/mL) infusion (has no administration in time range)    ED Course/ Medical Decision Making/ A&P                             Medical Decision Making Amount and/or Complexity of Data Reviewed Labs: ordered. Radiology: ordered.  Risk Prescription drug management. Decision regarding hospitalization.           Final Clinical Impression(s) / ED  Diagnoses Final diagnoses:  Atrial fibrillation, rapid (HCC)  Syncope, unspecified syncope type  Palpitation    Rx / DC Orders ED Discharge Orders     None      An After Visit Summary was printed and given to the patient.    Osie Cheeks 04/14/23 1326    Lorre Nick, MD 04/15/23 [phone]

## 2023-04-14 NOTE — Progress Notes (Signed)
ANTICOAGULATION CONSULT NOTE - Initial Consult  Pharmacy Consult for Eliquis Indication: atrial fibrillation  No Known Allergies  Patient Measurements: Height: 5\' 10"  (177.8 cm) Weight: 72.6 kg (160 lb) IBW/kg (Calculated) : 68.5  Vital Signs: Temp: 97.5 F (36.4 C) (07/24 1223) Temp Source: Oral (07/24 1223) BP: 111/57 (07/24 1223) Pulse Rate: 85 (07/24 1223)  Labs: Recent Labs    04/14/23 0819 04/14/23 1001  HGB 14.3  --   HCT 44.7  --   PLT 190  --   CREATININE 0.92  --   TROPONINIHS 8 8    Estimated Creatinine Clearance: 62.4 mL/min (by C-G formula based on SCr of 0.92 mg/dL).   Medical History: Past Medical History:  Diagnosis Date   Atrial fibrillation with RVR (HCC) 04/14/2023   Grade I diastolic dysfunction 04/14/2023   Heart murmur, systolic 01/02/2020   History of bilateral knee replacement 01/02/2020   Hypothyroidism    Mixed hyperlipidemia 04/01/2021   ASCVD 8.3% 03/2021; offered statin   Osteoarthritis of knee    Osteopenia after menopause 04/01/2020   DEXA July 2020: Lowest T score equals -1.9     Assessment: 72 year old female presented with syncopal episode. She reports having palpitations. Found to be in new onset atrial fibrillation. She has been started on a diltiazem infusion. Pharmacy consulted for Eliquis by Cardiology.   Plan:  -Eliquis 5 mg BID -Pharmacy to counsel patient on new therapy prior to discharge -Sign off consult; monitor CBC PRN  Pricilla Riffle, PharmD, BCPS Clinical Pharmacist 04/14/2023 1:40 PM

## 2023-04-14 NOTE — ED Provider Notes (Signed)
I provided a substantive portion of the care of this patient.  I personally made/approved the management plan for this patient and take responsibility for the patient management.  EKG Interpretation Date/Time:  Wednesday April 14 2023 07:38:06 EDT Ventricular Rate:  132 PR Interval:    QRS Duration:  75 QT Interval:  281 QTC Calculation: 417 R Axis:   44  Text Interpretation: Atrial fibrillation Low voltage, precordial leads Borderline repolarization abnormality Confirmed by Lorre Nick ([id number]) on 04/14/2023 8:44:42 AM   Patient's EKG per my interpretation shows A-fib.  Patient presents with syncope.  Was found to be in A-fib with RVR.  History of palpitations in the past.  No prior history of A-fib.  Plan will be for labs and x-rays and patient to be admitted   Lorre Nick, MD 04/14/23 [phone]

## 2023-04-14 NOTE — Discharge Instructions (Signed)

## 2023-04-14 NOTE — Progress Notes (Signed)
  Echocardiogram 2D Echocardiogram has been performed.  Leda Roys RDCS 04/14/2023, 2:47 PM

## 2023-04-14 NOTE — H&P (View-Only) (Signed)
Cardiology Consultation   Patient ID: Tina Dalton MRN: [id number]; DOB: 05/09/1951  Admit date: 04/14/2023 Date of Consult: 04/14/2023  PCP:  Willow Ora, MD   [facility] Providers Cardiologist:  Olga Millers, MD   Patient Profile:   Tina Dalton is a 72 y.o. female with a hx of palpitations, PSVT/PVCs, HLD, elevated coronary calcium score, and hypothyroidism who is being seen 04/14/2023 for the evaluation of atrial fibrillation with RVR at the request of Dr. Robb Matar.  History of Present Illness:   Tina Dalton established cardiology care with Dr. Jens Som for palpitations.  Heart monitor 06/2022 showed sinus rhythm with short runs of SVT, PVCs, ventricular triplet, and 8 beats of AIVR.  She had a coronary calcium score 06/2022 that was 34.9 placing her at the 61st percentile.  A follow-up echocardiogram 06/2022 showed a normal LVEF, grade 1 DD, mild AI.  She has been stable on 25 mg Toprol.  Hyperlipidemia has been managed with 40 mg Crestor and 10 mg Zetia.  She has been caring for a family member and has not been taking care of herself lately. She has had palpitations for the last several days and ultimately synopsized in the shower this morning. Unknown time down, she denies head injury.  EKG on arrival was Atrial fibrillation with rates in the 130s. She was transported to [facility] for further evaluation.   HR 123, BP 112/83, O2 97% RA, RR 16 HS troponin x 2 negative Mg 2.2, K 3.5 TSH 0.244  She has been started on cardizem gtt running at 5 mg/hr nwo with rates in the 80s. Cardiology asked to evaluate.   Echo pending.  During my interview, her daughter is present at bedside and  helps with history. Her daughter sees Dr. Elberta Fortis for PSVT. She reports caregiving for her sister who recently broke her back and has some mild dementia. She has not been eating well and has had some unintentional weight loss. She reports palpitations last evening. She woke up and showered, she felt  "woozy" in the shower and syncopized.   She is very active at baseline and denies any other cardiac symptoms. She is tolerating Afib now in the 80s. BP  111/57.  She denies palpitations currently. Eliquis has been started.    Past Medical History:  Diagnosis Date   Atrial fibrillation with RVR (HCC) 04/14/2023   Grade I diastolic dysfunction 04/14/2023   Heart murmur, systolic 01/02/2020   History of bilateral knee replacement 01/02/2020   Hypothyroidism    Mixed hyperlipidemia 04/01/2021   ASCVD 8.3% 03/2021; offered statin   Osteoarthritis of knee    Osteopenia after menopause 04/01/2020   DEXA July 2020: Lowest T score equals -1.9    Past Surgical History:  Procedure Laterality Date   CATARACT EXTRACTION W/ INTRAOCULAR LENS IMPLANT  2022   S/P Knee replacement   03/2015     Home Medications:  Prior to Admission medications   Medication Sig Start Date End Date Taking? Authorizing Provider  ezetimibe (ZETIA) 10 MG tablet Take 1 tablet (10 mg total) by mouth daily. 09/24/22   Lewayne Bunting, MD  levothyroxine (SYNTHROID) 100 MCG tablet Take 1 tablet (100 mcg total) by mouth daily. 04/06/23   Willow Ora, MD  metoprolol succinate (TOPROL-XL) 25 MG 24 hr tablet Take 1 tablet (25 mg total) by mouth at bedtime. 07/16/22   Lewayne Bunting, MD  rosuvastatin (CRESTOR) 40 MG tablet Take 1 tablet (40 mg total) by mouth daily. 07/21/22  Lewayne Bunting, MD    Inpatient Medications: Scheduled Meds:  apixaban  5 mg Oral BID   ezetimibe  10 mg Oral Daily   metoprolol succinate  25 mg Oral QHS   rosuvastatin  40 mg Oral Daily   sodium chloride flush  3 mL Intravenous Q12H   Continuous Infusions:  diltiazem (CARDIZEM) infusion 5 mg/hr (04/14/23 0857)   PRN Meds: acetaminophen **OR** acetaminophen, ondansetron **OR** ondansetron (ZOFRAN) IV  Allergies:   No Known Allergies  Social History:   Social History   Socioeconomic History   Marital status: Divorced    Spouse  name: Not on file   Number of children: 1   Years of education: Not on file   Highest education level: Bachelor's degree (e.g., BA, AB, BS)  Occupational History   Not on file  Tobacco Use   Smoking status: Never   Smokeless tobacco: Never  Vaping Use   Vaping status: Never Used  Substance and Sexual Activity   Alcohol use: Yes    Comment: Rare   Drug use: Never   Sexual activity: Yes  Other Topics Concern   Not on file  Social History Narrative   1 daughter.1 grand dau   Retired Systems analyst   Social Determinants of Corporate investment banker Strain: Low Risk  (04/01/2023)   Overall Financial Resource Strain (CARDIA)    Difficulty of Paying Living Expenses: Not hard at all  Food Insecurity: No Food Insecurity (04/14/2023)   Hunger Vital Sign    Worried About Running Out of Food in the Last Year: Never true    Ran Out of Food in the Last Year: Never true  Transportation Needs: No Transportation Needs (04/14/2023)   PRAPARE - Administrator, Civil Service (Medical): No    Lack of Transportation (Non-Medical): No  Physical Activity: Inactive (04/01/2023)   Exercise Vital Sign    Days of Exercise per Week: 0 days    Minutes of Exercise per Session: 30 min  Stress: No Stress Concern Present (04/01/2023)   [employer] of Occupational Health - Occupational Stress Questionnaire    Feeling of Stress : Only a little  Social Connections: Socially Isolated (04/01/2023)   Social Connection and Isolation Panel [NHANES]    Frequency of Communication with Friends and Family: Once a week    Frequency of Social Gatherings with Friends and Family: Once a week    Attends Religious Services: Never    Database administrator or Organizations: No    Attends Banker Meetings: Never    Marital Status: Divorced  Catering manager Violence: Not At Risk (04/14/2023)   Humiliation, Afraid, Rape, and Kick questionnaire    Fear of Current or Ex-Partner: No     Emotionally Abused: No    Physically Abused: No    Sexually Abused: No    Family History:    Family History  Problem Relation Age of Onset   Alzheimer's disease Mother    Heart attack Father    Hypertension Father    Cancer Father        Skin   Coronary artery disease Father      ROS:  Please see the history of present illness.   All other ROS reviewed and negative.     Physical Exam/Data:   Vitals:   04/14/23 1130 04/14/23 1149 04/14/23 1150 04/14/23 1223  BP: (!) 102/59  (!) 93/45 (!) 111/57  Pulse: 86  72 85  Resp: 10  19 18  Temp:  97.8 F (36.6 C)  (!) 97.5 F (36.4 C)  TempSrc:  Oral  Oral  SpO2: 98%  98% 98%  Weight:      Height:        Intake/Output Summary (Last 24 hours) at 04/14/2023 1337 Last data filed at 04/14/2023 1337 Gross per 24 hour  Intake --  Output 350 ml  Net -350 ml      04/14/2023    7:38 AM 04/02/2023    8:43 AM 12/08/2022    8:09 AM  Last 3 Weights  Weight (lbs) 160 lb 162 lb 12.8 oz 168 lb  Weight (kg) 72.576 kg 73.846 kg 76.204 kg     Body mass index is 22.96 kg/m.  General:  Well nourished, well developed, in no acute distress HEENT: normal Neck: no JVD Vascular: No carotid bruits; Distal pulses 2+ bilaterally Cardiac:  irregular rhythm, regular rate, no murmur Lungs:  clear to auscultation bilaterally, no wheezing, rhonchi or rales  Abd: soft, nontender, no hepatomegaly  Ext: no edema Musculoskeletal:  No deformities, BUE and BLE strength normal and equal Skin: warm and dry  Neuro:  CNs 2-12 intact, no focal abnormalities noted Psych:  Normal affect   EKG:  The EKG was personally reviewed and demonstrates:  atrial fibrillation with VR 132 Telemetry:  Telemetry was personally reviewed and demonstrates:  rate controlled afib in the 80s  Relevant CV Studies:  Echo pending   Echo 2023: 1. Left ventricular ejection fraction, by estimation, is 55 to 60%. Left  ventricular ejection fraction by 3D volume is 55 %. The left  ventricle has  normal function. The left ventricle has no regional wall motion  abnormalities. There is mild asymmetric  left ventricular hypertrophy of the basal-septal segment. Left ventricular  diastolic parameters are consistent with Grade I diastolic dysfunction  (impaired relaxation). The average left ventricular global longitudinal  strain is -19.2 %. The global  longitudinal strain is normal.   2. Right ventricular systolic function is normal. The right ventricular  size is normal.   3. The mitral valve is grossly normal. Trivial mitral valve  regurgitation.   4. The aortic valve is tricuspid. There is mild calcification of the  aortic valve. There is mild thickening of the aortic valve. Aortic valve  regurgitation is mild. Aortic valve sclerosis/calcification is present,  without any evidence of aortic  stenosis.   5. The inferior vena cava is normal in size with greater than 50%  respiratory variability, suggesting right atrial pressure of 3 mmHg.   Laboratory Data:  High Sensitivity Troponin:   Recent Labs  Lab 04/14/23 0819 04/14/23 1001  TROPONINIHS 8 8     Chemistry Recent Labs  Lab 04/14/23 0819 04/14/23 1001  NA 140  --   K 3.5  --   CL 107  --   CO2 22  --   GLUCOSE 172*  --   BUN 12  --   CREATININE 0.92  --   CALCIUM 9.3  --   MG  --  2.2  GFRNONAA >60  --   ANIONGAP 11  --     Recent Labs  Lab 04/14/23 0819  PROT 7.0  ALBUMIN 3.8  AST 29  ALT 26  ALKPHOS 49  BILITOT 0.7   Lipids No results for input(s): "CHOL", "TRIG", "HDL", "LABVLDL", "LDLCALC", "CHOLHDL" in the last 168 hours.  Hematology Recent Labs  Lab 04/14/23 0819  WBC 9.1  RBC 5.02  HGB 14.3  HCT 44.7  MCV 89.0  MCH 28.5  MCHC 32.0  RDW 12.4  PLT 190   Thyroid  Recent Labs  Lab 04/14/23 0901  TSH 0.244*    BNPNo results for input(s): "BNP", "PROBNP" in the last 168 hours.  DDimer No results for input(s): "DDIMER" in the last 168 hours.   Radiology/Studies:   CT Head Wo Contrast  Result Date: 04/14/2023 CLINICAL DATA:  Syncopal episode, fall EXAM: CT HEAD WITHOUT CONTRAST TECHNIQUE: Contiguous axial images were obtained from the base of the skull through the vertex without intravenous contrast. RADIATION DOSE REDUCTION: This exam was performed according to the departmental dose-optimization program which includes automated exposure control, adjustment of the mA and/or kV according to patient size and/or use of iterative reconstruction technique. COMPARISON:  12/01/2020 FINDINGS: Brain: No evidence of acute infarction, hemorrhage, hydrocephalus, extra-axial collection or mass lesion/mass effect. Vascular: No hyperdense vessel or unexpected calcification. Skull: Normal. Negative for fracture or focal lesion. Sinuses/Orbits: No acute finding. Other: None. IMPRESSION: No acute intracranial abnormality by noncontrast CT. Electronically Signed   By: Judie Petit.  Shick M.D.   On: 04/14/2023 08:43   DG Chest Port 1 View  Result Date: 04/14/2023 CLINICAL DATA:  palpitations EXAM: PORTABLE CHEST 1 VIEW COMPARISON:  12/01/2020. FINDINGS: Bilateral lung fields are clear. Bilateral costophrenic angles are clear. Normal cardio-mediastinal silhouette. No acute osseous abnormalities. The soft tissues are within normal limits. IMPRESSION: No active disease. Electronically Signed   By: Jules Schick M.D.   On: 04/14/2023 08:21     Assessment and Plan:   Atrial fibrillation with RVR - new diagnosis this admission  - palpitations for the last several days - rate controlled with IV cardizem - echo pending Continue IV cardizem for now. We discussed that if she tolerates rate control well and echocardiogram without new systolic heart failure or valvular disease, we may be able to discharge with PO cardizem and eliquis and complete OP DCCV after three weeks. I will leave her NPO tonight at MN in the event of a cancellation for TEE-DCCV tomorrow if she develops difficult to control RVR  or hypotension. Given elevated coronary calcium score, would avoid flecainide until we can complete an ischemic evaluation. Given thyroid disease and age, would try to avoid amiodarone.    Need for chronic anticoagulation This patients CHA2DS2-VASc Score and unadjusted Ischemic Stroke Rate (% per year) is equal to 3.2 % stroke rate/year from a score of 3 (age, female, CAD) Has already started eliquis.   Syncope - episode while showering this morning after several days of palpitations - no head injury - suspect etiology of syncope is likely RVR with borderline BP, but will need counseling for driving restrictions for 6 months.   Elevated coronary calcium score 06/2022 - risk factor modification - historically no chest pain - HS troponin x 2 negative - if antiarrhythmic needed, will likely need a formal ischemic evaluation   Hyperlipidemia with LDL goal < 70 04/02/2023: Cholesterol 139; HDL 58.40; LDL Cholesterol 66; Triglycerides 73.0; VLDL 14.6 Remains on 40 mg crestor and 10 mg zetia     Risk Assessment/Risk Scores:     CHA2DS2-VASc Score = 3   This indicates a 3.2% annual risk of stroke. The patient's score is based upon: CHF History: 0 HTN History: 0 Diabetes History: 0 Stroke History: 0 Vascular Disease History: 1 Age Score: 1 Gender Score: 1      For questions or updates, please contact [facility] Please consult [email or web address] for contact info  under    Signed, Marcelino Duster, PA  04/14/2023 1:37 PM  Personally seen and examined. Agree with above.  72 year old with newly discovered atrial fibrillation with rapid ventricular response.  Felt palpitations for a day or 2.  Has been treated for PACs and PVCs for quite some time with Toprol.  Has not had any atrial fibrillation discovered on previous monitoring.  She was taking a warm shower and felt dizzy and then ended up on the shower floor, syncope.  About 5 years ago she had a syncopal episode  while overheated outside.  On exam very pleasant thin, irregularly irregular rhythm normal rate currently on IV diltiazem.  No edema.  Troponin is normal TSH normal.  Assessment and plan: A-fib paroxysmal - Eliquis 5 mg twice a day. - Should be able to transition diltiazem to p.o. -Echocardiogram to evaluate structure and function especially in the setting of syncope.  Syncope - Could possibly be related to atrial fibrillation with rapid ventricular response but may also be secondary to a vasovagal event similar to what she felt 5 years ago in the setting of dehydration in the heat. - Monitor here on telemetry  Coronary artery disease/elevated coronary calcium score - In the future if antiarrhythmic is needed, consider formal ischemic evaluation.  Donato Schultz, MD

## 2023-04-14 NOTE — Plan of Care (Signed)
  Problem: Cardiac: Goal: Will achieve and/or maintain adequate cardiac output Outcome: Progressing   Problem: Education: Goal: Knowledge of General Education information will improve Description: Including pain rating scale, medication(s)/side effects and non-pharmacologic comfort measures Outcome: Progressing   Problem: Activity: Goal: Risk for activity intolerance will decrease Outcome: Progressing   Problem: Coping: Goal: Level of anxiety will decrease Outcome: Progressing   Problem: Elimination: Goal: Will not experience complications related to urinary retention Outcome: Progressing   Problem: Pain Managment: Goal: General experience of comfort will improve Outcome: Progressing   Problem: Safety: Goal: Ability to remain free from injury will improve Outcome: Progressing   Problem: Skin Integrity: Goal: Risk for impaired skin integrity will decrease Outcome: Progressing   Problem: Education: Goal: Knowledge of disease or condition will improve Outcome: Progressing Goal: Understanding of medication regimen will improve Outcome: Progressing   Problem: Activity: Goal: Ability to tolerate increased activity will improve Outcome: Progressing

## 2023-04-14 NOTE — H&P (Signed)
History and Physical    Patient: Tina Dalton QMV:[id number] DOB: 1950-11-23 DOA: 04/14/2023 DOS: the patient was seen and examined on 04/14/2023 PCP: Willow Ora, MD  Patient coming from: Home  Chief Complaint:  Chief Complaint  Patient presents with   Loss of Consciousness   HPI: Telly Broberg is a 72 y.o. female with medical history significant of heart murmur, bilateral knee replacement, acquired hypothyroidism, hyperlipidemia, osteoarthritis of the knees, osteopenia, grade 1 diastolic dysfunction, vitamin D deficiency who presented to the emergency department complaints of loss of consciousness after getting lightheaded in the shower.  She has been having dizziness and palpitations recently.  No chest pain, diaphoresis, PND, orthopnea or pitting edema of the lower extremities.   She denied fever, chills, rhinorrhea, sore throat, wheezing or hemoptysis. No abdominal pain, nausea, emesis, diarrhea, constipation, melena or hematochezia.  No flank pain, dysuria, frequency or hematuria.  No polyuria, polydipsia, polyphagia or blurred vision.   Lab work: Her CBC, troponin x 2 and CMP were normal except for blood glucose of 172 mg/dL.  TSH level was 0 2.44 IU/mL.  Imaging: Portable 1 view chest radiograph with normal cardiomediastinal silhouette.  No active disease.  CT head without contrast with no acute intracranial normality.   ED course: Initial vital signs were temperature 97.7 F, pulse 123, respiration 16, BP 112/83 mmHg O2 sat 97% on room air.  The patient received NS 1000 mL bolus, Cardizem 10 mg IVP x 1 and was continued on diltiazem infusion afterwards.  Review of Systems: As mentioned in the history of present illness. All other systems reviewed and are negative.  Past Medical History:  Diagnosis Date   Heart murmur, systolic 01/02/2020   History of bilateral knee replacement 01/02/2020   Hypothyroidism    Mixed hyperlipidemia 04/01/2021   ASCVD 8.3% 03/2021; offered statin    Osteoarthritis of knee    Osteopenia after menopause 04/01/2020   DEXA July 2020: Lowest T score equals -1.9   Past Surgical History:  Procedure Laterality Date   CATARACT EXTRACTION W/ INTRAOCULAR LENS IMPLANT  2022   S/P Knee replacement   03/2015   Social History:  reports that she has never smoked. She has never used smokeless tobacco. She reports current alcohol use. She reports that she does not use drugs.  No Known Allergies  Family History  Problem Relation Age of Onset   Alzheimer's disease Mother    Heart attack Father    Hypertension Father    Cancer Father        Skin   Coronary artery disease Father     Prior to Admission medications   Medication Sig Start Date End Date Taking? Authorizing Provider  ezetimibe (ZETIA) 10 MG tablet Take 1 tablet (10 mg total) by mouth daily. 09/24/22   Lewayne Bunting, MD  levothyroxine (SYNTHROID) 100 MCG tablet Take 1 tablet (100 mcg total) by mouth daily. 04/06/23   Willow Ora, MD  metoprolol succinate (TOPROL-XL) 25 MG 24 hr tablet Take 1 tablet (25 mg total) by mouth at bedtime. 07/16/22   Lewayne Bunting, MD  rosuvastatin (CRESTOR) 40 MG tablet Take 1 tablet (40 mg total) by mouth daily. 07/21/22   Lewayne Bunting, MD    Physical Exam: Vitals:   04/14/23 0915 04/14/23 0930 04/14/23 0940 04/14/23 0950  BP: (!) 113/57 118/73 124/64 117/67  Pulse: 85 100 (!) 123 90  Resp: 13 19 16 11   Temp:      TempSrc:  SpO2: 98% 97% 96% 98%  Weight:      Height:       Physical Exam Vitals and nursing note reviewed.  HENT:     Head: Normocephalic.     Nose: No rhinorrhea.     Mouth/Throat:     Mouth: Mucous membranes are moist.  Eyes:     General: No scleral icterus.    Pupils: Pupils are equal, round, and reactive to light.  Cardiovascular:     Rate and Rhythm: Tachycardia present. Rhythm irregular.  Pulmonary:     Breath sounds: No wheezing or rales.  Abdominal:     General: Bowel sounds are normal.      Palpations: Abdomen is soft.  Genitourinary:    General: Normal vulva.     Rectum: Normal.  Musculoskeletal:     Cervical back: Neck supple.     Right lower leg: No edema.     Left lower leg: No edema.  Skin:    General: Skin is warm and dry.  Neurological:     General: No focal deficit present.     Mental Status: She is alert.  Psychiatric:        Mood and Affect: Mood normal.        Behavior: Behavior normal.    Data Reviewed:  Results are pending, will review when available.  07/15/2022 transthoracic echocardiogram IMPRESSIONS:   1. Left ventricular ejection fraction, by estimation, is 55 to 60%. Left  ventricular ejection fraction by 3D volume is 55 %. The left ventricle has  normal function. The left ventricle has no regional wall motion  abnormalities. There is mild asymmetric  left ventricular hypertrophy of the basal-septal segment. Left ventricular  diastolic parameters are consistent with Grade I diastolic dysfunction  (impaired relaxation). The average left ventricular global longitudinal  strain is -19.2 %. The global  longitudinal strain is normal.   2. Right ventricular systolic function is normal. The right ventricular  size is normal.   3. The mitral valve is grossly normal. Trivial mitral valve  regurgitation.   4. The aortic valve is tricuspid. There is mild calcification of the  aortic valve. There is mild thickening of the aortic valve. Aortic valve  regurgitation is mild. Aortic valve sclerosis/calcification is present,  without any evidence of aortic  stenosis.   5. The inferior vena cava is normal in size with greater than 50%  respiratory variability, suggesting right atrial pressure of 3 mmHg.   Comparison(s): Compared to prior TTE in 12/2019, there is no significant  change.   EKG: Vent. rate 132 BPM PR interval * ms QRS duration 75 ms QT/QTcB 281/417 ms P-R-T axes * 44 96 Atrial fibrillation Low voltage, precordial leads Borderline  repolarization abnormality  Assessment and Plan: Principal Problem:   Atrial fibrillation with RVR (HCC) Observation/PCU. Continue DOAC. Continue diltiazem infusion. Continue beta-blocker twice daily. Keep electrolytes optimized. Check echocardiogram. Cardiology consult appreciated. -Will follow their recommendations.  Active Problems:   Acquired hypothyroidism Previous TSH 0.08 IU/mL Hold levothyroxine for 2 days. May resume at 100 mcg daily 6 times a week Or may use 88.5 mcg tablet daily. Would try to target a TSH level closer to the higher end of normal.    Coronary artery calcification Continue rosuvastatin, ezetimibe and metoprolol. No antiplatelet therapy since the patient is on anticoagulation. Follow-up with cardiology as an outpatient.   Grade I diastolic dysfunction No signs of volume overload. Echocardiogram ordered.    Mixed hyperlipidemia Continue rosuvastatin 40 mg  p.o. daily.     Advance Care Planning:   Code Status: Full Code   Consults: Cardiology Donato Schultz, MD).  Family Communication:   Severity of Illness: The appropriate patient status for this patient is OBSERVATION. Observation status is judged to be reasonable and necessary in order to provide the required intensity of service to ensure the patient's safety. The patient's presenting symptoms, physical exam findings, and initial radiographic and laboratory data in the context of their medical condition is felt to place them at decreased risk for further clinical deterioration. Furthermore, it is anticipated that the patient will be medically stable for discharge from the hospital within 2 midnights of admission.   Author: Bobette Mo, MD 04/14/2023 11:12 AM  For on call review [email or web address].   This document was prepared using Dragon voice recognition software and may contain some unintended transcription errors.

## 2023-04-14 NOTE — ED Notes (Signed)
ED TO INPATIENT HANDOFF REPORT  Name/Age/Gender Tina Dalton 72 y.o. female  Code Status    Code Status Orders  (From admission, onward)           Start     Ordered   04/14/23 1123  Full code  Continuous       Question:  By:  Answer:  Consent: discussion documented in EHR   04/14/23 1122           Code Status History     Date Active Date Inactive Code Status Order ID Comments User Context   04/14/2023 1119 04/14/2023 1122 Full Code [id number]  Bobette Mo, MD ED       Home/SNF/Other Home  Chief Complaint Atrial fibrillation with RVR [facility]) [I48.91]  Level of Care/Admitting Diagnosis ED Disposition     ED Disposition  Admit   Condition  --   Comment  Hospital Area: [facility] [[id number]]  Level of Care: Progressive [102]  Admit to Progressive based on following criteria: CARDIOVASCULAR & THORACIC of moderate stability with acute coronary syndrome symptoms/low risk myocardial infarction/hypertensive urgency/arrhythmias/heart failure potentially compromising stability and stable post cardiovascular intervention patients.  May place patient in observation at [facility] or [facility] if equivalent level of care is available:: No  Covid Evaluation: Asymptomatic - no recent exposure (last 10 days) testing not required  Diagnosis: Atrial fibrillation with RVR [facility]) [[id number]]  Admitting Physician: Bobette Mo [[id number]]  Attending Physician: Bobette Mo [[id number]]          Medical History Past Medical History:  Diagnosis Date   Atrial fibrillation with RVR (HCC) 04/14/2023   Grade I diastolic dysfunction 04/14/2023   Heart murmur, systolic 01/02/2020   History of bilateral knee replacement 01/02/2020   Hypothyroidism    Mixed hyperlipidemia 04/01/2021   ASCVD 8.3% 03/2021; offered statin   Osteoarthritis of knee    Osteopenia after menopause 04/01/2020   DEXA July 2020: Lowest T score equals -1.9    Allergies No  Known Allergies  IV Location/Drains/Wounds Patient Lines/Drains/Airways Status     Active Line/Drains/Airways     Name Placement date Placement time Site Days   Peripheral IV 04/14/23 20 G Posterior;Right Forearm 04/14/23  0840  Forearm  less than 1   Peripheral IV 04/14/23 20 G Anterior;Proximal;Right Forearm 04/14/23  0855  Forearm  less than 1            Labs/Imaging Results for orders placed or performed during the hospital encounter of 04/14/23 (from the past 48 hour(s))  CBC with Differential     Status: None   Collection Time: 04/14/23  8:19 AM  Result Value Ref Range   WBC 9.1 4.0 - 10.5 K/uL   RBC 5.02 3.87 - 5.11 MIL/uL   Hemoglobin 14.3 12.0 - 15.0 g/dL   HCT 16.0 10.9 - 32.3 %   MCV 89.0 80.0 - 100.0 fL   MCH 28.5 26.0 - 34.0 pg   MCHC 32.0 30.0 - 36.0 g/dL   RDW 55.7 32.2 - 02.5 %   Platelets 190 150 - 400 K/uL   nRBC 0.0 0.0 - 0.2 %   Neutrophils Relative % 81 %   Neutro Abs 7.4 1.7 - 7.7 K/uL   Lymphocytes Relative 12 %   Lymphs Abs 1.1 0.7 - 4.0 K/uL   Monocytes Relative 6 %   Monocytes Absolute 0.6 0.1 - 1.0 K/uL   Eosinophils Relative 0 %   Eosinophils Absolute 0.0 0.0 - 0.5  K/uL   Basophils Relative 1 %   Basophils Absolute 0.1 0.0 - 0.1 K/uL   Immature Granulocytes 0 %   Abs Immature Granulocytes 0.02 0.00 - 0.07 K/uL    Comment: Performed at [facility], [address], [place], [place] [address]  Comprehensive metabolic panel     Status: Abnormal   Collection Time: 04/14/23  8:19 AM  Result Value Ref Range   Sodium 140 135 - 145 mmol/L   Potassium 3.5 3.5 - 5.1 mmol/L   Chloride 107 98 - 111 mmol/L   CO2 22 22 - 32 mmol/L   Glucose, Bld 172 (H) 70 - 99 mg/dL    Comment: Glucose reference range applies only to samples taken after fasting for at least 8 hours.   BUN 12 8 - 23 mg/dL   Creatinine, Ser 1.91 0.44 - 1.00 mg/dL   Calcium 9.3 8.9 - 47.8 mg/dL   Total Protein 7.0 6.5 - 8.1 g/dL   Albumin 3.8 3.5 - 5.0 g/dL    AST 29 15 - 41 U/L   ALT 26 0 - 44 U/L   Alkaline Phosphatase 49 38 - 126 U/L   Total Bilirubin 0.7 0.3 - 1.2 mg/dL   GFR, Estimated >29 >56 mL/min    Comment: (NOTE) Calculated using the CKD-EPI Creatinine Equation (2021)    Anion gap 11 5 - 15    Comment: Performed at [facility], [address] [place], [place] [address]  Troponin I (High Sensitivity)     Status: None   Collection Time: 04/14/23  8:19 AM  Result Value Ref Range   Troponin I (High Sensitivity) 8 <18 ng/L    Comment: (NOTE) Elevated high sensitivity troponin I (hsTnI) values and significant  changes across serial measurements may suggest ACS but many other  chronic and acute conditions are known to elevate hsTnI results.  Refer to the "Links" section for chest pain algorithms and additional  guidance. Performed at [facility], [address] [place], [place] [address]   TSH     Status: Abnormal   Collection Time: 04/14/23  9:01 AM  Result Value Ref Range   TSH 0.244 (L) 0.350 - 4.500 uIU/mL    Comment: Performed by a 3rd Generation assay with a functional sensitivity of <=0.01 uIU/mL. Performed at [facility], [address] [place], [place] [address]   Troponin I (High Sensitivity)     Status: None   Collection Time: 04/14/23 10:01 AM  Result Value Ref Range   Troponin I (High Sensitivity) 8 <18 ng/L    Comment: (NOTE) Elevated high sensitivity troponin I (hsTnI) values and significant  changes across serial measurements may suggest ACS but many other  chronic and acute conditions are known to elevate hsTnI results.  Refer to the "Links" section for chest pain algorithms and additional  guidance. Performed at [facility], [address] [place], [place] [address]   Magnesium     Status: None   Collection Time: 04/14/23 10:01 AM  Result Value Ref Range   Magnesium 2.2 1.7 - 2.4 mg/dL    Comment: Performed at [facility], [address], [place], [place] [address]  Phosphorus     Status: None   Collection Time: 04/14/23 10:01 AM  Result Value Ref Range   Phosphorus 2.7 2.5 - 4.6 mg/dL    Comment: Performed at [facility], [address] [place], [place] [address]  CT Head Wo Contrast  Result Date: 04/14/2023 CLINICAL DATA:  Syncopal episode, fall EXAM: CT HEAD WITHOUT CONTRAST TECHNIQUE: Contiguous axial images were obtained from the base of the skull through the vertex without intravenous contrast. RADIATION DOSE REDUCTION: This exam was performed according to the departmental dose-optimization program which includes automated exposure control, adjustment of the mA and/or kV according to patient size and/or use of iterative reconstruction technique. COMPARISON:  12/01/2020 FINDINGS: Brain: No evidence of acute infarction, hemorrhage, hydrocephalus, extra-axial collection or mass lesion/mass effect. Vascular: No hyperdense vessel or unexpected calcification. Skull: Normal. Negative for fracture or focal lesion. Sinuses/Orbits: No acute finding. Other: None. IMPRESSION: No acute intracranial abnormality by noncontrast CT. Electronically Signed   By: Judie Petit.  Shick M.D.   On: 04/14/2023 08:43   DG Chest Port 1 View  Result Date: 04/14/2023 CLINICAL DATA:  palpitations EXAM: PORTABLE CHEST 1 VIEW COMPARISON:  12/01/2020. FINDINGS: Bilateral lung fields are clear. Bilateral costophrenic angles are clear. Normal cardio-mediastinal silhouette. No acute osseous abnormalities. The soft tissues are within normal limits. IMPRESSION: No active disease. Electronically Signed   By: Jules Schick M.D.   On: 04/14/2023 08:21    Pending Labs Unresulted Labs (From admission, onward)     Start     Ordered   04/15/23 0500  HIV Antibody (routine testing w rflx)  (HIV Antibody (Routine testing w reflex) panel)  Tomorrow morning,   R        04/14/23 1119   04/15/23 0500  CBC  Tomorrow morning,    R        04/14/23 1119   04/15/23 0500  Comprehensive metabolic panel  Tomorrow morning,   R        04/14/23 1119   04/14/23 0801  Urinalysis, Routine w reflex microscopic -Urine, Clean Catch  Once,   URGENT       Question:  Specimen Source  Answer:  Urine, Clean Catch   04/14/23 0800            Vitals/Pain Today's Vitals   04/14/23 1000 04/14/23 1030 04/14/23 1100 04/14/23 1149  BP: 114/62 113/76 (!) 95/57   Pulse: 88 89 85   Resp: 16 17 15    Temp:    97.8 F (36.6 C)  TempSrc:    Oral  SpO2: 100% 98% 99%   Weight:      Height:      PainSc:        Isolation Precautions No active isolations  Medications Medications  diltiazem (CARDIZEM) 1 mg/mL load via infusion 10 mg (10 mg Intravenous Bolus from Bag 04/14/23 0858)    And  diltiazem (CARDIZEM) 125 mg in dextrose 5% 125 mL (1 mg/mL) infusion (5 mg/hr Intravenous New Bag/Given 04/14/23 0857)  potassium chloride SA (KLOR-CON M) CR tablet 40 mEq (has no administration in time range)  acetaminophen (TYLENOL) tablet 650 mg (has no administration in time range)    Or  acetaminophen (TYLENOL) suppository 650 mg (has no administration in time range)  ondansetron (ZOFRAN) tablet 4 mg (has no administration in time range)    Or  ondansetron (ZOFRAN) injection 4 mg (has no administration in time range)  sodium chloride flush (NS) 0.9 % injection 3 mL (has no administration in time range)  sodium chloride 0.9 % bolus 1,000 mL (0 mLs Intravenous Stopped 04/14/23 0950)    Mobility walks

## 2023-04-15 ENCOUNTER — Other Ambulatory Visit: Payer: Self-pay

## 2023-04-15 ENCOUNTER — Encounter (HOSPITAL_COMMUNITY)
Admission: EM | Disposition: A | Discharge status: Home / Self Care | Payer: Self-pay | Source: Home / Self Care | Attending: Emergency Medicine

## 2023-04-15 ENCOUNTER — Observation Stay (HOSPITAL_COMMUNITY): Payer: Medicare Other | Admitting: Anesthesiology

## 2023-04-15 ENCOUNTER — Observation Stay (HOSPITAL_BASED_OUTPATIENT_CLINIC_OR_DEPARTMENT_OTHER): Payer: Medicare Other | Admitting: Anesthesiology

## 2023-04-15 ENCOUNTER — Observation Stay (HOSPITAL_BASED_OUTPATIENT_CLINIC_OR_DEPARTMENT_OTHER): Payer: Medicare Other

## 2023-04-15 ENCOUNTER — Encounter (HOSPITAL_COMMUNITY): Payer: Self-pay

## 2023-04-15 DIAGNOSIS — I4891 Unspecified atrial fibrillation: Secondary | ICD-10-CM | POA: Diagnosis not present

## 2023-04-15 DIAGNOSIS — E782 Mixed hyperlipidemia: Secondary | ICD-10-CM | POA: Diagnosis not present

## 2023-04-15 DIAGNOSIS — E039 Hypothyroidism, unspecified: Secondary | ICD-10-CM | POA: Diagnosis not present

## 2023-04-15 DIAGNOSIS — I2510 Atherosclerotic heart disease of native coronary artery without angina pectoris: Secondary | ICD-10-CM

## 2023-04-15 DIAGNOSIS — R002 Palpitations: Secondary | ICD-10-CM | POA: Diagnosis not present

## 2023-04-15 DIAGNOSIS — I480 Paroxysmal atrial fibrillation: Secondary | ICD-10-CM

## 2023-04-15 DIAGNOSIS — Z79899 Other long term (current) drug therapy: Secondary | ICD-10-CM | POA: Diagnosis not present

## 2023-04-15 DIAGNOSIS — Z96653 Presence of artificial knee joint, bilateral: Secondary | ICD-10-CM | POA: Diagnosis not present

## 2023-04-15 HISTORY — PX: CARDIOVERSION: SHX1299

## 2023-04-15 HISTORY — PX: TEE WITHOUT CARDIOVERSION: SHX5443

## 2023-04-15 LAB — CBC
HCT: 42.9 % (ref 36.0–46.0)
MCH: 28.5 pg (ref 26.0–34.0)
MCHC: 31.7 g/dL (ref 30.0–36.0)
MCV: 89.9 fL (ref 80.0–100.0)
Platelets: 176 10*3/uL (ref 150–400)
RBC: 4.77 MIL/uL (ref 3.87–5.11)
RDW: 12.4 % (ref 11.5–15.5)
WBC: 10.3 10*3/uL (ref 4.0–10.5)
nRBC: 0 % (ref 0.0–0.2)

## 2023-04-15 LAB — HIV ANTIBODY (ROUTINE TESTING W REFLEX): HIV Screen 4th Generation wRfx: NONREACTIVE

## 2023-04-15 LAB — COMPREHENSIVE METABOLIC PANEL
ALT: 24 U/L (ref 0–44)
AST: 23 U/L (ref 15–41)
Albumin: 3.6 g/dL (ref 3.5–5.0)
Anion gap: 7 (ref 5–15)
BUN: 13 mg/dL (ref 8–23)
CO2: 26 mmol/L (ref 22–32)
Calcium: 9 mg/dL (ref 8.9–10.3)
Chloride: 108 mmol/L (ref 98–111)
Creatinine, Ser: 0.89 mg/dL (ref 0.44–1.00)
Glucose, Bld: 94 mg/dL (ref 70–99)
Potassium: 3.9 mmol/L (ref 3.5–5.1)
Total Protein: 6.5 g/dL (ref 6.5–8.1)

## 2023-04-15 LAB — GLUCOSE, CAPILLARY: Glucose-Capillary: 95 mg/dL (ref 70–99)

## 2023-04-15 LAB — ECHO TEE

## 2023-04-15 SURGERY — ECHOCARDIOGRAM, TRANSESOPHAGEAL
Anesthesia: Monitor Anesthesia Care

## 2023-04-15 MED ORDER — DILTIAZEM HCL ER COATED BEADS 120 MG PO CP24: 120.0000 mg | ORAL_CAPSULE | Freq: Every day | ORAL | 0 refills | Status: DC

## 2023-04-15 MED ORDER — DILTIAZEM HCL ER COATED BEADS 120 MG PO CP24
120.0000 mg | ORAL_CAPSULE | Freq: Every day | ORAL | Status: DC
  Administered 2023-04-15: 120 mg via ORAL
  Filled 2023-04-15: qty 1

## 2023-04-15 MED ORDER — PROPOFOL 10 MG/ML IV BOLUS
INTRAVENOUS | Status: DC | PRN
Start: 2023-04-15 — End: 2023-04-15
  Administered 2023-04-15: 20 mg via INTRAVENOUS
  Administered 2023-04-15: 50 mg via INTRAVENOUS

## 2023-04-15 MED ORDER — APIXABAN 5 MG PO TABS: 5.0000 mg | ORAL_TABLET | Freq: Two times a day (BID) | ORAL | 0 refills | Status: DC

## 2023-04-15 MED ORDER — LEVOTHYROXINE SODIUM 88 MCG PO TABS: 88.0000 ug | ORAL_TABLET | Freq: Every day | ORAL | Status: DC

## 2023-04-15 MED ORDER — LEVOTHYROXINE SODIUM 88 MCG PO TABS: 88.0000 ug | ORAL_TABLET | Freq: Every day | ORAL | 0 refills | Status: DC

## 2023-04-15 MED ORDER — PROPOFOL 500 MG/50ML IV EMUL
INTRAVENOUS | Status: DC | PRN
  Administered 2023-04-15: 120 ug/kg/min via INTRAVENOUS

## 2023-04-15 MED ORDER — POLYETHYLENE GLYCOL 3350 17 G PO PACK
17.0000 g | PACK | Freq: Every day | ORAL | Status: DC | PRN
  Filled 2023-04-15: qty 1

## 2023-04-15 MED ORDER — DILTIAZEM HCL-DEXTROSE 125-5 MG/125ML-% IV SOLN (PREMIX)
5.0000 mg/h | INTRAVENOUS | Status: DC
  Filled 2023-04-15: qty 125

## 2023-04-15 MED ORDER — SODIUM CHLORIDE 0.9 % IV SOLN: INTRAVENOUS | Status: DC

## 2023-04-15 MED ORDER — LIDOCAINE 2% (20 MG/ML) 5 ML SYRINGE
INTRAMUSCULAR | Status: DC | PRN
  Administered 2023-04-15: 80 mg via INTRAVENOUS

## 2023-04-15 SURGICAL SUPPLY — 1 items: ELECT DEFIB PAD ADLT CADENCE (PAD) ×1 IMPLANT

## 2023-04-15 NOTE — Anesthesia Preprocedure Evaluation (Signed)
Anesthesia Evaluation  Patient identified by MRN, date of birth, ID band Patient awake    Reviewed: Allergy & Precautions, H&P , NPO status , Patient's Chart, lab work & pertinent test results  Airway Mallampati: II   Neck ROM: full    Dental   Pulmonary neg pulmonary ROS   breath sounds clear to auscultation       Cardiovascular + CAD  + dysrhythmias Atrial Fibrillation  Rhythm:irregular Rate:Normal     Neuro/Psych    GI/Hepatic   Endo/Other  Hypothyroidism    Renal/GU      Musculoskeletal  (+) Arthritis ,    Abdominal   Peds  Hematology   Anesthesia Other Findings   Reproductive/Obstetrics                             Anesthesia Physical Anesthesia Plan  ASA: 3  Anesthesia Plan: MAC   Post-op Pain Management:    Induction: Intravenous  PONV Risk Score and Plan: 2 and Propofol infusion and Treatment may vary due to age or medical condition  Airway Management Planned: Nasal Cannula  Additional Equipment:   Intra-op Plan:   Post-operative Plan:   Informed Consent: I have reviewed the patients History and Physical, chart, labs and discussed the procedure including the risks, benefits and alternatives for the proposed anesthesia with the patient or authorized representative who has indicated his/her understanding and acceptance.     Dental advisory given  Plan Discussed with: CRNA, Anesthesiologist and Surgeon  Anesthesia Plan Comments:        Anesthesia Quick Evaluation

## 2023-04-15 NOTE — Progress Notes (Signed)
Pt being discharged to home at this time. Prior to DC, IV and tele was removed. Pt was given discharge instructions regarding medications, procedure, and follow up appointments. Pt verbalized understanding and stated no other concerns at this time. Pt stable at time of DC and left in personal vehicle driven by daughter.

## 2023-04-15 NOTE — Transfer of Care (Signed)
Immediate Anesthesia Transfer of Care Note  Patient: Tina Dalton  Procedure(s) Performed: TRANSESOPHAGEAL ECHOCARDIOGRAM CARDIOVERSION  Patient Location: PACU and Cath Lab  Anesthesia Type:MAC  Level of Consciousness: drowsy  Airway & Oxygen Therapy: Patient Spontanous Breathing  Post-op Assessment: Report given to RN and Post -op Vital signs reviewed and stable  Post vital signs: Reviewed and stable  Last Vitals:  Vitals Value Taken Time  BP 96/58   Temp    Pulse 88   Resp 14   SpO2 97     Last Pain:  Vitals:   04/15/23 1243  TempSrc:   PainSc: 0-No pain         Complications: No notable events documented.

## 2023-04-15 NOTE — Progress Notes (Signed)
   [facility] has been requested to perform a transesophageal echocardiogram on Tina Dalton for atrial fibrillation.  After careful review of history and examination, the risks and benefits of transesophageal echocardiogram have been explained including risks of esophageal damage, perforation (1:10,000 risk), bleeding, pharyngeal hematoma as well as other potential complications associated with conscious sedation including aspiration, arrhythmia, respiratory failure and death. Alternatives to treatment were discussed, questions were answered. Patient is willing to proceed.   Roe Rutherford Joeline Freer, [place]  04/15/2023 10:23 AM

## 2023-04-15 NOTE — Progress Notes (Addendum)
Rounding Note    Patient Name: Tina Dalton Date of Encounter: 04/15/2023   [facility] Cardiologist: Olga Millers, MD   Subjective   Pt asymptomatic, no further syncope. Keep NPO for now  Inpatient Medications    Scheduled Meds:  apixaban  5 mg Oral BID   diltiazem  120 mg Oral Daily   ezetimibe  10 mg Oral Daily   feeding supplement  237 mL Oral BID BM   metoprolol succinate  25 mg Oral QHS   rosuvastatin  40 mg Oral Daily   sodium chloride flush  3 mL Intravenous Q12H   Continuous Infusions:  PRN Meds: acetaminophen **OR** acetaminophen, ondansetron **OR** ondansetron (ZOFRAN) IV, mouth rinse, polyethylene glycol   Vital Signs    Vitals:   04/14/23 2159 04/15/23 0000 04/15/23 0455 04/15/23 0502  BP: (!) 94/57 110/64 119/63 (!) 102/53  Pulse: 88  79 72  Resp: 18  20 20   Temp: 98.2 F (36.8 C)  98.1 F (36.7 C) 98.2 F (36.8 C)  TempSrc: Oral  Oral Oral  SpO2: 97%  98% 97%  Weight:      Height:        Intake/Output Summary (Last 24 hours) at 04/15/2023 0952 Last data filed at 04/15/2023 0600 Gross per 24 hour  Intake 1019.85 ml  Output 1750 ml  Net -730.15 ml      04/14/2023    7:38 AM 04/02/2023    8:43 AM 12/08/2022    8:09 AM  Last 3 Weights  Weight (lbs) 160 lb 162 lb 12.8 oz 168 lb  Weight (kg) 72.576 kg 73.846 kg 76.204 kg      Telemetry    Afib with rates in the 80-90s, 120s with minimal activity - Personally Reviewed  ECG    No new tracings - Personally Reviewed  Physical Exam   GEN: No acute distress.   Neck: No JVD Cardiac: irregular rhythm, regular rate.  Respiratory: Clear to auscultation bilaterally. GI: Soft, nontender, non-distended  MS: No edema; No deformity. Neuro:  Nonfocal  Psych: Normal affect   Labs    High Sensitivity Troponin:   Recent Labs  Lab 04/14/23 0819 04/14/23 1001  TROPONINIHS 8 8     Chemistry Recent Labs  Lab 04/14/23 0819 04/14/23 1001 04/15/23 0350  NA 140  --  141  K 3.5   --  3.9  CL 107  --  108  CO2 22  --  26  GLUCOSE 172*  --  94  BUN 12  --  13  CREATININE 0.92  --  0.89  CALCIUM 9.3  --  9.0  MG  --  2.2  --   PROT 7.0  --  6.5  ALBUMIN 3.8  --  3.6  AST 29  --  23  ALT 26  --  24  ALKPHOS 49  --  46  BILITOT 0.7  --  0.7  GFRNONAA >60  --  >60  ANIONGAP 11  --  7    Lipids No results for input(s): "CHOL", "TRIG", "HDL", "LABVLDL", "LDLCALC", "CHOLHDL" in the last 168 hours.  Hematology Recent Labs  Lab 04/14/23 0819 04/15/23 0350  WBC 9.1 10.3  RBC 5.02 4.77  HGB 14.3 13.6  HCT 44.7 42.9  MCV 89.0 89.9  MCH 28.5 28.5  MCHC 32.0 31.7  RDW 12.4 12.4  PLT 190 176   Thyroid  Recent Labs  Lab 04/14/23 0901  TSH 0.244*    BNPNo results for input(s): "  BNP", "PROBNP" in the last 168 hours.  DDimer No results for input(s): "DDIMER" in the last 168 hours.   Radiology    ECHOCARDIOGRAM COMPLETE  Result Date: 04/14/2023    ECHOCARDIOGRAM REPORT   Patient Name:   Tina Dalton Date of Exam: 04/14/2023 Medical Rec #:  [id number]   Height:       70.0 in Accession #:    [id number]  Weight:       160.0 lb Date of Birth:  10/20/1950   BSA:          1.898 m Patient Age:    71 years    BP:           119/55 mmHg Patient Gender: F           HR:           78 bpm. Exam Location:  Inpatient Procedure: 2D Echo, Cardiac Doppler and Color Doppler Indications:    I48.91* Unspeicified atrial fibrillation  History:        Patient has prior history of Echocardiogram examinations, most                 recent 07/15/2022. Arrythmias:Atrial Fibrillation; Risk                 Factors:Dyslipidemia.  Sonographer:    Harriette Bouillon RDCS Referring Phys: [phone] DAVID MANUEL ORTIZ IMPRESSIONS  1. Left ventricular ejection fraction, by estimation, is 55 to 60%. The left ventricle has normal function. The left ventricle has no regional wall motion abnormalities. Left ventricular diastolic function could not be evaluated.  2. Right ventricular systolic function is normal. The  right ventricular size is normal.  3. The mitral valve is grossly normal. Trivial mitral valve regurgitation. No evidence of mitral stenosis.  4. The aortic valve is grossly normal. There is mild calcification of the aortic valve. There is mild thickening of the aortic valve. Aortic valve regurgitation is trivial. Comparison(s): No significant change from prior study. FINDINGS  Left Ventricle: Left ventricular ejection fraction, by estimation, is 55 to 60%. The left ventricle has normal function. The left ventricle has no regional wall motion abnormalities. The left ventricular internal cavity size was normal in size. There is  no left ventricular hypertrophy. Left ventricular diastolic function could not be evaluated due to atrial fibrillation. Left ventricular diastolic function could not be evaluated. Right Ventricle: The right ventricular size is normal. Right vetricular wall thickness was not well visualized. Right ventricular systolic function is normal. Left Atrium: Left atrial size was normal in size. Right Atrium: Right atrial size was normal in size. Pericardium: There is no evidence of pericardial effusion. Mitral Valve: The mitral valve is grossly normal. Trivial mitral valve regurgitation. No evidence of mitral valve stenosis. Tricuspid Valve: The tricuspid valve is grossly normal. Tricuspid valve regurgitation is trivial. No evidence of tricuspid stenosis. Aortic Valve: The aortic valve is grossly normal. There is mild calcification of the aortic valve. There is mild thickening of the aortic valve. Aortic valve regurgitation is trivial. Pulmonic Valve: The pulmonic valve was not well visualized. Pulmonic valve regurgitation is not visualized. Aorta: The aortic root is normal in size and structure. Venous: The inferior vena cava was not well visualized. IAS/Shunts: The interatrial septum was not well visualized.  LEFT VENTRICLE PLAX 2D LVIDd:         4.40 cm   Diastology LVIDs:         3.30 cm   LV e'  lateral:  18.50 cm/s LV PW:         0.80 cm   LV E/e' lateral: 4.4 LV IVS:        0.80 cm LVOT diam:     2.00 cm LV SV:         56 LV SV Index:   29 LVOT Area:     3.14 cm  RIGHT VENTRICLE RV S prime:     9.79 cm/s TAPSE (M-mode): 1.8 cm LEFT ATRIUM             Index        RIGHT ATRIUM           Index LA diam:        2.90 cm 1.53 cm/m   RA Area:     13.40 cm LA Vol (A2C):   32.8 ml 17.28 ml/m  RA Volume:   29.20 ml  15.38 ml/m LA Vol (A4C):   33.7 ml 17.75 ml/m LA Biplane Vol: 33.3 ml 17.54 ml/m  AORTIC VALVE LVOT Vmax:   89.40 cm/s LVOT Vmean:  63.000 cm/s LVOT VTI:    0.178 m  AORTA Ao Root diam: 3.00 cm MITRAL VALVE MV Area (PHT): 3.46 cm    SHUNTS MV Decel Time: 219 msec    Systemic VTI:  0.18 m MV E velocity: 82.00 cm/s  Systemic Diam: 2.00 cm Jodelle Red MD Electronically signed by Jodelle Red MD Signature Date/Time: 04/14/2023/9:35:54 PM    Final    CT Head Wo Contrast  Result Date: 04/14/2023 CLINICAL DATA:  Syncopal episode, fall EXAM: CT HEAD WITHOUT CONTRAST TECHNIQUE: Contiguous axial images were obtained from the base of the skull through the vertex without intravenous contrast. RADIATION DOSE REDUCTION: This exam was performed according to the departmental dose-optimization program which includes automated exposure control, adjustment of the mA and/or kV according to patient size and/or use of iterative reconstruction technique. COMPARISON:  12/01/2020 FINDINGS: Brain: No evidence of acute infarction, hemorrhage, hydrocephalus, extra-axial collection or mass lesion/mass effect. Vascular: No hyperdense vessel or unexpected calcification. Skull: Normal. Negative for fracture or focal lesion. Sinuses/Orbits: No acute finding. Other: None. IMPRESSION: No acute intracranial abnormality by noncontrast CT. Electronically Signed   By: Judie Petit.  Shick M.D.   On: 04/14/2023 08:43   DG Chest Port 1 View  Result Date: 04/14/2023 CLINICAL DATA:  palpitations EXAM: PORTABLE CHEST 1  VIEW COMPARISON:  12/01/2020. FINDINGS: Bilateral lung fields are clear. Bilateral costophrenic angles are clear. Normal cardio-mediastinal silhouette. No acute osseous abnormalities. The soft tissues are within normal limits. IMPRESSION: No active disease. Electronically Signed   By: Jules Schick M.D.   On: 04/14/2023 08:21    Cardiac Studies   Echo 04/14/23:  1. Left ventricular ejection fraction, by estimation, is 55 to 60%. The  left ventricle has normal function. The left ventricle has no regional  wall motion abnormalities. Left ventricular diastolic function could not  be evaluated.   2. Right ventricular systolic function is normal. The right ventricular  size is normal.   3. The mitral valve is grossly normal. Trivial mitral valve  regurgitation. No evidence of mitral stenosis.   4. The aortic valve is grossly normal. There is mild calcification of the  aortic valve. There is mild thickening of the aortic valve. Aortic valve  regurgitation is trivial.   Patient Profile     72 y.o. female with a hx of palpitations, PSVT/PVCs, HLD, elevated coronary calcium score, and hypothyroidism who is being seen for the evaluation of  atrial fibrillation with RVR   Assessment & Plan    Atrial fibrillation with RVR - New diagnosis this admission - Palpitations last several days - Rate controlled with IV Cardizem currently running at 5 mg/h - Telemetry reviewed and she remains in rate controlled A-fib in the 80s at rest but RVR in the 120s with activity  Initial discussions with the patient and her daughter included possible TEE-DCCV tomorrow versus discharging home on p.o. Cardizem with plans for outpatient cardioversion.  However, it appears that her heart rate elevates to at least the 120s with minimal movement. No room to increase cardizem dose with BP.  Given RVR with minimal activity in addition to already having a syncopal episode and now on Eliquis, I would lean more towards completing  TEE-DCCV while inpatient.  Awaiting to hear from the OR scheduling/Cath Lab for availability tomorrow.  Possible slot for today, will keep n.p.o. for now   Need for chronic anticoagulation This patients CHA2DS2-VASc Score and unadjusted Ischemic Stroke Rate (% per year) is equal to 3.2 % stroke rate/year from a score of 4 (age, female, CAD) Has already started eliquis - continue   Syncope -Episode while showering yesterday morning after several days of palpitations - Thankfully no head injury - Suspect etiology of her syncope is likely RVR with borderline BP, but will need to abide by GI restrictions for 6 months per [place] DMV   Elevated coronary calcium score Noted in 06/2022 Will continue to focus on risk factor modification, no chest pain - HS troponin x 2 negative -If antiarrhythmic needed will likely need a formal ischemic evaluation but can hold off for now   Hyperlipidemia with LDL goal less than 70 04/02/2023: Cholesterol 139; HDL 58.40; LDL Cholesterol 66; Triglycerides 73.0; VLDL 14.6 Continue 40 mg crestor and 10 mg zetia   Hypothyroidism - appreciate primary helping with synthroid dosing      For questions or updates, please contact [facility] Please consult [email or web address] for contact info under        Signed, Marcelino Duster, PA  04/15/2023, 9:52 AM    Personally seen and examined. Agree with above.  A-fib/syncope/relatively low blood pressure.  Continues to have some rapid ventricular response when moving.  Was on low-dose IV diltiazem drip and low-dose Toprol.  I tried to switch her earlier this morning to Cardizem CD120.  Looks like she is still going somewhat fast.  Angie was able to get her set up for [facility] transfer for TEE cardioversion at [facility].  Excellent.  She has been on Eliquis.  Spoke with her daughter as well.  Currently feels well.  Donato Schultz, MD

## 2023-04-15 NOTE — Care Management Obs Status (Signed)
MEDICARE OBSERVATION STATUS NOTIFICATION   Patient Details  Name: Tina Dalton MRN: [id number] Date of Birth: [birth year]   Medicare Observation Status Notification Given:  Yes    Larrie Kass, LCSW 04/15/2023, 9:34 AM

## 2023-04-15 NOTE — Progress Notes (Signed)
Transition of Care [facility]) - Inpatient Brief Assessment   Patient Details  Name: Tina Dalton MRN: [id number] Date of Birth: 1950/12/03  Transition of Care [facility]) CM/SW Contact:    Larrie Kass, LCSW Phone Number: 04/15/2023, 9:37 AM   Clinical Narrative:  Transition of Care Department [facility]) has reviewed patient and no TOC needs have been identified at this time. We will continue to monitor patient advancement through interdisciplinary progression rounds. If new patient transition needs arise, please place a TOC consult.  Transition of Care Asessment: Insurance and Status: Insurance coverage has been reviewed Patient has primary care physician: Yes Home environment has been reviewed: yes Prior level of function:: independent Prior/Current Home Services: No current home services Social Determinants of Health Reivew: SDOH reviewed no interventions necessary Readmission risk has been reviewed: Yes Transition of care needs: no transition of care needs at this time

## 2023-04-15 NOTE — Interval H&P Note (Signed)
History and Physical Interval Note:  04/15/2023 12:33 PM  Tina Dalton  has presented today for surgery, with the diagnosis of afib.  The various methods of treatment have been discussed with the patient and family. After consideration of risks, benefits and other options for treatment, the patient has consented to  Procedure(s): TRANSESOPHAGEAL ECHOCARDIOGRAM (N/A) CARDIOVERSION (N/A) as a surgical intervention.  The patient's history has been reviewed, patient examined, no change in status, stable for surgery.  I have reviewed the patient's chart and labs.  Questions were answered to the patient's satisfaction.     Meriam Sprague

## 2023-04-15 NOTE — Progress Notes (Signed)
  Echocardiogram Echocardiogram Transesophageal has been performed.  Janalyn Harder 04/15/2023, 3:27 PM

## 2023-04-15 NOTE — Discharge Summary (Signed)
Physician Discharge Summary  Trenity Pha XBM:[id number] DOB: [birth year] DOA: 04/14/2023  PCP: Willow Ora, MD  Admit date: 04/14/2023 Discharge date: 04/15/2023  Admitted From: Home Discharge disposition: Home  Recommendations at discharge:  You have been started on Cardizem 120 mg to be taken in the morning.  Continue Toprol 25 mg at night as before. You been started on Eliquis which is a blood thinner.  Please watch out for any obvious bleeding, black stool or easy bruisability. The dose of your levothyroxine has been reduced to 88 mcg daily.  Please follow-up with primary care provider for repeat TSH level in next few weeks.   Brief narrative: Elena Davia is a 72 y.o. female with PMH significant for h/o palpitations, PSVT/PVCs, HLD, hypothyroidism, osteoarthritis s/p b/l knee replacement. 7/24, patient presented to the ED after an episode of syncope in the shower.  Per report, patient has been taking care of her sister lately and has not been taking care of herself lately.  She has had palpitations for the last several days.  On 7/24, while in the shower, she felt lightheaded and immediately passed out. Unknown downtime.  In the ED, afebrile, heart rate 123, blood pressure in 90s and low 100s, breathing on room air EKG with A-fib with RVR with heart rates in 130s Labs mostly unremarkable including negative troponin Urinalysis with hazy yellow urine, small hemoglobin, large leukocytes, many bacteria Chest x-ray unremarkable CT unremarkable  Patient was started on Cardizem drip Admitted to [facility] Cardiology consulted  Subjective: Patient was seen and examined this morning.  Cardiology team was at bedside.  Patient was in rate controlled A-fib.  Daughter at bedside. This afternoon, she went to [facility] and got successful cardioversion with conversion to normal sinus rhythm. Seen and examined again this afternoon.  Feels good.  Wants to go home.  Assessment and plan: Atrial  fibrillation with RVR Presented with palpitations for several days. EKG in the ED showed A-fib with RVR.  She has history of PSVTs/PVCs in the past. Initially started on Cardizem drip. Cardiology consult obtained. Echo with EF 55 to 60%, no wall motion commonality, right ventricle systolic function and size normal, trivial aortic regurg  Per my discussion with cardiologist Dr. Anne Fu this afternoon, will discharge patient on Cardizem 120 mg to be taken in the morning.  Continue Toprol 25 mg at night as before. CHADSVASC score of 3 -started on Eliquis. Follow-up with cardiology in the office.  Syncope Likely secondary to RVR with borderline low blood pressure  HLD Coronary artery calcification Continue Crestor, Zetia, metoprolol  Hypothyroidism Trend as below.  Thyroxine dose was reduced from 125 mcg to 100 mcg few weeks ago.  TSH level still remains suppressed.  I would further reduce the dose of thyroxine to 88 MCG daily New prescription sent to the pharmacy. Recent Labs    04/02/23 0916 04/14/23 0901  TSH 0.08* 0.244*   Wounds:  -    Discharge Exam:   Vitals:   04/15/23 1530 04/15/23 1535 04/15/23 1545 04/15/23 1652  BP:  111/79 116/64 127/75  Pulse: 78 79 66 70  Resp:    17  Temp:    98.2 F (36.8 C)  TempSrc:      SpO2: 100% 100% 100% 100%  Weight:      Height:        Body mass index is 24.39 kg/m.  General exam: Pleasant, not in distress Skin: No rashes, lesions or ulcers. HEENT: Atraumatic, normocephalic, no obvious bleeding Lungs:  Clear to auscultation bilaterally CVS: Regular rate and rhythm this afternoon, no murmur GI/Abd soft, nontender, nondistended, bowel sound present CNS: Alert, awake, oriented x 3 Psychiatry: Mood appropriate Extremities: No pedal edema, no calf tenderness  Follow ups:    Follow-up Information     Willow Ora, MD Follow up.   Specialty: Family Medicine Contact information: [address] [place]  [address] [phone]                 Discharge Instructions:   Discharge Instructions     Call MD for:  difficulty breathing, headache or visual disturbances   Complete by: As directed    Call MD for:  extreme fatigue   Complete by: As directed    Call MD for:  hives   Complete by: As directed    Call MD for:  persistant dizziness or light-headedness   Complete by: As directed    Call MD for:  persistant nausea and vomiting   Complete by: As directed    Call MD for:  severe uncontrolled pain   Complete by: As directed    Call MD for:  temperature >100.4   Complete by: As directed    Diet general   Complete by: As directed    Discharge instructions   Complete by: As directed    Recommendations at discharge:   You have been started on Cardizem 120 mg to be taken in the morning.  Continue Toprol 25 mg at night as before.  You been started on Eliquis which is a blood thinner.  Please watch out for any obvious bleeding, black stool or easy bruisability.  The dose of your levothyroxine has been reduced to 88 mcg daily.  Please follow-up with primary care provider for repeat TSH level in next few weeks.  General discharge instructions: Follow with Primary MD Willow Ora, MD in 7 days  Please request your PCP  to go over your hospital tests, procedures, radiology results at the follow up. Please get your medicines reviewed and adjusted.  Your PCP may decide to repeat certain labs or tests as needed. Do not drive, operate heavy machinery, perform activities at heights, swimming or participation in water activities or provide baby sitting services if your were admitted for syncope or siezures until you have seen by Primary MD or a Neurologist and advised to do so again. [place] Controlled Substance Reporting System database was reviewed. Do not drive, operate heavy machinery, perform activities at heights, swim, participate in water activities or provide baby-sitting  services while on medications for pain, sleep and mood until your outpatient physician has reevaluated you and advised to do so again.  You are strongly recommended to comply with the dose, frequency and duration of prescribed medications. Activity: As tolerated with Full fall precautions use walker/cane & assistance as needed Avoid using any recreational substances like cigarette, tobacco, alcohol, or non-prescribed drug. If you experience worsening of your admission symptoms, develop shortness of breath, life threatening emergency, suicidal or homicidal thoughts you must seek medical attention immediately by calling 911 or calling your MD immediately  if symptoms less severe. You must read complete instructions/literature along with all the possible adverse reactions/side effects for all the medicines you take and that have been prescribed to you. Take any new medicine only after you have completely understood and accepted all the possible adverse reactions/side effects.  Wear Seat belts while driving. You were cared for by a hospitalist during your hospital stay.  If you have any questions about your discharge medications or the care you received while you were in the hospital after you are discharged, you can call the unit and ask to speak with the hospitalist or the covering physician. Once you are discharged, your primary care physician will handle any further medical issues. Please note that NO REFILLS for any discharge medications will be authorized once you are discharged, as it is imperative that you return to your primary care physician (or establish a relationship with a primary care physician if you do not have one).   Increase activity slowly   Complete by: As directed        Discharge Medications:   Allergies as of 04/15/2023   No Known Allergies      Medication List     TAKE these medications    apixaban 5 MG Tabs tablet Commonly known as: ELIQUIS Take 1 tablet (5 mg total)  by mouth 2 (two) times daily.   diltiazem 120 MG 24 hr capsule Commonly known as: Cardizem CD Take 1 capsule (120 mg total) by mouth daily.   ezetimibe 10 MG tablet Commonly known as: ZETIA Take 1 tablet (10 mg total) by mouth daily.   levothyroxine 88 MCG tablet Commonly known as: Synthroid Take 1 tablet (88 mcg total) by mouth daily before breakfast. What changed:  medication strength how much to take when to take this   metoprolol succinate 25 MG 24 hr tablet Commonly known as: TOPROL-XL Take 1 tablet (25 mg total) by mouth at bedtime.   rosuvastatin 40 MG tablet Commonly known as: CRESTOR Take 1 tablet (40 mg total) by mouth daily.         The results of significant diagnostics from this hospitalization (including imaging, microbiology, ancillary and laboratory) are listed below for reference.    Procedures and Diagnostic Studies:   ECHO TEE  Result Date: 04/15/2023    TRANSESOPHOGEAL ECHO REPORT   Patient Name:   MARGARETHE VIRGEN Date of Exam: 04/15/2023 Medical Rec #:  [id number]   Height:       70.0 in Accession #:    [id number]  Weight:       170.0 lb Date of Birth:  July 30, 1951   BSA:          1.948 m Patient Age:    71 years    BP:           112/76 mmHg Patient Gender: F           HR:           120 bpm. Exam Location:  Inpatient Procedure: Transesophageal Echo, Cardiac Doppler and Color Doppler Indications:     I48.91* Unspeicified atrial fibrillation  History:         Patient has prior history of Echocardiogram examinations, most                  recent 04/14/2023. Abnormal ECG, Arrythmias:Atrial Fibrillation,                  Signs/Symptoms:Murmur; Risk Factors:Hypertension.  Sonographer:     Sheralyn Boatman RDCS Referring Phys:  [id number] Kathlynn Grate [place] Diagnosing Phys: Laurance Flatten MD PROCEDURE: After discussion of the risks and benefits of a TEE, an informed consent was obtained from the patient. The transesophogeal probe was passed without difficulty through the  esophogus of the patient. Imaged were obtained with the patient in a left lateral decubitus position. Sedation performed by different physician. The patient was  monitored while under deep sedation. Anesthestetic sedation was provided intravenously by Anesthesiology: 194mg  of Propofol, 80mg  of Lidocaine. The patient's vital signs; including heart rate, blood pressure, and oxygen saturation; remained stable throughout the procedure. The patient developed no complications during the procedure. A successful direct current cardioversion was performed at 120 joules with 1 attempt.  IMPRESSIONS  1. Left ventricular ejection fraction, by estimation, is 55 to 60%. The left ventricle has normal function.  2. Right ventricular systolic function is normal. The right ventricular size is normal.  3. No left atrial/left atrial appendage thrombus was detected. The LAA emptying velocity was 75 cm/s.  4. The mitral valve is normal in structure. Trivial mitral valve regurgitation.  5. The aortic valve is tricuspid. Aortic valve regurgitation is trivial. No aortic stenosis is present.  6. Following TEE, the patient underwent successful DCCV with 150J x1. FINDINGS  Left Ventricle: Left ventricular ejection fraction, by estimation, is 55 to 60%. The left ventricle has normal function. The left ventricular internal cavity size was normal in size. Right Ventricle: The right ventricular size is normal. No increase in right ventricular wall thickness. Right ventricular systolic function is normal. Left Atrium: Left atrial size was normal in size. No left atrial/left atrial appendage thrombus was detected. The LAA emptying velocity was 75 cm/s. Right Atrium: Right atrial size was normal in size. Pericardium: There is no evidence of pericardial effusion. Mitral Valve: The mitral valve is normal in structure. Trivial mitral valve regurgitation. Tricuspid Valve: The tricuspid valve is normal in structure. Tricuspid valve regurgitation is not  demonstrated. Aortic Valve: The aortic valve is tricuspid. Aortic valve regurgitation is trivial. No aortic stenosis is present. Pulmonic Valve: The pulmonic valve was normal in structure. Pulmonic valve regurgitation is trivial. No evidence of pulmonic stenosis. Aorta: The aortic root and ascending aorta are structurally normal, with no evidence of dilitation. IAS/Shunts: No atrial level shunt detected by color flow Doppler. Additional Comments: Spectral Doppler performed. LEFT VENTRICLE PLAX 2D LVOT diam:     2.10 cm LVOT Area:     3.46 cm   AORTA Ao Asc diam: 3.30 cm  SHUNTS Systemic Diam: 2.10 cm Laurance Flatten MD Electronically signed by Laurance Flatten MD Signature Date/Time: 04/15/2023/4:21:41 PM    Final    EP STUDY  Result Date: 04/15/2023 See surgical note for result.  ECHOCARDIOGRAM COMPLETE  Result Date: 04/14/2023    ECHOCARDIOGRAM REPORT   Patient Name:   LAMYIAH CRAWSHAW Date of Exam: 04/14/2023 Medical Rec #:  [id number]   Height:       70.0 in Accession #:    [id number]  Weight:       160.0 lb Date of Birth:  [birth year]   BSA:          1.898 m Patient Age:    71 years    BP:           119/55 mmHg Patient Gender: F           HR:           78 bpm. Exam Location:  Inpatient Procedure: 2D Echo, Cardiac Doppler and Color Doppler Indications:    I48.91* Unspeicified atrial fibrillation  History:        Patient has prior history of Echocardiogram examinations, most                 recent 07/15/2022. Arrythmias:Atrial Fibrillation; Risk                 Factors:Dyslipidemia.  Sonographer:    Harriette Bouillon RDCS Referring Phys: [phone] DAVID MANUEL ORTIZ IMPRESSIONS  1. Left ventricular ejection fraction, by estimation, is 55 to 60%. The left ventricle has normal function. The left ventricle has no regional wall motion abnormalities. Left ventricular diastolic function could not be evaluated.  2. Right ventricular systolic function is normal. The right ventricular size is normal.  3. The mitral valve is  grossly normal. Trivial mitral valve regurgitation. No evidence of mitral stenosis.  4. The aortic valve is grossly normal. There is mild calcification of the aortic valve. There is mild thickening of the aortic valve. Aortic valve regurgitation is trivial. Comparison(s): No significant change from prior study. FINDINGS  Left Ventricle: Left ventricular ejection fraction, by estimation, is 55 to 60%. The left ventricle has normal function. The left ventricle has no regional wall motion abnormalities. The left ventricular internal cavity size was normal in size. There is  no left ventricular hypertrophy. Left ventricular diastolic function could not be evaluated due to atrial fibrillation. Left ventricular diastolic function could not be evaluated. Right Ventricle: The right ventricular size is normal. Right vetricular wall thickness was not well visualized. Right ventricular systolic function is normal. Left Atrium: Left atrial size was normal in size. Right Atrium: Right atrial size was normal in size. Pericardium: There is no evidence of pericardial effusion. Mitral Valve: The mitral valve is grossly normal. Trivial mitral valve regurgitation. No evidence of mitral valve stenosis. Tricuspid Valve: The tricuspid valve is grossly normal. Tricuspid valve regurgitation is trivial. No evidence of tricuspid stenosis. Aortic Valve: The aortic valve is grossly normal. There is mild calcification of the aortic valve. There is mild thickening of the aortic valve. Aortic valve regurgitation is trivial. Pulmonic Valve: The pulmonic valve was not well visualized. Pulmonic valve regurgitation is not visualized. Aorta: The aortic root is normal in size and structure. Venous: The inferior vena cava was not well visualized. IAS/Shunts: The interatrial septum was not well visualized.  LEFT VENTRICLE PLAX 2D LVIDd:         4.40 cm   Diastology LVIDs:         3.30 cm   LV e' lateral:   18.50 cm/s LV PW:         0.80 cm   LV E/e'  lateral: 4.4 LV IVS:        0.80 cm LVOT diam:     2.00 cm LV SV:         56 LV SV Index:   29 LVOT Area:     3.14 cm  RIGHT VENTRICLE RV S prime:     9.79 cm/s TAPSE (M-mode): 1.8 cm LEFT ATRIUM             Index        RIGHT ATRIUM           Index LA diam:        2.90 cm 1.53 cm/m   RA Area:     13.40 cm LA Vol (A2C):   32.8 ml 17.28 ml/m  RA Volume:   29.20 ml  15.38 ml/m LA Vol (A4C):   33.7 ml 17.75 ml/m LA Biplane Vol: 33.3 ml 17.54 ml/m  AORTIC VALVE LVOT Vmax:   89.40 cm/s LVOT Vmean:  63.000 cm/s LVOT VTI:    0.178 m  AORTA Ao Root diam: 3.00 cm MITRAL VALVE MV Area (PHT): 3.46 cm    SHUNTS MV Decel Time: 219 msec    Systemic VTI:  0.18  m MV E velocity: 82.00 cm/s  Systemic Diam: 2.00 cm Jodelle Red MD Electronically signed by Jodelle Red MD Signature Date/Time: 04/14/2023/9:35:54 PM    Final    CT Head Wo Contrast  Result Date: 04/14/2023 CLINICAL DATA:  Syncopal episode, fall EXAM: CT HEAD WITHOUT CONTRAST TECHNIQUE: Contiguous axial images were obtained from the base of the skull through the vertex without intravenous contrast. RADIATION DOSE REDUCTION: This exam was performed according to the departmental dose-optimization program which includes automated exposure control, adjustment of the mA and/or kV according to patient size and/or use of iterative reconstruction technique. COMPARISON:  12/01/2020 FINDINGS: Brain: No evidence of acute infarction, hemorrhage, hydrocephalus, extra-axial collection or mass lesion/mass effect. Vascular: No hyperdense vessel or unexpected calcification. Skull: Normal. Negative for fracture or focal lesion. Sinuses/Orbits: No acute finding. Other: None. IMPRESSION: No acute intracranial abnormality by noncontrast CT. Electronically Signed   By: Judie Petit.  Shick M.D.   On: 04/14/2023 08:43   DG Chest Port 1 View  Result Date: 04/14/2023 CLINICAL DATA:  palpitations EXAM: PORTABLE CHEST 1 VIEW COMPARISON:  12/01/2020. FINDINGS: Bilateral lung  fields are clear. Bilateral costophrenic angles are clear. Normal cardio-mediastinal silhouette. No acute osseous abnormalities. The soft tissues are within normal limits. IMPRESSION: No active disease. Electronically Signed   By: Jules Schick M.D.   On: 04/14/2023 08:21     Labs:   Basic Metabolic Panel: Recent Labs  Lab 04/14/23 0819 04/14/23 1001 04/15/23 0350  NA 140  --  141  K 3.5  --  3.9  CL 107  --  108  CO2 22  --  26  GLUCOSE 172*  --  94  BUN 12  --  13  CREATININE 0.92  --  0.89  CALCIUM 9.3  --  9.0  MG  --  2.2  --   PHOS  --  2.7  --    GFR Estimated Creatinine Clearance: 64.5 mL/min (by C-G formula based on SCr of 0.89 mg/dL). Liver Function Tests: Recent Labs  Lab 04/14/23 0819 04/15/23 0350  AST 29 23  ALT 26 24  ALKPHOS 49 46  BILITOT 0.7 0.7  PROT 7.0 6.5  ALBUMIN 3.8 3.6   No results for input(s): "LIPASE", "AMYLASE" in the last 168 hours. No results for input(s): "AMMONIA" in the last 168 hours. Coagulation profile No results for input(s): "INR", "PROTIME" in the last 168 hours.  CBC: Recent Labs  Lab 04/14/23 0819 04/15/23 0350  WBC 9.1 10.3  NEUTROABS 7.4  --   HGB 14.3 13.6  HCT 44.7 42.9  MCV 89.0 89.9  PLT 190 176   Cardiac Enzymes: No results for input(s): "CKTOTAL", "CKMB", "CKMBINDEX", "TROPONINI" in the last 168 hours. BNP: Invalid input(s): "POCBNP" CBG: Recent Labs  Lab 04/15/23 0459  GLUCAP 95   D-Dimer No results for input(s): "DDIMER" in the last 72 hours. Hgb A1c No results for input(s): "HGBA1C" in the last 72 hours. Lipid Profile No results for input(s): "CHOL", "HDL", "LDLCALC", "TRIG", "CHOLHDL", "LDLDIRECT" in the last 72 hours. Thyroid function studies Recent Labs    04/14/23 0901  TSH 0.244*   Anemia work up No results for input(s): "VITAMINB12", "FOLATE", "FERRITIN", "TIBC", "IRON", "RETICCTPCT" in the last 72 hours. Microbiology No results found for this or any previous visit (from the past  240 hour(s)).  Time coordinating discharge: 45 minutes  Signed: Kobi Aller  [facility] 04/15/2023, 4:59 PM

## 2023-04-16 ENCOUNTER — Encounter (HOSPITAL_COMMUNITY): Payer: Self-pay | Admitting: Cardiology

## 2023-04-16 NOTE — Anesthesia Postprocedure Evaluation (Signed)
Anesthesia Post Note  Patient: Tina Dalton  Procedure(s) Performed: TRANSESOPHAGEAL ECHOCARDIOGRAM CARDIOVERSION     Patient location during evaluation: Cath Lab Anesthesia Type: MAC and General Level of consciousness: awake and alert Pain management: pain level controlled Vital Signs Assessment: post-procedure vital signs reviewed and stable Respiratory status: spontaneous breathing, nonlabored ventilation and respiratory function stable Cardiovascular status: blood pressure returned to baseline and stable Postop Assessment: no apparent nausea or vomiting Anesthetic complications: no   No notable events documented.  Last Vitals:  Vitals:   04/15/23 1545 04/15/23 1652  BP: 116/64 127/75  Pulse: 66 70  Resp:  17  Temp:  36.8 C  SpO2: 100% 100%    Last Pain:  Vitals:   04/15/23 1530  TempSrc:   PainSc: 0-No pain                 Adreanne Yono

## 2023-04-19 ENCOUNTER — Other Ambulatory Visit: Payer: Self-pay | Admitting: Family Medicine

## 2023-04-19 ENCOUNTER — Telehealth: Payer: Self-pay

## 2023-04-19 DIAGNOSIS — Z780 Asymptomatic menopausal state: Secondary | ICD-10-CM

## 2023-04-19 DIAGNOSIS — M8580 Other specified disorders of bone density and structure, unspecified site: Secondary | ICD-10-CM

## 2023-04-19 NOTE — Transitions of Care (Post Inpatient/ED Visit) (Signed)
   04/19/2023  Name: Tina Dalton MRN: [id number] DOB: 21-Nov-1950  Today's TOC FU Call Status: Today's TOC FU Call Status:: Successful TOC FU Call Competed TOC FU Call Complete Date: 04/19/23  Transition Care Management Follow-up Telephone Call Date of Discharge: 04/15/23 Discharge Facility: [facility]) Type of Discharge: Inpatient Admission Primary Inpatient Discharge Diagnosis:: Atrial fibrillation with RVR How have you been since you were released from the hospital?: Better Any questions or concerns?: No  Items Reviewed: Did you receive and understand the discharge instructions provided?: Yes Medications obtained,verified, and reconciled?: Yes (Medications Reviewed) Any new allergies since your discharge?: No Dietary orders reviewed?: Yes Do you have support at home?: Yes  Medications Reviewed Today: Medications Reviewed Today     Reviewed by Merleen Nicely, LPN (Licensed Practical Nurse) on 04/19/23 at 1009  Med List Status: <None>   Medication Order Taking? Sig Documenting Provider Last Dose Status Informant  apixaban (ELIQUIS) 5 MG TABS tablet [id number] Yes Take 1 tablet (5 mg total) by mouth 2 (two) times daily. Lorin Glass, MD Taking Active   diltiazem (CARDIZEM CD) 120 MG 24 hr capsule [id number] Yes Take 1 capsule (120 mg total) by mouth daily. Lorin Glass, MD Taking Active   ezetimibe (ZETIA) 10 MG tablet [id number] Yes Take 1 tablet (10 mg total) by mouth daily. Lewayne Bunting, MD Taking Active Self, Pharmacy Records  levothyroxine (SYNTHROID) 88 MCG tablet [id number] Yes Take 1 tablet (88 mcg total) by mouth daily before breakfast. Lorin Glass, MD Taking Active   metoprolol succinate (TOPROL-XL) 25 MG 24 hr tablet [id number] Yes Take 1 tablet (25 mg total) by mouth at bedtime. Lewayne Bunting, MD Taking Active Self, Pharmacy Records  rosuvastatin (CRESTOR) 40 MG tablet [id number] Yes Take 1 tablet (40 mg total) by mouth daily. Lewayne Bunting, MD Taking  Active Self, Pharmacy Records            Home Care and Equipment/Supplies: Were Home Health Services Ordered?: No Any new equipment or medical supplies ordered?: No  Functional Questionnaire: Do you need assistance with bathing/showering or dressing?: No Do you need assistance with meal preparation?: No Do you need assistance with eating?: No Do you have difficulty maintaining continence: No Do you need assistance with getting out of bed/getting out of a chair/moving?: No Do you have difficulty managing or taking your medications?: No  Follow up appointments reviewed: PCP Follow-up appointment confirmed?: Yes Date of PCP follow-up appointment?: 04/22/23 Follow-up Provider: [facility] Follow-up appointment confirmed?: No Do you need transportation to your follow-up appointment?: No Do you understand care options if your condition(s) worsen?: Yes-patient verbalized understanding    SIGNATURE  Woodfin Ganja LPN [facility] Nurse Health Advisor Direct Dial [phone]

## 2023-04-20 ENCOUNTER — Ambulatory Visit
Admission: RE | Admit: 2023-04-20 | Discharge: 2023-04-20 | Disposition: A | Discharge status: Home / Self Care | Payer: Medicare Other | Source: Ambulatory Visit | Attending: Family Medicine | Admitting: Family Medicine

## 2023-04-20 DIAGNOSIS — Z1231 Encounter for screening mammogram for malignant neoplasm of breast: Secondary | ICD-10-CM | POA: Diagnosis not present

## 2023-04-22 ENCOUNTER — Ambulatory Visit (INDEPENDENT_AMBULATORY_CARE_PROVIDER_SITE_OTHER): Payer: Medicare Other | Admitting: Family Medicine

## 2023-04-22 ENCOUNTER — Encounter: Payer: Self-pay | Admitting: Family Medicine

## 2023-04-22 VITALS — BP 108/60 | HR 60 | Temp 98.2°F | Ht 70.0 in | Wt 164.6 lb

## 2023-04-22 DIAGNOSIS — I5189 Other ill-defined heart diseases: Secondary | ICD-10-CM

## 2023-04-22 DIAGNOSIS — I4891 Unspecified atrial fibrillation: Secondary | ICD-10-CM

## 2023-04-22 DIAGNOSIS — R011 Cardiac murmur, unspecified: Secondary | ICD-10-CM | POA: Diagnosis not present

## 2023-04-22 DIAGNOSIS — E039 Hypothyroidism, unspecified: Secondary | ICD-10-CM

## 2023-04-22 NOTE — Progress Notes (Deleted)
Cardiology Office Note:    Date:  04/22/2023   ID:  Tina Dalton, DOB 1951-06-29, MRN [id number]  PCP:  Tina Ora, MD   [facility] Providers Cardiologist:  Olga Millers, MD { Click to update primary MD,subspecialty MD or APP then REFRESH:1}    Referring MD: Tina Ora, MD   No chief complaint on file. ***  History of Present Illness:    Tina Dalton is a 72 y.o. 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  Ms. Civello established cardiology care with Dr. Jens Som for palpitations.  Heart monitor 06/2022 showed sinus rhythm with short runs of SVT, PVCs, ventricular triplet, and 8 beats of AIVR.  She had a coronary calcium score 06/2022 that was 34.9 placing her at the 61st percentile.  A follow-up echocardiogram 06/2022 showed a normal LVEF, grade 1 DD, mild AI.  She has been stable on 25 mg Toprol.  Hyperlipidemia has been managed with 40 mg Crestor and 10 mg Zetia   She was hospitalized 04/14/2023 found to have new onset atrial fibrillation with RVR in the setting of TSH 0.244.  She had syncopized earlier that day in the shower.  She was caring for her sister and has not been taking care of herself.  She eventually underwent TEE guided cardioversion with successful restoration of sinus rhythm.  She was discharged on 120 mg Cardizem, milligrams Toprol, 5 mg Eliquis twice daily.  She presents today for follow-up.    Paroxysmal atrial fibrillation TEE-DCCV on 04/15/2023 Low-dose Cardizem and Toprol   Chronic anticoagulation No bleeding issues on 5 mg Eliquis twice daily  Hyperlipidemia On Crestor and Zetia   Hypothyroidism Synthroid dose was further reduced during her hospitalization Per primary/Endo      Past Medical History:  Diagnosis Date   Atrial fibrillation with RVR (HCC)  04/14/2023   Grade I diastolic dysfunction 04/14/2023   Heart murmur, systolic 01/02/2020   History of bilateral knee replacement 01/02/2020   Hypothyroidism    Mixed hyperlipidemia 04/01/2021   ASCVD 8.3% 03/2021; offered statin   Osteoarthritis of knee    Osteopenia after menopause 04/01/2020   DEXA July 2020: Lowest T score equals -1.9    Past Surgical History:  Procedure Laterality Date   CARDIOVERSION N/A 04/15/2023   Procedure: CARDIOVERSION;  Surgeon: Meriam Sprague, MD;  Location: [facility] INVASIVE CV LAB;  Service: Cardiovascular;  Laterality: N/A;   CATARACT EXTRACTION W/ INTRAOCULAR LENS IMPLANT  2022   S/P Knee replacement   03/2015   TEE WITHOUT CARDIOVERSION N/A 04/15/2023   Procedure: TRANSESOPHAGEAL ECHOCARDIOGRAM;  Surgeon: Meriam Sprague, MD;  Location: [facility] INVASIVE CV LAB;  Service: Cardiovascular;  Laterality: N/A;    Current Medications: No outpatient medications have been marked as taking for the 04/29/23 encounter (Appointment) with Marcelino Duster, PA.     Allergies:   Patient has no known allergies.   Social History   Socioeconomic History   Marital status: Divorced    Spouse name: Not on file   Number of children: 1   Years of education: Not on file   Highest education level: Bachelor's degree (e.g., BA, AB, BS)  Occupational History   Not on file  Tobacco Use   Smoking status: Never   Smokeless tobacco: Never  Vaping Use   Vaping status: Never Used  Substance and Sexual Activity   Alcohol use:  Yes    Comment: Rare   Drug use: Never   Sexual activity: Yes  Other Topics Concern   Not on file  Social History Narrative   1 daughter.1 grand dau   Retired Systems analyst   Social Determinants of Corporate investment banker Strain: Low Risk  (04/01/2023)   Overall Financial Resource Strain (CARDIA)    Difficulty of Paying Living Expenses: Not hard at all  Food Insecurity: No Food Insecurity (04/14/2023)   Hunger Vital Sign    Worried  About Running Out of Food in the Last Year: Never true    Ran Out of Food in the Last Year: Never true  Transportation Needs: No Transportation Needs (04/14/2023)   PRAPARE - Administrator, Civil Service (Medical): No    Lack of Transportation (Non-Medical): No  Physical Activity: Inactive (04/01/2023)   Exercise Vital Sign    Days of Exercise per Week: 0 days    Minutes of Exercise per Session: 30 min  Stress: No Stress Concern Present (04/01/2023)   [employer] of Occupational Health - Occupational Stress Questionnaire    Feeling of Stress : Only a little  Social Connections: Socially Isolated (04/01/2023)   Social Connection and Isolation Panel [NHANES]    Frequency of Communication with Friends and Family: Once a week    Frequency of Social Gatherings with Friends and Family: Once a week    Attends Religious Services: Never    Database administrator or Organizations: No    Attends Engineer, structural: Never    Marital Status: Divorced     Family History: The patient's ***family history includes Alzheimer's disease in her mother; Cancer in her father; Coronary artery disease in her father; Heart attack in her father; Hypertension in her father.  ROS:   Please see the history of present illness.    *** All other systems reviewed and are negative.  EKGs/Labs/Other Studies Reviewed:    The following studies were reviewed today:  Echo 04/14/23:  1. Left ventricular ejection fraction, by estimation, is 55 to 60%. The  left ventricle has normal function. The left ventricle has no regional  wall motion abnormalities. Left ventricular diastolic function could not  be evaluated.   2. Right ventricular systolic function is normal. The right ventricular  size is normal.   3. The mitral valve is grossly normal. Trivial mitral valve  regurgitation. No evidence of mitral stenosis.   4. The aortic valve is grossly normal. There is mild calcification of the   aortic valve. There is mild thickening of the aortic valve. Aortic valve  regurgitation is trivial.        Recent Labs: 04/14/2023: Magnesium 2.2; TSH 0.244 04/15/2023: ALT 24; BUN 13; Creatinine, Ser 0.89; Hemoglobin 13.6; Platelets 176; Potassium 3.9; Sodium 141  Recent Lipid Panel    Component Value Date/Time   CHOL 139 04/02/2023 0916   CHOL 167 11/26/2022 0823   TRIG 73.0 04/02/2023 0916   HDL 58.40 04/02/2023 0916   HDL 70 11/26/2022 0823   CHOLHDL 2 04/02/2023 0916   VLDL 14.6 04/02/2023 0916   LDLCALC 66 04/02/2023 0916   LDLCALC 77 11/26/2022 0823   LDLDIRECT 139.0 06/17/2018 1212     Risk Assessment/Calculations:   {Does this patient have ATRIAL FIBRILLATION?:[phone]}            Physical Exam:    VS:  LMP  (LMP Unknown)     Wt Readings from Last 3 Encounters:  04/22/23 164 lb 9.6 oz (74.7 kg)  04/15/23 170 lb (77.1 kg)  04/02/23 162 lb 12.8 oz (73.8 kg)     GEN: *** Well nourished, well developed in no acute distress HEENT: Normal NECK: No JVD; No carotid bruits LYMPHATICS: No lymphadenopathy CARDIAC: ***RRR, no murmurs, rubs, gallops RESPIRATORY:  Clear to auscultation without rales, wheezing or rhonchi  ABDOMEN: Soft, non-tender, non-distended MUSCULOSKELETAL:  No edema; No deformity  SKIN: Warm and dry NEUROLOGIC:  Alert and oriented x 3 PSYCHIATRIC:  Normal affect   ASSESSMENT:    No diagnosis found. PLAN:    In order of problems listed above:  ***      {Are you ordering a CV Procedure (e.g. stress test, cath, DCCV, TEE, etc)?   Press F2        :[id number]}    Medication Adjustments/Labs and Tests Ordered: Current medicines are reviewed at length with the patient today.  Concerns regarding medicines are outlined above.  No orders of the defined types were placed in this encounter.  No orders of the defined types were placed in this encounter.   There are no Patient Instructions on file for this visit.   Signed, Marcelino Duster, PA  04/22/2023 11:14 AM    [facility]

## 2023-04-22 NOTE — Progress Notes (Signed)
Subjective  CC:  Chief Complaint  Patient presents with   Hospitalization Follow-up    Tina Dalton 04-15-23 Dx: Atrial fibrillation with RVR    HPI: Tina Dalton is a 72 y.o. female who presents to the office today to address the problems listed above in the chief complaint. Patient presents for follow-up after hospitalization July 24 to July 25.  I reviewed all notes, cardiology consult, procedures including TEE cardioversion and lab work.  Reviewed admission reports and discharge summaries.  To summarize, patient was emotional and tired after our last visit, several days later had significant palpitations.  And then ended up with a syncopal event in the shower.  Found to be in atrial fibs. She was treated with IV Cardizem, and then had successful cardioversion.  Discharged on Eliquis and Cardizem and has follow-up with cardiology in a few weeks.  She is feeling great.  No longer having palpitations chest pain shortness of breath.  Emotionally she feels well as well.  Fortunately, she and her daughter were able to coordinate care for her sister with her sisters daughters.  Patient is now able to care for herself better. Hypothyroidism: Recently reduced levothyroxine from 112 mcg to 100 mcg daily.  However, hospitalist decrease this further to 88 mcg after only 2 weeks of the decreased dose.  Patient feels fine.  Assessment  1. Atrial fibrillation with RVR (HCC)   2. Acquired hypothyroidism   3. Grade I diastolic dysfunction   4. Heart murmur, systolic      Plan  A-fib status post cardioversion: Remains in normal sinus rhythm.  Continue Eliquis and Cardizem and follow-up with cardiology.  Feeling well.  Blood pressure stable. Hypothyroidism: Will recheck TSH in 4 to 6 weeks.  Can adjust dose depending on results. She is euvolemic Mood is improved  Follow up: Complete physical as scheduled 05/20/2023  No orders of the defined types were placed in this encounter.  No orders of the  defined types were placed in this encounter.     I reviewed the patients updated PMH, FH, and SocHx.    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   diltiazem (CARDIZEM CD) 120 MG 24 hr capsule Take 1 capsule (120 mg total) by mouth daily.   ezetimibe (ZETIA) 10 MG tablet Take 1 tablet (10 mg total) by mouth daily.   levothyroxine (SYNTHROID) 88 MCG tablet Take 1 tablet (88 mcg total) by mouth daily before breakfast.   metoprolol succinate (TOPROL-XL) 25 MG 24 hr tablet Take 1 tablet (25  mg total) by mouth at bedtime.   rosuvastatin (CRESTOR) 40 MG tablet Take 1 tablet (40 mg total) by mouth daily.    Allergies: Patient has No Known Allergies. Family History: Patient family history includes Alzheimer's disease in her mother; Cancer in her father; Coronary artery disease in her father; Heart attack in her father; Hypertension in her father. Social History:  Patient  reports that she has never smoked. She has never used smokeless tobacco. She reports current alcohol use. She reports that she does not use drugs.  Review  of Systems: Constitutional: Negative for fever malaise or anorexia Cardiovascular: negative for chest pain Respiratory: negative for SOB or persistent cough Gastrointestinal: negative for abdominal pain  Objective  Vitals: BP 108/60   Pulse 60   Temp 98.2 F (36.8 C)   Ht 5\' 10"  (1.778 m)   Wt 164 lb 9.6 oz (74.7 kg)   LMP  (LMP Unknown)   SpO2 92%   BMI 23.62 kg/m  General: no acute distress , A&Ox3 Psych: Appears well Cardiovascular:  RRR with 2/6 systolic murmur Respiratory:  Good breath sounds bilaterally, CTAB with normal respiratory effort Skin:  Warm, no rashes No edema Commons side effects, risks, benefits, and alternatives for medications and treatment plan prescribed today were discussed, and the patient expressed understanding of the given instructions. Patient is instructed to call or message via MyChart if he/she has any questions or concerns regarding our treatment plan. No barriers to understanding were identified. We discussed Red Flag symptoms and signs in detail. Patient expressed understanding regarding what to do in case of urgent or emergency type symptoms.  Medication list was reconciled, printed and provided to the patient in AVS. Patient instructions and summary information was reviewed with the patient as documented in the AVS. This note was prepared with assistance of Dragon voice recognition software. Occasional wrong-word or sound-a-like substitutions may have occurred due to the inherent limitations of voice recognition software

## 2023-04-29 ENCOUNTER — Ambulatory Visit: Payer: Medicare Other | Admitting: Physician Assistant

## 2023-05-03 NOTE — Progress Notes (Unsigned)
Cardiology Office Note:    Date:  05/05/2023   ID:  Tina Dalton, DOB 1950/12/12, MRN [id number]  PCP:  Tina Ora, MD   [facility] Providers Cardiologist:  Tina Millers, MD Cardiology APP:  Tina Dalton, [place] { Referring MD: Tina Ora, MD   Chief Complaint  Patient presents with   Follow-up    Following up post cardioversion. Patient is doing good on today. Meds reviewed.     History of Present Illness:    Tina Dalton is a 72 y.o. 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  [name] established cardiology care with Dr. Jens Dalton for palpitations.  Heart monitor 06/2022 showed sinus rhythm with short runs of SVT, PVCs, ventricular triplet, and 8 beats of AIVR.  She had a coronary calcium score 06/2022 that was 34.9 placing her at the 61st percentile.  A follow-up echocardiogram 06/2022 showed a normal LVEF, grade 1 DD, mild AI.  She has been stable on 25 mg Toprol.  Hyperlipidemia has been managed with 40 mg Crestor and 10 mg Zetia   She was hospitalized 04/14/2023 found to have new onset atrial fibrillation with RVR in the setting of TSH 0.244.  She had syncopized earlier that day in the shower.  She was caring for her sister and has not been taking care of herself.  She eventually underwent TEE guided cardioversion with successful restoration of sinus rhythm.  She was discharged on 120 mg Cardizem, milligrams Toprol, 5 mg Eliquis twice daily.  She presents today for follow-up. She feels well, no bleeding issues. Maintaining sinus rhythm. No cardiac complaints today.   Past Medical History:  Diagnosis Date   Atrial fibrillation with RVR (HCC) 04/14/2023   Grade I diastolic dysfunction 04/14/2023   Heart murmur, systolic 01/02/2020   History of bilateral knee replacement 01/02/2020    Hypothyroidism    Mixed hyperlipidemia 04/01/2021   ASCVD 8.3% 03/2021; offered statin   Osteoarthritis of knee    Osteopenia after menopause 04/01/2020   DEXA July 2020: Lowest T score equals -1.9    Past Surgical History:  Procedure Laterality Date   CARDIOVERSION N/A 04/15/2023   Procedure: CARDIOVERSION;  Surgeon: Tina Sprague, MD;  Location: [facility] INVASIVE CV LAB;  Service: Cardiovascular;  Laterality: N/A;   CATARACT EXTRACTION W/ INTRAOCULAR LENS IMPLANT  2022   S/P Knee replacement   03/2015   TEE WITHOUT CARDIOVERSION N/A 04/15/2023   Procedure: TRANSESOPHAGEAL ECHOCARDIOGRAM;  Surgeon: Tina Sprague, MD;  Location: [facility] INVASIVE CV LAB;  Service: Cardiovascular;  Laterality: N/A;    Current Medications: Current Meds  Medication Sig   apixaban (ELIQUIS) 5 MG TABS tablet Take 1 tablet (5 mg total) by mouth 2 (two) times daily.   diltiazem (CARDIZEM CD) 120 MG 24 hr capsule Take 1 capsule (120 mg total) by mouth daily.   ezetimibe (ZETIA) 10 MG tablet Take 1 tablet (10 mg total) by mouth daily.   levothyroxine (SYNTHROID) 88 MCG tablet Take 1 tablet (88 mcg total) by mouth daily before breakfast.   metoprolol succinate (TOPROL-XL) 25 MG 24 hr tablet Take 1 tablet (25 mg total) by mouth at bedtime.   rosuvastatin (CRESTOR) 40 MG tablet Take 1 tablet (40 mg total) by mouth daily.     Allergies:   Patient has no known allergies.   Social History   Socioeconomic History  Marital status: Divorced    Spouse name: Not on file   Number of children: 1   Years of education: Not on file   Highest education level: Bachelor's degree (e.g., BA, AB, BS)  Occupational History   Not on file  Tobacco Use   Smoking status: Never   Smokeless tobacco: Never  Vaping Use   Vaping status: Never Used  Substance and Sexual Activity   Alcohol use: Yes    Comment: Rare   Drug use: Never   Sexual activity: Yes  Other Topics Concern   Not on file  Social History Narrative   1  daughter.1 grand dau   Retired Systems analyst   Social Determinants of Corporate investment banker Strain: Low Risk  (04/01/2023)   Overall Financial Resource Strain (CARDIA)    Difficulty of Paying Living Expenses: Not hard at all  Food Insecurity: No Food Insecurity (04/14/2023)   Hunger Vital Sign    Worried About Running Out of Food in the Last Year: Never true    Ran Out of Food in the Last Year: Never true  Transportation Needs: No Transportation Needs (04/14/2023)   PRAPARE - Administrator, Civil Service (Medical): No    Lack of Transportation (Non-Medical): No  Physical Activity: Inactive (04/01/2023)   Exercise Vital Sign    Days of Exercise per Week: 0 days    Minutes of Exercise per Session: 30 min  Stress: No Stress Concern Present (04/01/2023)   [employer] of Occupational Health - Occupational Stress Questionnaire    Feeling of Stress : Only a little  Social Connections: Socially Isolated (04/01/2023)   Social Connection and Isolation Panel [NHANES]    Frequency of Communication with Friends and Family: Once a week    Frequency of Social Gatherings with Friends and Family: Once a week    Attends Religious Services: Never    Database administrator or Organizations: No    Attends Engineer, structural: Never    Marital Status: Divorced     Family History: The patient's family history includes Alzheimer's disease in her mother; Cancer in her father; Coronary artery disease in her father; Heart attack in her father; Hypertension in her father.  ROS:   Please see the history of present illness.     All other systems reviewed and are negative.  EKGs/Labs/Other Studies Reviewed:    The following studies were reviewed today:  Echo 04/14/23:  1. Left ventricular ejection fraction, by estimation, is 55 to 60%. The  left ventricle has normal function. The left ventricle has no regional  wall motion abnormalities. Left ventricular diastolic  function could not  be evaluated.   2. Right ventricular systolic function is normal. The right ventricular  size is normal.   3. The mitral valve is grossly normal. Trivial mitral valve  regurgitation. No evidence of mitral stenosis.   4. The aortic valve is grossly normal. There is mild calcification of the  aortic valve. There is mild thickening of the aortic valve. Aortic valve  regurgitation is trivial.   EKG Interpretation Date/Time:  Wednesday May 05 2023 08:26:16 EDT Ventricular Rate:  59 PR Interval:  162 QRS Duration:  84 QT Interval:  396 QTC Calculation: 392 R Axis:   29  Text Interpretation: Sinus bradycardia Septal infarct , age undetermined When compared with ECG of 15-Apr-2023 15:10, Septal infarct is now Present QT has shortened Confirmed by Micah Flesher ([id number]) on 05/05/2023 8:37:22 AM  Recent Labs: 04/14/2023: Magnesium 2.2; TSH 0.244 04/15/2023: ALT 24; BUN 13; Creatinine, Ser 0.89; Hemoglobin 13.6; Platelets 176; Potassium 3.9; Sodium 141  Recent Lipid Panel    Component Value Date/Time   CHOL 139 04/02/2023 0916   CHOL 167 11/26/2022 0823   TRIG 73.0 04/02/2023 0916   HDL 58.40 04/02/2023 0916   HDL 70 11/26/2022 0823   CHOLHDL 2 04/02/2023 0916   VLDL 14.6 04/02/2023 0916   LDLCALC 66 04/02/2023 0916   LDLCALC 77 11/26/2022 0823   LDLDIRECT 139.0 06/17/2018 1212     Risk Assessment/Calculations:    CHA2DS2-VASc Score = 3   This indicates a 3.2% annual risk of stroke. The patient's score is based upon: CHF History: 0 HTN History: 0 Diabetes History: 0 Stroke History: 0 Vascular Disease History: 1 Age Score: 1 Gender Score: 1            Physical Exam:    VS:  BP 120/66 (BP Location: Left Arm, Patient Position: Sitting, Cuff Size: Normal)   Pulse (!) 59   Ht 5\' 10"  (1.778 m)   Wt 162 lb 9.6 oz (73.8 kg)   LMP  (LMP Unknown)   SpO2 98%   BMI 23.33 kg/m     Wt Readings from Last 3 Encounters:  05/05/23 162 lb 9.6 oz (73.8 kg)   04/22/23 164 lb 9.6 oz (74.7 kg)  04/15/23 170 lb (77.1 kg)     GEN:  Well nourished, well developed in no acute distress HEENT: Normal NECK: No JVD; No carotid bruits LYMPHATICS: No lymphadenopathy CARDIAC: RRR, no murmurs, rubs, gallops RESPIRATORY:  Clear to auscultation without rales, wheezing or rhonchi  ABDOMEN: Soft, non-tender, non-distended MUSCULOSKELETAL:  No edema; No deformity  SKIN: Warm and dry NEUROLOGIC:  Alert and oriented x 3 PSYCHIATRIC:  Normal affect   ASSESSMENT:    1. Palpitations   2. PAF (paroxysmal atrial fibrillation) (HCC)   3. Chronic anticoagulation   4. Hyperlipidemia with target LDL less than 70   5. Acquired hypothyroidism    PLAN:    In order of problems listed above:  Paroxysmal atrial fibrillation TEE-DCCV on 04/15/2023 Low-dose Cardizem and Toprol Maintaining sinus rhythm, doing well   Chronic anticoagulation No bleeding issues on 5 mg Eliquis twice daily   Hyperlipidemia On Crestor and Zetia   Hypothyroidism Synthroid dose was further reduced during her hospitalization Per Dr. Mardelle Matte       Follow up in 6 months with me, then likely annual basis.      Medication Adjustments/Labs and Tests Ordered: Current medicines are reviewed at length with the patient today.  Concerns regarding medicines are outlined above.  Orders Placed This Encounter  Procedures   EKG 12-Lead   No orders of the defined types were placed in this encounter.   There are no Patient Instructions on file for this visit.   Signed, Tina Dalton, [place]  05/05/2023 8:52 AM    [facility]

## 2023-05-05 ENCOUNTER — Ambulatory Visit: Payer: Medicare Other | Admitting: Physician Assistant

## 2023-05-05 ENCOUNTER — Encounter: Payer: Self-pay | Admitting: Physician Assistant

## 2023-05-05 VITALS — BP 120/66 | HR 59 | Ht 70.0 in | Wt 162.6 lb

## 2023-05-05 DIAGNOSIS — R002 Palpitations: Secondary | ICD-10-CM

## 2023-05-05 DIAGNOSIS — Z7901 Long term (current) use of anticoagulants: Secondary | ICD-10-CM

## 2023-05-05 DIAGNOSIS — E7800 Pure hypercholesterolemia, unspecified: Secondary | ICD-10-CM

## 2023-05-05 DIAGNOSIS — E039 Hypothyroidism, unspecified: Secondary | ICD-10-CM | POA: Diagnosis not present

## 2023-05-05 DIAGNOSIS — E785 Hyperlipidemia, unspecified: Secondary | ICD-10-CM

## 2023-05-05 DIAGNOSIS — I480 Paroxysmal atrial fibrillation: Secondary | ICD-10-CM

## 2023-05-05 MED ORDER — APIXABAN 5 MG PO TABS
5.0000 mg | ORAL_TABLET | Freq: Two times a day (BID) | ORAL | 3 refills | Status: DC
Start: 2023-05-05 — End: 2024-01-21

## 2023-05-05 MED ORDER — ROSUVASTATIN CALCIUM 40 MG PO TABS: 40.0000 mg | ORAL_TABLET | Freq: Every day | ORAL | 3 refills | Status: DC

## 2023-05-05 MED ORDER — EZETIMIBE 10 MG PO TABS
10.0000 mg | ORAL_TABLET | Freq: Every day | ORAL | 3 refills | Status: DC
Start: 2023-05-05 — End: 2024-01-21

## 2023-05-05 MED ORDER — DILTIAZEM HCL ER COATED BEADS 120 MG PO CP24
120.0000 mg | ORAL_CAPSULE | Freq: Every day | ORAL | 3 refills | Status: DC
Start: 2023-05-05 — End: 2024-01-21

## 2023-05-05 MED ORDER — METOPROLOL SUCCINATE ER 25 MG PO TB24
25.0000 mg | ORAL_TABLET | Freq: Every day | ORAL | 3 refills | Status: DC
Start: 2023-05-05 — End: 2023-07-08

## 2023-05-05 NOTE — Patient Instructions (Signed)
Medication Instructions:  - No changes  *If you need a refill on your cardiac medications before your next appointment, please call your pharmacy*   Lab Work: - None ordered  Testing/Procedures: - None ordered   Follow-Up: At [facility], you and your health needs are our priority.  As part of our continuing mission to provide you with exceptional heart care, we have created designated Provider Care Teams.  These Care Teams include your primary Cardiologist (physician) and Advanced Practice Providers (APPs -  Physician Assistants and Nurse Practitioners) who all work together to provide you with the care you need, when you need it.  We recommend signing up for the patient portal called "MyChart".  Sign up information is provided on this After Visit Summary.  MyChart is used to connect with patients for Virtual Visits (Telemedicine).  Patients are able to view lab/test results, encounter notes, upcoming appointments, etc.  Non-urgent messages can be sent to your provider as well.   To learn more about what you can do with MyChart, go to [email or web address].    Your next appointment:   6 month(s)  Provider:   Olga Millers, MD  or Micah Flesher, PA-C

## 2023-05-15 ENCOUNTER — Emergency Department (HOSPITAL_COMMUNITY): Payer: Medicare Other

## 2023-05-15 ENCOUNTER — Other Ambulatory Visit: Payer: Self-pay

## 2023-05-15 ENCOUNTER — Emergency Department (HOSPITAL_COMMUNITY)
Admission: EM | Admit: 2023-05-15 | Discharge: 2023-05-15 | Disposition: A | Discharge status: Home / Self Care | Payer: Medicare Other | Attending: Emergency Medicine | Admitting: Emergency Medicine

## 2023-05-15 DIAGNOSIS — H538 Other visual disturbances: Secondary | ICD-10-CM | POA: Insufficient documentation

## 2023-05-15 DIAGNOSIS — Z79899 Other long term (current) drug therapy: Secondary | ICD-10-CM | POA: Insufficient documentation

## 2023-05-15 DIAGNOSIS — Z7901 Long term (current) use of anticoagulants: Secondary | ICD-10-CM | POA: Insufficient documentation

## 2023-05-15 DIAGNOSIS — E876 Hypokalemia: Secondary | ICD-10-CM | POA: Diagnosis not present

## 2023-05-15 DIAGNOSIS — G459 Transient cerebral ischemic attack, unspecified: Secondary | ICD-10-CM | POA: Diagnosis not present

## 2023-05-15 DIAGNOSIS — H539 Unspecified visual disturbance: Secondary | ICD-10-CM

## 2023-05-15 DIAGNOSIS — I6782 Cerebral ischemia: Secondary | ICD-10-CM | POA: Diagnosis not present

## 2023-05-15 DIAGNOSIS — R002 Palpitations: Secondary | ICD-10-CM | POA: Diagnosis not present

## 2023-05-15 DIAGNOSIS — R55 Syncope and collapse: Secondary | ICD-10-CM | POA: Diagnosis not present

## 2023-05-15 DIAGNOSIS — E785 Hyperlipidemia, unspecified: Secondary | ICD-10-CM | POA: Insufficient documentation

## 2023-05-15 DIAGNOSIS — R42 Dizziness and giddiness: Secondary | ICD-10-CM | POA: Diagnosis not present

## 2023-05-15 DIAGNOSIS — I4891 Unspecified atrial fibrillation: Secondary | ICD-10-CM | POA: Diagnosis not present

## 2023-05-15 DIAGNOSIS — R29818 Other symptoms and signs involving the nervous system: Secondary | ICD-10-CM | POA: Diagnosis not present

## 2023-05-15 LAB — URINALYSIS, ROUTINE W REFLEX MICROSCOPIC
Bilirubin Urine: NEGATIVE
Glucose, UA: NEGATIVE mg/dL
Hgb urine dipstick: NEGATIVE
Ketones, ur: NEGATIVE mg/dL
Nitrite: NEGATIVE
Protein, ur: NEGATIVE mg/dL
Specific Gravity, Urine: 1.012 (ref 1.005–1.030)
pH: 6 (ref 5.0–8.0)

## 2023-05-15 LAB — BASIC METABOLIC PANEL
Anion gap: 5 (ref 5–15)
BUN: 21 mg/dL (ref 8–23)
CO2: 23 mmol/L (ref 22–32)
Calcium: 7.2 mg/dL — ABNORMAL LOW (ref 8.9–10.3)
Chloride: 114 mmol/L — ABNORMAL HIGH (ref 98–111)
Creatinine, Ser: 0.78 mg/dL (ref 0.44–1.00)
GFR, Estimated: >60 mL/min (ref >60)
Glucose, Bld: 106 mg/dL — ABNORMAL HIGH (ref 70–99)
Potassium: 3.2 mmol/L — ABNORMAL LOW (ref 3.5–5.1)
Sodium: 142 mmol/L (ref 135–145)

## 2023-05-15 LAB — CBC
HCT: 44.1 % (ref 36.0–46.0)
Hemoglobin: 13.8 g/dL (ref 12.0–15.0)
MCH: 28.6 pg (ref 26.0–34.0)
MCHC: 31.3 g/dL (ref 30.0–36.0)
MCV: 91.3 fL (ref 80.0–100.0)
Platelets: 194 10*3/uL (ref 150–400)
RBC: 4.83 MIL/uL (ref 3.87–5.11)
RDW: 13.4 % (ref 11.5–15.5)
WBC: 8.2 10*3/uL (ref 4.0–10.5)
nRBC: 0 % (ref 0.0–0.2)

## 2023-05-15 LAB — CBG MONITORING, ED: Glucose-Capillary: 95 mg/dL (ref 70–99)

## 2023-05-15 LAB — TROPONIN I (HIGH SENSITIVITY)
Troponin I (High Sensitivity): 3 ng/L (ref <18)
Troponin I (High Sensitivity): 4 ng/L (ref <18)

## 2023-05-15 MED ORDER — POTASSIUM CHLORIDE CRYS ER 20 MEQ PO TBCR
40.0000 meq | EXTENDED_RELEASE_TABLET | Freq: Once | ORAL | Status: AC
  Administered 2023-05-15: 40 meq via ORAL
  Filled 2023-05-15: qty 2

## 2023-05-15 MED ORDER — GADOBUTROL 1 MMOL/ML IV SOLN
8.0000 mL | Freq: Once | INTRAVENOUS | Status: AC | PRN
  Administered 2023-05-15: 8 mL via INTRAVENOUS

## 2023-05-15 NOTE — Discharge Instructions (Addendum)
It was a pleasure taking care of you here in the emergency department today  Your MRIs and CT scans did not show any significant abnormality  Did have a mildly low potassium as well as calcium.  Treat with a calcium supplement emergency department would recommend increasing potassium foods at home.  With regards to your low calcium would recommend taking 3 Tums over-the-counter daily and following up with a primary care provider in 1 week for repeat labs  It is unclear why you are having these episodes with your vision as well as the dizziness.  I placed a referral to a neurologist.  They should call you to schedule an appointment however if you do not hear from them by Wednesday of next week please call to schedule appointment.  Would also recommend following up with an ophthalmologist.  The numbers listed in your discharge paperwork you need to call them as well.  Recommend calling them on Monday morning  Return for new or worsening symptoms.

## 2023-05-15 NOTE — ED Provider Notes (Signed)
[place] EMERGENCY DEPARTMENT AT [facility] Provider Note   CSN: [id number] Arrival date & time: 05/15/23  1209     History  Chief Complaint  Patient presents with   Dizziness    Tina Dalton is a 72 y.o. female with PMH a-fib status post TEE cardioversion last month, hyperlipidemia, thyroid disease who presents to the ED complaining of intermittent dizziness for the last 2 days.  She states that 2 days ago she was standing when she felt lightheaded like she was going to pass out.  Notes that when this happened she had separations in her vision where the visual field on the right side looks different than the visual field on the left side.  This lasted a few minutes before resolving.  She has had intermittent episodes of dizziness and lightheadedness since then but usually occur with position changes and then today began to have palpitations.  No chest pain, shortness of breath, headache, visual field loss or cuts, nausea, vomiting, diarrhea, abdominal pain, focal weakness, or any other complaints.  History of dizziness last night before being hospitalized with A-fib but she is status post TEE cardioversion and in sinus rhythm on arrival.  She has been taking her medications including Cardizem, Toprol, and Eliquis.  No recent long distance travel, no history of DVT/PE, no changes in activity level.  No episodes of separations in vision since 2 days ago but has had intermittent episodes of "kaleidoscope" vision intermittently where inferior visual fields appear similar to looking through a kaleidoscope.       Home Medications Prior to Admission medications   Medication Sig Start Date End Date Taking? Authorizing Provider  apixaban (ELIQUIS) 5 MG TABS tablet Take 1 tablet (5 mg total) by mouth 2 (two) times daily. 05/05/23  Yes Duke, Roe Rutherford, PA  diltiazem (CARDIZEM CD) 120 MG 24 hr capsule Take 1 capsule (120 mg total) by mouth daily. 05/05/23  Yes Duke, Roe Rutherford, PA   ezetimibe (ZETIA) 10 MG tablet Take 1 tablet (10 mg total) by mouth daily. 05/05/23  Yes Duke, Roe Rutherford, PA  levothyroxine (SYNTHROID) 88 MCG tablet Take 1 tablet (88 mcg total) by mouth daily before breakfast. 04/15/23 07/14/23 Yes Dahal, Melina Schools, MD  metoprolol succinate (TOPROL-XL) 25 MG 24 hr tablet Take 1 tablet (25 mg total) by mouth at bedtime. 05/05/23  Yes Duke, Roe Rutherford, PA  rosuvastatin (CRESTOR) 40 MG tablet Take 1 tablet (40 mg total) by mouth daily. 05/05/23  Yes Duke, Roe Rutherford, PA      Allergies    Patient has no known allergies.    Review of Systems   Review of Systems  All other systems reviewed and are negative.   Physical Exam Updated Vital Signs BP 131/66   Pulse 67   Temp 98.4 F (36.9 C) (Oral)   Resp 16   Ht 5\' 10"  (1.778 m)   Wt 79.4 kg   LMP  (LMP Unknown)   SpO2 97%   BMI 25.11 kg/m  Physical Exam Vitals and nursing note reviewed.  Constitutional:      General: She is not in acute distress.    Appearance: Normal appearance. She is not ill-appearing or toxic-appearing.  HENT:     Head: Normocephalic and atraumatic.     Mouth/Throat:     Mouth: Mucous membranes are moist.  Eyes:     General: No visual field deficit.    Extraocular Movements: Extraocular movements intact.     Conjunctiva/sclera: Conjunctivae normal.  Pupils: Pupils are equal, round, and reactive to light.  Cardiovascular:     Rate and Rhythm: Normal rate and regular rhythm.     Heart sounds: No murmur heard. Pulmonary:     Effort: Pulmonary effort is normal. No respiratory distress.     Breath sounds: Normal breath sounds. No stridor. No wheezing, rhonchi or rales.  Abdominal:     General: Abdomen is flat. There is no distension.     Palpations: Abdomen is soft.     Tenderness: There is no abdominal tenderness. There is no guarding or rebound.     Comments: No pulsatile mass  Musculoskeletal:        General: Normal range of motion.     Cervical back: Normal  range of motion and neck supple. No rigidity.     Right lower leg: No edema.     Left lower leg: No edema.     Comments: No calf tenderness bilaterally, negative Homans' sign  Skin:    General: Skin is warm and dry.     Capillary Refill: Capillary refill takes less than 2 seconds.  Neurological:     General: No focal deficit present.     Mental Status: She is alert and oriented to person, place, and time.     GCS: GCS eye subscore is 4. GCS verbal subscore is 5. GCS motor subscore is 6.     Cranial Nerves: Cranial nerves 2-12 are intact. No cranial nerve deficit, dysarthria or facial asymmetry.     Sensory: Sensation is intact.     Motor: Motor function is intact. No weakness, tremor, atrophy or seizure activity.     Coordination: Coordination is intact.  Psychiatric:        Speech: Speech normal.        Behavior: Behavior normal. Behavior is cooperative.     ED Results / Procedures / Treatments   Labs (all labs ordered are listed, but only abnormal results are displayed) Labs Reviewed  BASIC METABOLIC PANEL - Abnormal; Notable for the following components:      Result Value   Potassium 3.2 (*)    Chloride 114 (*)    Glucose, Bld 106 (*)    Calcium 7.2 (*)    All other components within normal limits  CBC  URINALYSIS, ROUTINE W REFLEX MICROSCOPIC  CBG MONITORING, ED  TROPONIN I (HIGH SENSITIVITY)  TROPONIN I (HIGH SENSITIVITY)    EKG EKG Interpretation Date/Time:  Saturday May 15 2023 12:18:56 EDT Ventricular Rate:  86 PR Interval:  164 QRS Duration:  80 QT Interval:  362 QTC Calculation: 433 R Axis:   36  Text Interpretation: Sinus rhythm No significant change since last tracing Confirmed by Melene Plan [phone]) on 05/15/2023 2:32:06 PM  Radiology DG Chest 2 View  Result Date: 05/15/2023 CLINICAL DATA:  Palpitations EXAM: CHEST - 2 VIEW COMPARISON:  X-ray 04/14/2023 FINDINGS: The heart size and mediastinal contours are within normal limits. No consolidation,  pneumothorax or effusion. No edema. Overlapping cardiac leads. Degenerative changes along the spine. IMPRESSION: No acute cardiopulmonary disease. Electronically Signed   By: Karen Kays M.D.   On: 05/15/2023 14:32   CT Head Wo Contrast  Result Date: 05/15/2023 CLINICAL DATA:  Provided history: Neuro deficit, acute, stroke suspected. Additional history provided: Dizziness, vision changes. EXAM: CT HEAD WITHOUT CONTRAST TECHNIQUE: Contiguous axial images were obtained from the base of the skull through the vertex without intravenous contrast. RADIATION DOSE REDUCTION: This exam was performed according to the departmental  dose-optimization program which includes automated exposure control, adjustment of the mA and/or kV according to patient size and/or use of iterative reconstruction technique. COMPARISON:  Head CT 04/14/2023. FINDINGS: Brain: No age advanced or lobar predominant parenchymal atrophy. There is no acute intracranial hemorrhage. No demarcated cortical infarct. No extra-axial fluid collection. No evidence of an intracranial mass. No midline shift. Vascular: No hyperdense vessel.  Atherosclerotic calcifications. Skull: No calvarial fracture. Redemonstrated chronic 11 mm bony protuberance projecting outward from the right pterional calvarium, possibly reflecting an osteoma. Sinuses/Orbits: No orbital mass or acute orbital finding. No significant paranasal sinus disease at the imaged levels. IMPRESSION: No evidence of an acute intracranial abnormality. Electronically Signed   By: Jackey Loge D.O.   On: 05/15/2023 14:06    Procedures Procedures    Medications Ordered in ED Medications - No data to display  ED Course/ Medical Decision Making/ A&P Clinical Course as of 05/15/23 1524  Sat May 15, 2023  1459 Intermittent Dizzy [BH]    Clinical Course User Index [BH] Henderly, Britni A, PA-C                                 Medical Decision Making Amount and/or Complexity of Data  Reviewed Labs: ordered. Decision-making details documented in ED Course. Radiology: ordered. Decision-making details documented in ED Course. ECG/medicine tests: ordered. Decision-making details documented in ED Course.   Medical Decision Making:   Elishah Behen is a 72 y.o. female who presented to the ED today with dizziness detailed above.    Additional history discussed with patient's family/caregivers.  Patient's presentation is complicated by their history of A-fib, hyperlipidemia, recent hospitalization and cardioversion.  Complete initial physical exam performed, notably the patient was in no acute distress, nontoxic-appearing.  Regular rate and rhythm, lungs clear to auscultation, no respiratory distress.  Neurologically intact.    Reviewed and confirmed nursing documentation for past medical history, family history, social history.    Initial Assessment:   With the patient's presentation, differential diagnosis includes but is not limited to BPPV, vestibular migraine, head trauma, AVM, intracranial tumor, multiple sclerosis, drug-related, CVA, vasovagal syncope, orthostatic hypotension, sepsis, hypoglycemia, electrolyte disturbance, anemia, anxiety/panic attack  This is most consistent with an acute complicated illness  Initial Plan:  Screening labs including CBC and Metabolic panel to evaluate for infectious or metabolic etiology of disease.  Urinalysis with reflex culture ordered to evaluate for UTI or relevant urologic/nephrologic pathology.  CXR to evaluate for structural/infectious intrathoracic pathology.  EKG and troponin to evaluate for cardiac pathology CT brain to assess for intracranial pathology Orthostatics Objective evaluation as below reviewed   Initial Study Results:   Laboratory  All laboratory results reviewed without evidence of clinically relevant pathology.   Exceptions include: K 3.2   EKG EKG was reviewed independently. NSR. No acute ST-T changes. No  STEMI.   Radiology:  All images reviewed independently. Agree with radiology report at this time.   DG Chest 2 View  Result Date: 05/15/2023 CLINICAL DATA:  Palpitations EXAM: CHEST - 2 VIEW COMPARISON:  X-ray 04/14/2023 FINDINGS: The heart size and mediastinal contours are within normal limits. No consolidation, pneumothorax or effusion. No edema. Overlapping cardiac leads. Degenerative changes along the spine. IMPRESSION: No acute cardiopulmonary disease. Electronically Signed   By: Karen Kays M.D.   On: 05/15/2023 14:32   CT Head Wo Contrast  Result Date: 05/15/2023 CLINICAL DATA:  Provided history: Neuro deficit, acute, stroke  suspected. Additional history provided: Dizziness, vision changes. EXAM: CT HEAD WITHOUT CONTRAST TECHNIQUE: Contiguous axial images were obtained from the base of the skull through the vertex without intravenous contrast. RADIATION DOSE REDUCTION: This exam was performed according to the departmental dose-optimization program which includes automated exposure control, adjustment of the mA and/or kV according to patient size and/or use of iterative reconstruction technique. COMPARISON:  Head CT 04/14/2023. FINDINGS: Brain: No age advanced or lobar predominant parenchymal atrophy. There is no acute intracranial hemorrhage. No demarcated cortical infarct. No extra-axial fluid collection. No evidence of an intracranial mass. No midline shift. Vascular: No hyperdense vessel.  Atherosclerotic calcifications. Skull: No calvarial fracture. Redemonstrated chronic 11 mm bony protuberance projecting outward from the right pterional calvarium, possibly reflecting an osteoma. Sinuses/Orbits: No orbital mass or acute orbital finding. No significant paranasal sinus disease at the imaged levels. IMPRESSION: No evidence of an acute intracranial abnormality. Electronically Signed   By: Jackey Loge D.O.   On: 05/15/2023 14:06   MM 3D SCREENING MAMMOGRAM BILATERAL BREAST  Result Date:  04/21/2023 CLINICAL DATA:  Screening. EXAM: DIGITAL SCREENING BILATERAL MAMMOGRAM WITH TOMOSYNTHESIS AND CAD TECHNIQUE: Bilateral screening digital craniocaudal and mediolateral oblique mammograms were obtained. Bilateral screening digital breast tomosynthesis was performed. The images were evaluated with computer-aided detection. COMPARISON:  Previous exam(s). ACR Breast Density Category b: There are scattered areas of fibroglandular density. FINDINGS: There are no findings suspicious for malignancy. IMPRESSION: No mammographic evidence of malignancy. A result letter of this screening mammogram will be mailed directly to the patient. RECOMMENDATION: Screening mammogram in one year. (Code:SM-B-01Y) BI-RADS CATEGORY  1: Negative. Electronically Signed   By: Baird Lyons M.D.   On: 04/21/2023 11:05   ECHO TEE  Result Date: 04/15/2023    TRANSESOPHOGEAL ECHO REPORT   Patient Name:   YARETHZI VANDALEN Date of Exam: 04/15/2023 Medical Rec #:  [id number]   Height:       70.0 in Accession #:    [id number]  Weight:       170.0 lb Date of Birth:  1950-11-07   BSA:          1.948 m Patient Age:    71 years    BP:           112/76 mmHg Patient Gender: F           HR:           120 bpm. Exam Location:  Inpatient Procedure: Transesophageal Echo, Cardiac Doppler and Color Doppler Indications:     I48.91* Unspeicified atrial fibrillation  History:         Patient has prior history of Echocardiogram examinations, most                  recent 04/14/2023. Abnormal ECG, Arrythmias:Atrial Fibrillation,                  Signs/Symptoms:Murmur; Risk Factors:Hypertension.  Sonographer:     Sheralyn Boatman RDCS Referring Phys:  [id number] Kathlynn Grate [place] Diagnosing Phys: Laurance Flatten MD PROCEDURE: After discussion of the risks and benefits of a TEE, an informed consent was obtained from the patient. The transesophogeal probe was passed without difficulty through the esophogus of the patient. Imaged were obtained with the patient in a left lateral  decubitus position. Sedation performed by different physician. The patient was monitored while under deep sedation. Anesthestetic sedation was provided intravenously by Anesthesiology: 194mg  of Propofol, 80mg  of Lidocaine. The patient's vital signs; including heart rate, blood pressure, and  oxygen saturation; remained stable throughout the procedure. The patient developed no complications during the procedure. A successful direct current cardioversion was performed at 120 joules with 1 attempt.  IMPRESSIONS  1. Left ventricular ejection fraction, by estimation, is 55 to 60%. The left ventricle has normal function.  2. Right ventricular systolic function is normal. The right ventricular size is normal.  3. No left atrial/left atrial appendage thrombus was detected. The LAA emptying velocity was 75 cm/s.  4. The mitral valve is normal in structure. Trivial mitral valve regurgitation.  5. The aortic valve is tricuspid. Aortic valve regurgitation is trivial. No aortic stenosis is present.  6. Following TEE, the patient underwent successful DCCV with 150J x1. FINDINGS  Left Ventricle: Left ventricular ejection fraction, by estimation, is 55 to 60%. The left ventricle has normal function. The left ventricular internal cavity size was normal in size. Right Ventricle: The right ventricular size is normal. No increase in right ventricular wall thickness. Right ventricular systolic function is normal. Left Atrium: Left atrial size was normal in size. No left atrial/left atrial appendage thrombus was detected. The LAA emptying velocity was 75 cm/s. Right Atrium: Right atrial size was normal in size. Pericardium: There is no evidence of pericardial effusion. Mitral Valve: The mitral valve is normal in structure. Trivial mitral valve regurgitation. Tricuspid Valve: The tricuspid valve is normal in structure. Tricuspid valve regurgitation is not demonstrated. Aortic Valve: The aortic valve is tricuspid. Aortic valve regurgitation  is trivial. No aortic stenosis is present. Pulmonic Valve: The pulmonic valve was normal in structure. Pulmonic valve regurgitation is trivial. No evidence of pulmonic stenosis. Aorta: The aortic root and ascending aorta are structurally normal, with no evidence of dilitation. IAS/Shunts: No atrial level shunt detected by color flow Doppler. Additional Comments: Spectral Doppler performed. LEFT VENTRICLE PLAX 2D LVOT diam:     2.10 cm LVOT Area:     3.46 cm   AORTA Ao Asc diam: 3.30 cm  SHUNTS Systemic Diam: 2.10 cm Laurance Flatten MD Electronically signed by Laurance Flatten MD Signature Date/Time: 04/15/2023/4:21:41 PM    Final       Consults: Case discussed with Dr. Otelia Limes with neurology who recommended MRI head, MRA brain.   Final Assessment and Plan:   72 year old female presents to ED with episodes of near syncope, visual changes for 2 days. Vital signs reassuring. No headache, chest pain. Neurologically intact without focal deficits. Well appearing. Workup initiated as above for further assessment. NSR on EKG. Troponin normal. CXR normal. CT brain with no acute findings. Discussed with Dr. Otelia Limes as above and will obtain MRI imaging with new visual changes. Pending repeat trop, MRI/MRA, disposition, care transitioned to oncoming PA-C, Henderly. Pt stable at time of care transition.     Clinical Impression:  1. Near syncope   2. Visual changes      Data Unavailable           Final Clinical Impression(s) / ED Diagnoses Final diagnoses:  Near syncope  Visual changes    Rx / DC Orders ED Discharge Orders     None         Tonette Lederer, PA-C 05/15/23 1526    Melene Plan, DO 05/16/23 [phone]

## 2023-05-15 NOTE — ED Notes (Signed)
Pt taken to MRI  

## 2023-05-15 NOTE — ED Triage Notes (Signed)
Patient reports she had ablation for AFIB a month ago Reports 3-4 days ago she had split vision and dizziness Today she woke up feeling dizzy again Denies pain

## 2023-05-15 NOTE — ED Provider Notes (Signed)
Care assumed from previous provider.  See note for full HPI.  72 year old history of A-fib post TEE cardioversion last month here for evaluation intermittent dizziness over the last few days.  Lightheadedness, near syncope as well as some vision changes.  Labs and CT head without significant finding per previous provider.  She discussed with neurology, Dr. Otelia Limes who recommended MRI brain as well as MRA head and neck.  If negative patient can DC home. Physical Exam  BP 116/71   Pulse 72   Temp 97.7 F (36.5 C)   Resp 10   Ht 5\' 10"  (1.778 m)   Wt 79.4 kg   LMP  (LMP Unknown)   SpO2 100%   BMI 25.11 kg/m   Physical Exam Vitals and nursing note reviewed.  Constitutional:      General: She is not in acute distress.    Appearance: She is well-developed. She is not ill-appearing.  HENT:     Head: Atraumatic.  Eyes:     Pupils: Pupils are equal, round, and reactive to light.  Cardiovascular:     Rate and Rhythm: Normal rate.  Pulmonary:     Effort: No respiratory distress.  Abdominal:     General: There is no distension.  Musculoskeletal:        General: Normal range of motion.     Cervical back: Normal range of motion.  Skin:    General: Skin is warm and dry.  Neurological:     General: No focal deficit present.     Mental Status: She is alert.     Cranial Nerves: Cranial nerves 2-12 are intact.     Sensory: Sensation is intact.     Motor: Motor function is intact.     Gait: Gait is intact.  Psychiatric:        Mood and Affect: Mood normal.     Procedures  Procedures Labs Reviewed  BASIC METABOLIC PANEL - Abnormal; Notable for the following components:      Result Value   Potassium 3.2 (*)    Chloride 114 (*)    Glucose, Bld 106 (*)    Calcium 7.2 (*)    All other components within normal limits  URINALYSIS, ROUTINE W REFLEX MICROSCOPIC - Abnormal; Notable for the following components:   Leukocytes,Ua SMALL (*)    Bacteria, UA RARE (*)    All other  components within normal limits  CBC  CBG MONITORING, ED  TROPONIN I (HIGH SENSITIVITY)  TROPONIN I (HIGH SENSITIVITY)   MR Angiogram Neck W or Wo Contrast  Result Date: 05/15/2023 CLINICAL DATA:  Stroke suspected, transient ischemic attack EXAM: MRA NECK WITHOUT AND WITH CONTRAST TECHNIQUE: Multiplanar and multiecho pulse sequences of the neck were obtained without and with intravenous contrast. Angiographic images of the neck were obtained using MRA technique without and with intravenous contrast. CONTRAST:  8mL GADAVIST GADOBUTROL 1 MMOL/ML IV SOLN COMPARISON:  None Available. FINDINGS: Aortic arch: The aortic arch is incompletely included in the field of view, but appears to have standard branching. Right carotid system: No evidence of dissection, occlusion, or hemodynamically significant stenosis (greater than 50%). Left carotid system: No evidence of dissection, occlusion, or hemodynamically significant stenosis (greater than 50%). Vertebral arteries: No evidence of dissection, occlusion, or hemodynamically significant stenosis (greater than 50%). Other: None. IMPRESSION: No evidence of dissection, occlusion, or hemodynamically significant stenosis of the major arteries of the neck. Electronically Signed   By: Wiliam Ke M.D.   On: 05/15/2023 21:20  MR ANGIO HEAD WO CONTRAST  Result Date: 05/15/2023 CLINICAL DATA:  Stroke suspected, dizziness EXAM: MRA HEAD WITHOUT CONTRAST TECHNIQUE: Angiographic images of the Circle of Willis were acquired using MRA technique without intravenous contrast. COMPARISON:  No prior MRA available. Correlation is made with MRI head 05/15/2023 FINDINGS: Anterior circulation: Both internal carotid arteries are patent to the termini, without significant stenosis. A1 segments patent. Normal anterior communicating artery. Anterior cerebral arteries are patent to their distal aspects without significant stenosis. No M1 stenosis or occlusion. Distal MCA branches perfused to  their distal aspects without significant stenosis. Posterior circulation: Imaged vertebral arteries patent to the vertebrobasilar junction without stenosis. Basilar patent to its distal aspect. Superior cerebellar arteries patent proximally. Patent right P1. Possible hypoplastic left P1. Fetal origin of the left PCA from the left posterior communicating artery. PCAs perfused to their distal aspects without significant stenosis. The right posterior communicating artery is not visualized. Anatomic variants: Fetal origin of the left PCA. IMPRESSION: No intracranial large vessel occlusion or significant stenosis. Electronically Signed   By: Wiliam Ke M.D.   On: 05/15/2023 21:16   MR BRAIN WO CONTRAST  Result Date: 05/15/2023 CLINICAL DATA:  Neuro deficit, acute, stroke suspected.  Dizziness. EXAM: MRI HEAD WITHOUT CONTRAST TECHNIQUE: Multiplanar, multiecho pulse sequences of the brain and surrounding structures were obtained without intravenous contrast. COMPARISON:  Head CT 05/15/2023 FINDINGS: Brain: There is no evidence of an acute infarct, intracranial hemorrhage, mass, midline shift, or extra-axial fluid collection. The ventricles and sulci are normal. Scattered small T2 hyperintensities in the cerebral white matter nonspecific but compatible with minimal chronic small vessel ischemic disease. Vascular: Major intracranial vascular flow voids are preserved. Skull and upper cervical spine: Unremarkable bone marrow signal. Sinuses/Orbits: Bilateral cataract extraction. Paranasal sinuses and mastoid air cells are clear. Other: None. IMPRESSION: 1. No acute intracranial abnormality. 2. Minimal chronic small vessel ischemic disease. Electronically Signed   By: Sebastian Ache M.D.   On: 05/15/2023 20:05   DG Chest 2 View  Result Date: 05/15/2023 CLINICAL DATA:  Palpitations EXAM: CHEST - 2 VIEW COMPARISON:  X-ray 04/14/2023 FINDINGS: The heart size and mediastinal contours are within normal limits. No  consolidation, pneumothorax or effusion. No edema. Overlapping cardiac leads. Degenerative changes along the spine. IMPRESSION: No acute cardiopulmonary disease. Electronically Signed   By: Karen Kays M.D.   On: 05/15/2023 14:32   CT Head Wo Contrast  Result Date: 05/15/2023 CLINICAL DATA:  Provided history: Neuro deficit, acute, stroke suspected. Additional history provided: Dizziness, vision changes. EXAM: CT HEAD WITHOUT CONTRAST TECHNIQUE: Contiguous axial images were obtained from the base of the skull through the vertex without intravenous contrast. RADIATION DOSE REDUCTION: This exam was performed according to the departmental dose-optimization program which includes automated exposure control, adjustment of the mA and/or kV according to patient size and/or use of iterative reconstruction technique. COMPARISON:  Head CT 04/14/2023. FINDINGS: Brain: No age advanced or lobar predominant parenchymal atrophy. There is no acute intracranial hemorrhage. No demarcated cortical infarct. No extra-axial fluid collection. No evidence of an intracranial mass. No midline shift. Vascular: No hyperdense vessel.  Atherosclerotic calcifications. Skull: No calvarial fracture. Redemonstrated chronic 11 mm bony protuberance projecting outward from the right pterional calvarium, possibly reflecting an osteoma. Sinuses/Orbits: No orbital mass or acute orbital finding. No significant paranasal sinus disease at the imaged levels. IMPRESSION: No evidence of an acute intracranial abnormality. Electronically Signed   By: Jackey Loge D.O.   On: 05/15/2023 14:06   MM 3D SCREENING  MAMMOGRAM BILATERAL BREAST  Result Date: 04/21/2023 CLINICAL DATA:  Screening. EXAM: DIGITAL SCREENING BILATERAL MAMMOGRAM WITH TOMOSYNTHESIS AND CAD TECHNIQUE: Bilateral screening digital craniocaudal and mediolateral oblique mammograms were obtained. Bilateral screening digital breast tomosynthesis was performed. The images were evaluated with  computer-aided detection. COMPARISON:  Previous exam(s). ACR Breast Density Category b: There are scattered areas of fibroglandular density. FINDINGS: There are no findings suspicious for malignancy. IMPRESSION: No mammographic evidence of malignancy. A result letter of this screening mammogram will be mailed directly to the patient. RECOMMENDATION: Screening mammogram in one year. (Code:SM-B-01Y) BI-RADS CATEGORY  1: Negative. Electronically Signed   By: Baird Lyons M.D.   On: 04/21/2023 11:05    ED Course / MDM   Clinical Course as of 05/15/23 2142  Sat May 15, 2023  1459 Intermittent Dizzy [BH]  2137 Squamous Epithelial / HPF: 0-5 [BH]    Clinical Course User Index [BH] Ahlana Slaydon A, PA-C   Care assumed from previous provider.  See note for full HPI.  72 year old history of A-fib post TEE cardioversion last month here for evaluation intermittent dizziness over the last few days.  Lightheadedness, near syncope as well as some vision changes.  Labs and CT head without significant finding per previous provider.  She discussed with neurology, Dr. Otelia Limes who recommended MRI brain as well as MRA head and neck.  If negative patient can DC home.   Labs and imaging personally viewed and interpreted:  EKG without ischemic changes CT head without significant findings X-ray without significant findings CBC no leukocytosis UA negative for infection Delta troponin flat Metabolic panel potassium 3.2, calcium 7.2.  Potassium supplemented, Tums for calcium at home  MRI brain, MRA without significant abnormality  Unclear etiology of patient's symptoms.  Has been on the monitors here for over 9 hours without any arrhythmia.  She has a nonfocal neuroexam.  Will have her follow-up outpatient, return for new or worsening symptoms.  The patient has been appropriately medically screened and/or stabilized in the ED. I have low suspicion for any other emergent medical condition which would require  further screening, evaluation or treatment in the ED or require inpatient management.  Patient is hemodynamically stable and in no acute distress.  Patient able to ambulate in department prior to ED.  Evaluation does not show acute pathology that would require ongoing or additional emergent interventions while in the emergency department or further inpatient treatment.  I have discussed the diagnosis with the patient and answered all questions.  Pain is been managed while in the emergency department and patient has no further complaints prior to discharge.  Patient is comfortable with plan discussed in room and is stable for discharge at this time.  I have discussed strict return precautions for returning to the emergency department.  Patient was encouraged to follow-up with PCP/specialist refer to at discharge.     Medical Decision Making Amount and/or Complexity of Data Reviewed External Data Reviewed: labs, radiology, ECG and notes. Labs: ordered. Decision-making details documented in ED Course. Radiology: ordered and independent interpretation performed. Decision-making details documented in ED Course. ECG/medicine tests: ordered and independent interpretation performed. Decision-making details documented in ED Course.  Risk OTC drugs. Prescription drug management. Decision regarding hospitalization. Diagnosis or treatment significantly limited by social determinants of health.          Arvilla Salada A, PA-C 05/15/23 2142    Laurence Spates, MD 05/15/23 [phone]

## 2023-05-20 ENCOUNTER — Other Ambulatory Visit: Payer: Medicare Other

## 2023-05-25 ENCOUNTER — Ambulatory Visit: Payer: Medicare Other | Admitting: Neurology

## 2023-05-25 ENCOUNTER — Encounter: Payer: Self-pay | Admitting: Neurology

## 2023-05-25 VITALS — BP 110/66 | HR 73 | Ht 70.0 in | Wt 161.4 lb

## 2023-05-25 DIAGNOSIS — R42 Dizziness and giddiness: Secondary | ICD-10-CM | POA: Diagnosis not present

## 2023-05-25 DIAGNOSIS — G43109 Migraine with aura, not intractable, without status migrainosus: Secondary | ICD-10-CM

## 2023-05-25 DIAGNOSIS — H539 Unspecified visual disturbance: Secondary | ICD-10-CM | POA: Diagnosis not present

## 2023-05-25 DIAGNOSIS — G459 Transient cerebral ischemic attack, unspecified: Secondary | ICD-10-CM | POA: Diagnosis not present

## 2023-05-25 NOTE — Progress Notes (Signed)
Chief Complaint  Patient presents with   New Patient (Initial Visit)    Rm15, alone NP internal referral for Visual changes, Dizziness. Pt stated that she had vision problems, became dizzy, saw two visual sides at once, she has had 1 fainting spell and several dizziness spell.       ASSESSMENT AND PLAN  Griffyn Clothier is a 72 y.o. female   Transient double vision  Newly diagnosed atrial fibrillation  Most suggestive of TIA episode affecting midbrain,    MRI of the brain showed no acute abnormality, mild small vessel disease  MRA of the brain and neck was normal  Continue Eliquis 5 mg twice a day  Call clinic for recurrent symptoms Chronic migraine with aura  -Intermittently, not associated with headache  DIAGNOSTIC DATA (LABS, IMAGING, TESTING) - I reviewed patient records, labs, notes, testing and imaging myself where available.   MEDICAL HISTORY:  Katiana Cappiello, is a 72 year old female seen in request by her primary care doctor Asencion Partridge for evaluation of blurry vision, initial evaluation was on May 25, 2023  I reviewed and summarized the referring note. PMHX HLD HTN A fib Hypothryodism History of bilateral knee replacement Osteopenia  She had a hospital admission on April 14, 2023, she was taking a shower, then felt dizziness, passing out on the shower floor, in the emergency room heart EKG confirmed atrial fibrillation, with ventricular heart rate of 130s, she was started on Cardizem drip,  Echocardiogram showed normal ejection fraction, no wall motion abnormality, Started on Eliquis 5 mg twice a day  In mid August 2024, woke up in the morning, she felt dizziness, also double vision, "my eyes spit ",right eye see the counter on the right side, left I see the towel on the left side, symptoms last for few seconds, she blink her eyes, sat down, then her abnormal vision went away  May 15, 2023, again wake up in the morning, complains of dizziness, standing in  the counter, felt she is going to pass out, nauseous  There was also 1 episode she see kaleidoscope in 1 visual field, lasting for few minutes without headache, was seen by ophthalmologist for similar complaints in the past  Personally reviewed MRI of the brain May 15, 2023, no acute abnormality mild small vessel disease  MRA of head and neck showed no large vessel disease  Laboratory evaluations: Negative troponin, normal CBC, BMP, HIV, TSH was mildly decreased 0.24    PHYSICAL EXAM:   Vitals:   05/25/23 1043  BP: 110/66  Pulse: 73  Weight: 161 lb 6.4 oz (73.2 kg)  Height: 5\' 10"  (1.778 m)   Not recorded     Body mass index is 23.16 kg/m.  PHYSICAL EXAMNIATION:  Gen: NAD, conversant, well nourised, well groomed                     Cardiovascular: Regular rate rhythm, no peripheral edema, warm, nontender. Eyes: Conjunctivae clear without exudates or hemorrhage Neck: Supple, no carotid bruits. Pulmonary: Clear to auscultation bilaterally   NEUROLOGICAL EXAM:  MENTAL STATUS: Speech/cognition: Awake, alert, oriented to history taking and casual conversation CRANIAL NERVES: CN II: Visual fields are full to confrontation. Pupils are round equal and briskly reactive to light. CN III, IV, VI: extraocular movement are normal. No ptosis. CN V: Facial sensation is intact to light touch CN VII: Face is symmetric with normal eye closure  CN VIII: Hearing is normal to causal conversation. CN IX, X: Phonation is  normal. CN XI: Head turning and shoulder shrug are intact  MOTOR: There is no pronator drift of out-stretched arms. Muscle bulk and tone are normal. Muscle strength is normal.  REFLEXES: Reflexes are 2+ and symmetric at the biceps, triceps, knees, and ankles. Plantar responses are flexor.  SENSORY: Intact to light touch, pinprick and vibratory sensation are intact in fingers and toes.  COORDINATION: There is no trunk or limb dysmetria  noted.  GAIT/STANCE: Posture is normal. Gait is steady with normal steps, base, arm swing, and turning. Heel and toe walking are normal. Tandem gait is normal.  Romberg is absent.  REVIEW OF SYSTEMS:  Full 14 system review of systems performed and notable only for as above All other review of systems were negative.   ALLERGIES: No Known Allergies  HOME MEDICATIONS: Current Outpatient Medications  Medication Sig Dispense Refill   apixaban (ELIQUIS) 5 MG TABS tablet Take 1 tablet (5 mg total) by mouth 2 (two) times daily. 180 tablet 3   diltiazem (CARDIZEM CD) 120 MG 24 hr capsule Take 1 capsule (120 mg total) by mouth daily. 90 capsule 3   ezetimibe (ZETIA) 10 MG tablet Take 1 tablet (10 mg total) by mouth daily. 90 tablet 3   levothyroxine (SYNTHROID) 88 MCG tablet Take 1 tablet (88 mcg total) by mouth daily before breakfast. 90 tablet 0   metoprolol succinate (TOPROL-XL) 25 MG 24 hr tablet Take 1 tablet (25 mg total) by mouth at bedtime. 90 tablet 3   rosuvastatin (CRESTOR) 40 MG tablet Take 1 tablet (40 mg total) by mouth daily. 90 tablet 3   No current facility-administered medications for this visit.    PAST MEDICAL HISTORY: Past Medical History:  Diagnosis Date   Atrial fibrillation with RVR (HCC) 04/14/2023   Grade I diastolic dysfunction 04/14/2023   Heart murmur, systolic 01/02/2020   History of bilateral knee replacement 01/02/2020   Hypothyroidism    Mixed hyperlipidemia 04/01/2021   ASCVD 8.3% 03/2021; offered statin   Osteoarthritis of knee    Osteopenia after menopause 04/01/2020   DEXA July 2020: Lowest T score equals -1.9    PAST SURGICAL HISTORY: Past Surgical History:  Procedure Laterality Date   CARDIOVERSION N/A 04/15/2023   Procedure: CARDIOVERSION;  Surgeon: Meriam Sprague, MD;  Location: [facility] INVASIVE CV LAB;  Service: Cardiovascular;  Laterality: N/A;   CATARACT EXTRACTION W/ INTRAOCULAR LENS IMPLANT  2022   S/P Knee replacement   03/2015    TEE WITHOUT CARDIOVERSION N/A 04/15/2023   Procedure: TRANSESOPHAGEAL ECHOCARDIOGRAM;  Surgeon: Meriam Sprague, MD;  Location[facility] INVASIVE CV LAB;  Service: Cardiovascular;  Laterality: N/A;    FAMILY HISTORY: Family History  Problem Relation Age of Onset   Alzheimer's disease Mother    Heart attack Father    Hypertension Father    Cancer Father        Skin   Coronary artery disease Father     SOCIAL HISTORY: Social History   Socioeconomic History   Marital status: Divorced    Spouse name: Not on file   Number of children: 1   Years of education: Not on file   Highest education level: Bachelor's degree (e.g., BA, AB, BS)  Occupational History   Not on file  Tobacco Use   Smoking status: Never   Smokeless tobacco: Never  Vaping Use   Vaping status: Never Used  Substance and Sexual Activity   Alcohol use: Yes    Comment: Rare   Drug use: Never  Sexual activity: Yes  Other Topics Concern   Not on file  Social History Narrative   1 daughter.1 grand dau   Retired Systems analyst   Social Determinants of Corporate investment banker Strain: Low Risk  (04/01/2023)   Overall Financial Resource Strain (CARDIA)    Difficulty of Paying Living Expenses: Not hard at all  Food Insecurity: No Food Insecurity (04/14/2023)   Hunger Vital Sign    Worried About Running Out of Food in the Last Year: Never true    Ran Out of Food in the Last Year: Never true  Transportation Needs: No Transportation Needs (04/14/2023)   PRAPARE - Administrator, Civil Service (Medical): No    Lack of Transportation (Non-Medical): No  Physical Activity: Inactive (04/01/2023)   Exercise Vital Sign    Days of Exercise per Week: 0 days    Minutes of Exercise per Session: 30 min  Stress: No Stress Concern Present (04/01/2023)   [employer] of Occupational Health - Occupational Stress Questionnaire    Feeling of Stress : Only a little  Social Connections: Socially Isolated  (04/01/2023)   Social Connection and Isolation Panel [NHANES]    Frequency of Communication with Friends and Family: Once a week    Frequency of Social Gatherings with Friends and Family: Once a week    Attends Religious Services: Never    Database administrator or Organizations: No    Attends Banker Meetings: Never    Marital Status: Divorced  Catering manager Violence: Not At Risk (04/14/2023)   Humiliation, Afraid, Rape, and Kick questionnaire    Fear of Current or Ex-Partner: No    Emotionally Abused: No    Physically Abused: No    Sexually Abused: No      Levert Feinstein, M.D. Ph.D.  [facility] [address] [place], [place] [address] Ph: [phone] Fax: [phone]  CC:  Henderly, Britni A, PA-C [address] [place],  [place] [address]  Willow Ora, MD

## 2023-05-26 ENCOUNTER — Other Ambulatory Visit (INDEPENDENT_AMBULATORY_CARE_PROVIDER_SITE_OTHER): Payer: Medicare Other

## 2023-05-26 DIAGNOSIS — E039 Hypothyroidism, unspecified: Secondary | ICD-10-CM | POA: Diagnosis not present

## 2023-05-26 LAB — TSH: TSH: 11.48 u[IU]/mL — ABNORMAL HIGH (ref 0.35–5.50)

## 2023-07-06 ENCOUNTER — Other Ambulatory Visit: Payer: Self-pay | Admitting: *Deleted

## 2023-07-06 ENCOUNTER — Telehealth: Payer: Self-pay | Admitting: *Deleted

## 2023-07-06 DIAGNOSIS — E039 Hypothyroidism, unspecified: Secondary | ICD-10-CM

## 2023-07-06 NOTE — Telephone Encounter (Addendum)
Pt stopped by the office this afternoon, she stated that she sis confused to what dose of  Levothyroxine she should be taking since she got another RX of 100 mcg at the pharmacy. Per chart review/Lab results 05/26/23, Dr. Mardelle Matte recommended to lower dose from 112 mcg to 100 mcg and repeat labs. Advised pt of Dr. Modesta Messing note and schedule her for lab visit to repeat TSH level lab. Order placed. Appointment scheduled for 07/07/23.

## 2023-07-07 ENCOUNTER — Other Ambulatory Visit (INDEPENDENT_AMBULATORY_CARE_PROVIDER_SITE_OTHER): Payer: Medicare Other

## 2023-07-07 ENCOUNTER — Other Ambulatory Visit: Payer: Self-pay | Admitting: Cardiology

## 2023-07-07 DIAGNOSIS — E039 Hypothyroidism, unspecified: Secondary | ICD-10-CM | POA: Diagnosis not present

## 2023-07-07 DIAGNOSIS — R002 Palpitations: Secondary | ICD-10-CM

## 2023-07-07 DIAGNOSIS — I480 Paroxysmal atrial fibrillation: Secondary | ICD-10-CM

## 2023-07-07 LAB — TSH: TSH: 9.68 u[IU]/mL — ABNORMAL HIGH (ref 0.35–5.50)

## 2023-07-08 ENCOUNTER — Telehealth: Payer: Self-pay | Admitting: Cardiology

## 2023-07-08 NOTE — Telephone Encounter (Signed)
Pt c/o medication issue:  1. Name of Medication:   All medication  2. How are you currently taking this medication (dosage and times per day)?   As prescribed  3. Are you having a reaction (difficulty breathing--STAT)?   No  4. What is your medication issue?   Patient wants a call back to confirm if she still needs to continue on her medications.  Patient wants to know if she will need to do lab work prior to getting refills.

## 2023-07-08 NOTE — Telephone Encounter (Signed)
Spoke to patient and advised her all prescriptions were sent to her pharmacy on 8/14. Patient will call pharmacy and if there's any issues she will reach back out to the office.

## 2023-07-12 ENCOUNTER — Ambulatory Visit (INDEPENDENT_AMBULATORY_CARE_PROVIDER_SITE_OTHER): Payer: Medicare Other | Admitting: Family Medicine

## 2023-07-12 ENCOUNTER — Encounter: Payer: Self-pay | Admitting: Family Medicine

## 2023-07-12 VITALS — BP 112/88 | HR 86 | Temp 98.7°F | Ht 70.0 in | Wt 169.0 lb

## 2023-07-12 DIAGNOSIS — I4891 Unspecified atrial fibrillation: Secondary | ICD-10-CM

## 2023-07-12 DIAGNOSIS — E039 Hypothyroidism, unspecified: Secondary | ICD-10-CM

## 2023-07-12 NOTE — Progress Notes (Signed)
Subjective  CC:  Chief Complaint  Patient presents with   Hypothyroidism    HPI: Tina Dalton is a 72 y.o. female who presents to the office today to address the problems listed above in the chief complaint.  Hypothyroidism f/u: [name] is a 72 y.o. female who presents for follow up of hypothyroidism. See last note; we were adjusting down her synthroid dose, but hospital d/c'd her on instead of . Now with persistently high TSH. Feels fine. I reviewed all notes and labs. Her weight is down considerably from a a year ago. She was hyperthyroid when she had her afib/syncope.    Lab Results  Component Value Date   TSH 9.68 (H) 07/07/2023   Wt Readings from Last 3 Encounters:  07/12/23 169 lb (76.7 kg)  05/25/23 161 lb 6.4 oz (73.2 kg)  05/15/23 175 lb (79.4 kg)  11/2022:  168lb 05/2022:  189lb  Assessment  1. Acquired hypothyroidism   2. Atrial fibrillation with RVR (HCC)      Plan  Hypothyroidism:  reviewed above with patient. To stop the and start daily. Will recheck tsh in 6-8 weeks.  S/p cardioversion and w/o palpitations. On cardizem and eloquis.  Imms up to date: had flu vaccine and covid vaccine 2 weeks ago.  Follow up: 6-8 weeks for lab visit.  07/20/2023  No orders of the defined types were placed in this encounter.  No orders of the defined types were placed in this encounter.     I reviewed the patients updated PMH, FH, and SocHx.    Patient Active Problem List   Diagnosis Date Noted   TIA (transient ischemic attack) 05/25/2023    Priority: High   Atrial fibrillation with RVR (HCC) 04/14/2023    Priority: High   Palpitations 04/02/2023    Priority: High   Coronary artery calcification 04/02/2023    Priority: High   Mixed hyperlipidemia 04/01/2021    Priority: High   Acquired hypothyroidism 11/17/2017    Priority: High   Prehypertension 11/17/2017    Priority: High   Migraine with aura and without status migrainosus, not  intractable 05/25/2023    Priority: Medium    Grade I diastolic dysfunction 04/14/2023    Priority: Medium    Osteopenia after menopause 04/01/2020    Priority: Medium    Heart murmur, systolic 01/02/2020    Priority: Low   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   diltiazem (CARDIZEM CD) 120 MG 24 hr capsule Take 1 capsule (120 mg total) by mouth daily.   ezetimibe (ZETIA) 10 MG tablet Take 1 tablet (10 mg total) by mouth daily.   levothyroxine (SYNTHROID) 100 MCG tablet Take 100 mcg by mouth daily before breakfast.   metoprolol succinate (TOPROL-XL) 25 MG 24 hr tablet TAKE 1 TABLET(25 MG) BY MOUTH AT BEDTIME   rosuvastatin (CRESTOR) 40 MG tablet Take 1 tablet (40 mg total) by mouth daily.    Allergies: Patient has No Known Allergies. Family History: Patient family history includes Alzheimer's disease in her mother; Cancer in her father; Coronary artery disease in her father; Heart attack in her father; Hypertension in her  father. Social History:  Patient  reports that she has never smoked. She has never used smokeless tobacco. She reports current alcohol use. She reports that she does not use drugs.  Review of Systems: Constitutional: Negative for fever malaise or anorexia Cardiovascular: negative for chest pain Respiratory: negative for SOB or persistent cough Gastrointestinal: negative for abdominal pain  Objective  Vitals: BP 112/88   Pulse 86   Temp 98.7 F (37.1 C)   Ht 5\' 10"  (1.778 m)   Wt 169 lb (76.7 kg)   LMP  (LMP Unknown)   SpO2 98%   BMI 24.25 kg/m  General: no acute distress , A&Ox3 Cardiovascular:  RRR without murmur or gallop. No peripheral edema Neuro: no  tremor    Commons side effects, risks, benefits, and alternatives for medications and treatment plan prescribed today were discussed, and the patient expressed understanding of the given instructions. Patient is instructed to call or message via MyChart if he/she has any questions or concerns regarding our treatment plan. No barriers to understanding were identified. We discussed Red Flag symptoms and signs in detail. Patient expressed understanding regarding what to do in case of urgent or emergency type symptoms.  Medication list was reconciled, printed and provided to the patient in AVS. Patient instructions and summary information was reviewed with the patient as documented in the AVS. This note was prepared with assistance of Dragon voice recognition software. Occasional wrong-word or sound-a-like substitutions may have occurred due to the inherent limitations of voice recognition software

## 2023-07-20 ENCOUNTER — Ambulatory Visit (INDEPENDENT_AMBULATORY_CARE_PROVIDER_SITE_OTHER): Payer: Medicare Other

## 2023-07-20 VITALS — Wt 169.0 lb

## 2023-07-20 DIAGNOSIS — Z0000 Encounter for general adult medical examination without abnormal findings: Secondary | ICD-10-CM

## 2023-07-20 NOTE — Patient Instructions (Addendum)
Tina Dalton , Thank you for taking time to come for your Medicare Wellness Visit. I appreciate your ongoing commitment to your health goals. Please review the following plan we discussed and let me know if I can assist you in the future.   Referrals/Orders/Follow-Ups/Clinician Recommendations: Aim for 30 minutes of exercise or brisk walking, 6-8 glasses of water, and 5 servings of fruits and vegetables each day.  Encourage Eye exams & tests to be performed for maximum eye health   Each day, aim for 6 glasses of water, plenty of protein in your diet and try to get up and walk/ stretch every hour for 5-10 minutes at a time.      This is a list of the screening recommended for you and due dates:  Health Maintenance  Topic Date Due   DEXA scan (bone density measurement)  03/15/2022   COVID-19 Vaccine (6 - 2023-24 season) 08/26/2023   Mammogram  04/19/2024   Cologuard (Stool DNA test)  04/21/2024   Medicare Annual Wellness Visit  07/19/2024   DTaP/Tdap/Td vaccine (3 - Td or Tdap) 12/02/2030   Pneumonia Vaccine  Completed   Flu Shot  Completed   Hepatitis C Screening  Completed   Zoster (Shingles) Vaccine  Completed   HPV Vaccine  Aged Out    Advanced directives: (In Chart) A copy of your advanced directives are scanned into your chart should your provider ever need it.  Next Medicare Annual Wellness Visit scheduled for next year: Yes

## 2023-07-20 NOTE — Progress Notes (Signed)
Subjective:   Tina Dalton is a 72 y.o. female who presents for Medicare Annual (Subsequent) preventive examination.  Visit Complete: Virtual I connected with  Tina Dalton on 07/20/23 by a audio enabled telemedicine application and verified that I am speaking with the correct person using two identifiers.  Patient Location: Home  Provider Location: Office/Clinic  I discussed the limitations of evaluation and management by telemedicine. The patient expressed understanding and agreed to proceed.  Vital Signs: Because this visit was a virtual/telehealth visit, some criteria may be missing or patient reported. Any vitals not documented were not able to be obtained and vitals that have been documented are patient reported.      Objective:    Today's Vitals   07/20/23 0803  Weight: 169 lb (76.7 kg)   Body mass index is 24.25 kg/m.     07/20/2023    8:06 AM 05/15/2023   12:20 PM 04/14/2023   12:30 PM 04/14/2023    7:38 AM 07/16/2022    8:34 AM 03/30/2022   12:28 PM 04/03/2021    1:46 PM  Advanced Directives  Does Patient Have a Medical Advance Directive? Yes No Yes No Yes Yes Yes  Type of Estate agent of [place];Living will  Healthcare Power of [employer] of [place];Living will Healthcare Power of [place];Living will Healthcare Power of Attorney  Does patient want to make changes to medical advance directive? No - Patient declined  No - Patient declined  No - Patient declined    Copy of Healthcare Power of Attorney in Chart? Yes - validated most recent copy scanned in chart (See row information)  No - copy requested  Yes - validated most recent copy scanned in chart (See row information) No - copy requested Yes - validated most recent copy scanned in chart (See row information)    Current Medications (verified) Outpatient Encounter Medications as of 07/20/2023  Medication Sig   apixaban (ELIQUIS) 5 MG TABS tablet Take 1 tablet (5 mg total) by  mouth 2 (two) times daily.   diltiazem (CARDIZEM CD) 120 MG 24 hr capsule Take 1 capsule (120 mg total) by mouth daily.   ezetimibe (ZETIA) 10 MG tablet Take 1 tablet (10 mg total) by mouth daily.   levothyroxine (SYNTHROID) 100 MCG tablet Take 100 mcg by mouth daily before breakfast.   metoprolol succinate (TOPROL-XL) 25 MG 24 hr tablet TAKE 1 TABLET(25 MG) BY MOUTH AT BEDTIME   rosuvastatin (CRESTOR) 40 MG tablet Take 1 tablet (40 mg total) by mouth daily.   No facility-administered encounter medications on file as of 07/20/2023.    Allergies (verified) Patient has no known allergies.   History: Past Medical History:  Diagnosis Date   Atrial fibrillation with RVR (HCC) 04/14/2023   Grade I diastolic dysfunction 04/14/2023   Heart murmur, systolic 01/02/2020   History of bilateral knee replacement 01/02/2020   Hypothyroidism    Mixed hyperlipidemia 04/01/2021   ASCVD 8.3% 03/2021; offered statin   Osteoarthritis of knee    Osteopenia after menopause 04/01/2020   DEXA July 2020: Lowest T score equals -1.9   Past Surgical History:  Procedure Laterality Date   CARDIOVERSION N/A 04/15/2023   Procedure: CARDIOVERSION;  Surgeon: Meriam Sprague, MD;  Location: [facility] INVASIVE CV LAB;  Service: Cardiovascular;  Laterality: N/A;   CATARACT EXTRACTION W/ INTRAOCULAR LENS IMPLANT  2022   S/P Knee replacement   03/2015   TEE WITHOUT CARDIOVERSION N/A 04/15/2023   Procedure: TRANSESOPHAGEAL ECHOCARDIOGRAM;  Surgeon: Meriam Sprague, MD;  Location: [facility] INVASIVE CV LAB;  Service: Cardiovascular;  Laterality: N/A;   Family History  Problem Relation Age of Onset   Alzheimer's disease Mother    Heart attack Father    Hypertension Father    Cancer Father        Skin   Coronary artery disease Father    Social History   Socioeconomic History   Marital status: Divorced    Spouse name: Not on file   Number of children: 1   Years of education: Not on file   Highest education level:  Bachelor's degree (e.g., BA, AB, BS)  Occupational History   Not on file  Tobacco Use   Smoking status: Never   Smokeless tobacco: Never  Vaping Use   Vaping status: Never Used  Substance and Sexual Activity   Alcohol use: Yes    Comment: Rare   Drug use: Never   Sexual activity: Yes  Other Topics Concern   Not on file  Social History Narrative   1 daughter.1 grand dau   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 Strain: Low Risk  (07/20/2023)   Overall Financial Resource Strain (CARDIA)    Difficulty of Paying Living Expenses: Not hard at all  Food Insecurity: No Food Insecurity (07/20/2023)   Hunger Vital Sign    Worried About Running Out of Food in the Last Year: Never true    Ran Out of Food in the Last Year: Never true  Transportation Needs: No Transportation Needs (07/20/2023)   PRAPARE - Administrator, Civil Service (Medical): No    Lack of Transportation (Non-Medical): No  Physical Activity: Inactive (07/20/2023)   Exercise Vital Sign    Days of Exercise per Week: 0 days    Minutes of Exercise per Session: 0 min  Stress: No Stress Concern Present (07/20/2023)   [employer] of Occupational Health - Occupational Stress Questionnaire    Feeling of Stress : Not at all  Social Connections: Socially Isolated (07/20/2023)   Social Connection and Isolation Panel [NHANES]    Frequency of Communication with Friends and Family: More than three times a week    Frequency of Social Gatherings with Friends and Family: Once a week    Attends Religious Services: Never    Database administrator or Organizations: No    Attends Engineer, structural: Never    Marital Status: Divorced    Tobacco Counseling Counseling given: Not Answered   Clinical Intake:  Pre-visit preparation completed: Yes  Pain : No/denies pain     BMI - recorded: 24.25 Nutritional Status: BMI of 19-24  Normal Diabetes: No  How often  do you need to have someone help you when you read instructions, pamphlets, or other written materials from your doctor or pharmacy?: 1 - Never  Interpreter Needed?: No  Information entered by :: Lanier Ensign, LPN   Activities of Daily Living    07/20/2023   10:16 AM 04/15/2023   12:42 PM  In your present state of health, do you have any difficulty performing the following activities:  Hearing? 0 0  Vision? 0 0  Difficulty concentrating or making decisions? 0 0  Walking or climbing stairs? 0 0  Dressing or bathing? 0 0  Doing errands, shopping? 0   Preparing Food and eating ? N   Using the Toilet? N   In the past six months, have you accidently leaked  urine? N   Do you have problems with loss of bowel control? N   Managing your Medications? N   Managing your Finances? N   Housekeeping or managing your Housekeeping? N     Patient Care Team: Willow Ora, MD as PCP - General (Family Medicine) Jens Som Madolyn Frieze, MD as PCP - Cardiology (Cardiology) Duke, Roe Rutherford, PA as Physician Assistant (Cardiology) Levert Feinstein, MD as Consulting Physician (Neurology)  Indicate any recent Medical Services you may have received from other than [facility] providers in the past year (date may be approximate).     Assessment:   This is a routine wellness examination for Aleesia.  Hearing/Vision screen Hearing Screening - Comments:: Pt denies any hearing issues  Vision Screening - Comments:: Encouraged to follow up with provider    Goals Addressed   None    Depression Screen    07/20/2023    8:05 AM 07/12/2023    9:01 AM 04/22/2023   10:27 AM 04/02/2023    8:43 AM 04/02/2023    8:40 AM 07/16/2022    8:36 AM 05/08/2022    9:49 AM  PHQ 2/9 Scores  PHQ - 2 Score 0 0 0 1 0 0 0    Fall Risk    07/20/2023    8:07 AM 07/12/2023    9:01 AM 04/22/2023   10:25 AM 04/02/2023    8:40 AM 07/16/2022    8:38 AM  Fall Risk   Falls in the past year? 0 0 1 0 0  Number falls in past yr: 0 0 1 0 0   Injury with Fall? 0 0 0 0 0  Risk for fall due to : No Fall Risks No Fall Risks No Fall Risks No Fall Risks No Fall Risks;Impaired vision  Follow up Falls prevention discussed Falls evaluation completed Falls evaluation completed Falls evaluation completed Falls prevention discussed    MEDICARE RISK AT HOME: Medicare Risk at Home Any stairs in or around the home?: Yes If so, are there any without handrails?: No Home free of loose throw rugs in walkways, pet beds, electrical cords, etc?: Yes Adequate lighting in your home to reduce risk of falls?: Yes Life alert?: No Use of a cane, walker or w/c?: No Grab bars in the bathroom?: No Shower chair or bench in shower?: Yes Elevated toilet seat or a handicapped toilet?: No  TIMED UP AND GO:  Was the test performed?  No    Cognitive Function:        07/20/2023    8:07 AM 07/16/2022    8:38 AM 04/03/2021    1:52 PM 04/01/2020    1:27 PM  6CIT Screen  What Year? 0 points 0 points 0 points 0 points  What month? 0 points 0 points 0 points 0 points  What time? 0 points 0 points 0 points   Count back from 20 0 points 0 points 0 points 0 points  Months in reverse 0 points 0 points 0 points 0 points  Repeat phrase 0 points 0 points 0 points 0 points  Total Score 0 points 0 points 0 points     Immunizations Immunization History  Administered Date(s) Administered   Fluad Quad(high Dose 65+) 05/24/2019, 08/06/2021   Fluad Trivalent(High Dose 65+) 07/01/2023   Influenza, High Dose Seasonal PF 06/17/2018   Influenza,inj,Quad PF,6+ Mos 10/06/2017   Influenza-Unspecified 06/17/2022   PFIZER(Purple Top)SARS-COV-2 Vaccination 10/11/2019, 11/01/2019, 06/22/2020, 12/27/2020   Pfizer Covid-19 Vaccine Bivalent Booster 12yrs & up 07/01/2023  Pneumococcal Conjugate-13 01/02/2020   Pneumococcal Polysaccharide-23 04/23/2021   Respiratory Syncytial Virus Vaccine,Recomb Aduvanted(Arexvy) 06/17/2022   Tdap 11/17/2017, 12/01/2020   Zoster  Recombinant(Shingrix) 05/30/2021, 07/25/2021    TDAP status: Up to date  Flu Vaccine status: Up to date  Pneumococcal vaccine status: Up to date  Covid-19 vaccine status: Information provided on how to obtain vaccines.   Qualifies for Shingles Vaccine? Yes   Zostavax completed Yes   Shingrix Completed?: Yes  Screening Tests Health Maintenance  Topic Date Due   DEXA SCAN  03/15/2022   COVID-19 Vaccine (6 - 2023-24 season) 08/26/2023   MAMMOGRAM  04/19/2024   Fecal DNA (Cologuard)  04/21/2024   Medicare Annual Wellness (AWV)  07/19/2024   DTaP/Tdap/Td (3 - Td or Tdap) 12/02/2030   Pneumonia Vaccine 58+ Years old  Completed   INFLUENZA VACCINE  Completed   Hepatitis C Screening  Completed   Zoster Vaccines- Shingrix  Completed   HPV VACCINES  Aged Out    Health Maintenance  Health Maintenance Due  Topic Date Due   DEXA SCAN  03/15/2022    Colorectal cancer screening: Type of screening: Cologuard. Completed 04/21/21. Repeat every 3 years  Mammogram status: Completed 04/20/23. Repeat every year  Bone scan scheduled for 10/18/23  Additional Screening:  Hepatitis C Screening:  Completed 06/17/18  Vision Screening: Recommended annual ophthalmology exams for early detection of glaucoma and other disorders of the eye. Is the patient up to date with their annual eye exam?  No  Who is the provider or what is the name of the office in which the patient attends annual eye exams? Encourage to follow up with provider for eye exams  If pt is not established with a provider, would they like to be referred to a provider to establish care? No .   Dental Screening: Recommended annual dental exams for proper oral hygiene  Community Resource Referral / Chronic Care Management: CRR required this visit?  No   CCM required this visit?  No     Plan:     I have personally reviewed and noted the following in the patient's chart:   Medical and social history Use of alcohol, tobacco  or illicit drugs  Current medications and supplements including opioid prescriptions. Patient is not currently taking opioid prescriptions. Functional ability and status Nutritional status Physical activity Advanced directives List of other physicians Hospitalizations, surgeries, and ER visits in previous 12 months Vitals Screenings to include cognitive, depression, and falls Referrals and appointments  In addition, I have reviewed and discussed with patient certain preventive protocols, quality metrics, and best practice recommendations. A written personalized care plan for preventive services as well as general preventive health recommendations were provided to patient.     [name], LPN   [id number]   After Visit Summary: (MyChart) Due to this being a telephonic visit, the after visit summary with patients personalized plan was offered to patient via MyChart   Nurse Notes: none

## 2023-07-20 NOTE — Progress Notes (Signed)
Subjective:   Tina Dalton is a 72 y.o. female who presents for Medicare Annual (Subsequent) preventive examination.  Visit Complete: Virtual I connected with  Donalda Dalton on 07/20/23 by a audio enabled telemedicine application and verified that I am speaking with the correct person using two identifiers.  Patient Location: Home  Provider Location: Office/Clinic  I discussed the limitations of evaluation and management by telemedicine. The patient expressed understanding and agreed to proceed.  Vital Signs: Because this visit was a virtual/telehealth visit, some criteria may be missing or patient reported. Any vitals not documented were not able to be obtained and vitals that have been documented are patient reported.      Objective:    Today's Vitals   07/20/23 0803  Weight: 169 lb (76.7 kg)   Body mass index is 24.25 kg/m.     07/20/2023    8:06 AM 05/15/2023   12:20 PM 04/14/2023   12:30 PM 04/14/2023    7:38 AM 07/16/2022    8:34 AM 03/30/2022   12:28 PM 04/03/2021    1:46 PM  Advanced Directives  Does Patient Have a Medical Advance Directive? Yes No Yes No Yes Yes Yes  Type of Estate agent of [place];Living will  Healthcare Power of [employer] of [place];Living will Healthcare Power of [place];Living will Healthcare Power of Attorney  Does patient want to make changes to medical advance directive? No - Patient declined  No - Patient declined  No - Patient declined    Copy of Healthcare Power of Attorney in Chart? Yes - validated most recent copy scanned in chart (See row information)  No - copy requested  Yes - validated most recent copy scanned in chart (See row information) No - copy requested Yes - validated most recent copy scanned in chart (See row information)    Current Medications (verified) Outpatient Encounter Medications as of 07/20/2023  Medication Sig   apixaban (ELIQUIS) 5 MG TABS tablet Take 1 tablet (5 mg total) by  mouth 2 (two) times daily.   diltiazem (CARDIZEM CD) 120 MG 24 hr capsule Take 1 capsule (120 mg total) by mouth daily.   ezetimibe (ZETIA) 10 MG tablet Take 1 tablet (10 mg total) by mouth daily.   levothyroxine (SYNTHROID) 100 MCG tablet Take 100 mcg by mouth daily before breakfast.   metoprolol succinate (TOPROL-XL) 25 MG 24 hr tablet TAKE 1 TABLET(25 MG) BY MOUTH AT BEDTIME   rosuvastatin (CRESTOR) 40 MG tablet Take 1 tablet (40 mg total) by mouth daily.   No facility-administered encounter medications on file as of 07/20/2023.    Allergies (verified) Patient has no known allergies.   History: Past Medical History:  Diagnosis Date   Atrial fibrillation with RVR (HCC) 04/14/2023   Grade I diastolic dysfunction 04/14/2023   Heart murmur, systolic 01/02/2020   History of bilateral knee replacement 01/02/2020   Hypothyroidism    Mixed hyperlipidemia 04/01/2021   ASCVD 8.3% 03/2021; offered statin   Osteoarthritis of knee    Osteopenia after menopause 04/01/2020   DEXA July 2020: Lowest T score equals -1.9   Past Surgical History:  Procedure Laterality Date   CARDIOVERSION N/A 04/15/2023   Procedure: CARDIOVERSION;  Surgeon: Meriam Sprague, MD;  Location: [facility] INVASIVE CV LAB;  Service: Cardiovascular;  Laterality: N/A;   CATARACT EXTRACTION W/ INTRAOCULAR LENS IMPLANT  2022   S/P Knee replacement   03/2015   TEE WITHOUT CARDIOVERSION N/A 04/15/2023   Procedure: TRANSESOPHAGEAL ECHOCARDIOGRAM;  Surgeon: Meriam Sprague, MD;  Location: [facility] INVASIVE CV LAB;  Service: Cardiovascular;  Laterality: N/A;   Family History  Problem Relation Age of Onset   Alzheimer's disease Mother    Heart attack Father    Hypertension Father    Cancer Father        Skin   Coronary artery disease Father    Social History   Socioeconomic History   Marital status: Divorced    Spouse name: Not on file   Number of children: 1   Years of education: Not on file   Highest education level:  Bachelor's degree (e.g., BA, AB, BS)  Occupational History   Not on file  Tobacco Use   Smoking status: Never   Smokeless tobacco: Never  Vaping Use   Vaping status: Never Used  Substance and Sexual Activity   Alcohol use: Yes    Comment: Rare   Drug use: Never   Sexual activity: Yes  Other Topics Concern   Not on file  Social History Narrative   1 daughter.1 grand dau   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 Strain: Low Risk  (07/20/2023)   Overall Financial Resource Strain (CARDIA)    Difficulty of Paying Living Expenses: Not hard at all  Food Insecurity: No Food Insecurity (07/20/2023)   Hunger Vital Sign    Worried About Running Out of Food in the Last Year: Never true    Ran Out of Food in the Last Year: Never true  Transportation Needs: No Transportation Needs (07/20/2023)   PRAPARE - Administrator, Civil Service (Medical): No    Lack of Transportation (Non-Medical): No  Physical Activity: Inactive (07/20/2023)   Exercise Vital Sign    Days of Exercise per Week: 0 days    Minutes of Exercise per Session: 0 min  Stress: No Stress Concern Present (07/20/2023)   [employer] of Occupational Health - Occupational Stress Questionnaire    Feeling of Stress : Not at all  Social Connections: Socially Isolated (07/20/2023)   Social Connection and Isolation Panel [NHANES]    Frequency of Communication with Friends and Family: More than three times a week    Frequency of Social Gatherings with Friends and Family: Once a week    Attends Religious Services: Never    Database administrator or Organizations: No    Attends Engineer, structural: Never    Marital Status: Divorced    Tobacco Counseling Counseling given: Not Answered   Clinical Intake:  Pre-visit preparation completed: Yes  Pain : No/denies pain     BMI - recorded: 24.25 Nutritional Status: BMI of 19-24  Normal Diabetes: No  How often  do you need to have someone help you when you read instructions, pamphlets, or other written materials from your doctor or pharmacy?: 1 - Never  Interpreter Needed?: No  Information entered by :: Lanier Ensign, LPN   Activities of Daily Living    04/15/2023   12:42 PM 04/14/2023   12:30 PM  In your present state of health, do you have any difficulty performing the following activities:  Hearing? 0 0  Vision? 0 0  Difficulty concentrating or making decisions? 0 0  Walking or climbing stairs? 0 0  Dressing or bathing? 0 0  Doing errands, shopping?  0    Patient Care Team: Willow Ora, MD as PCP - General (Family Medicine) Jens Som Madolyn Frieze, MD as PCP - Cardiology (  Cardiology) Duke, Roe Rutherford, PA as Physician Assistant (Cardiology) Levert Feinstein, MD as Consulting Physician (Neurology)  Indicate any recent Medical Services you may have received from other than [facility] providers in the past year (date may be approximate).     Assessment:   This is a routine wellness examination for Danine.  Hearing/Vision screen Hearing Screening - Comments:: Pt denies any hearing issues  Vision Screening - Comments:: Encouraged to follow up with provider    Goals Addressed   None    Depression Screen    07/20/2023    8:05 AM 07/12/2023    9:01 AM 04/22/2023   10:27 AM 04/02/2023    8:43 AM 04/02/2023    8:40 AM 07/16/2022    8:36 AM 05/08/2022    9:49 AM  PHQ 2/9 Scores  PHQ - 2 Score 0 0 0 1 0 0 0    Fall Risk    07/20/2023    8:07 AM 07/12/2023    9:01 AM 04/22/2023   10:25 AM 04/02/2023    8:40 AM 07/16/2022    8:38 AM  Fall Risk   Falls in the past year? 0 0 1 0 0  Number falls in past yr: 0 0 1 0 0  Injury with Fall? 0 0 0 0 0  Risk for fall due to : No Fall Risks No Fall Risks No Fall Risks No Fall Risks No Fall Risks;Impaired vision  Follow up Falls prevention discussed Falls evaluation completed Falls evaluation completed Falls evaluation completed Falls prevention  discussed    MEDICARE RISK AT HOME: Medicare Risk at Home Any stairs in or around the home?: Yes If so, are there any without handrails?: No Home free of loose throw rugs in walkways, pet beds, electrical cords, etc?: Yes Adequate lighting in your home to reduce risk of falls?: Yes Life alert?: No Use of a cane, walker or w/c?: No Grab bars in the bathroom?: No Shower chair or bench in shower?: Yes Elevated toilet seat or a handicapped toilet?: No  TIMED UP AND GO:  Was the test performed?  No    Cognitive Function:        07/20/2023    8:07 AM 07/16/2022    8:38 AM 04/03/2021    1:52 PM 04/01/2020    1:27 PM  6CIT Screen  What Year? 0 points 0 points 0 points 0 points  What month? 0 points 0 points 0 points 0 points  What time? 0 points 0 points 0 points   Count back from 20 0 points 0 points 0 points 0 points  Months in reverse 0 points 0 points 0 points 0 points  Repeat phrase 0 points 0 points 0 points 0 points  Total Score 0 points 0 points 0 points     Immunizations Immunization History  Administered Date(s) Administered   Fluad Quad(high Dose 65+) 05/24/2019, 08/06/2021   Fluad Trivalent(High Dose 65+) 07/01/2023   Influenza, High Dose Seasonal PF 06/17/2018   Influenza,inj,Quad PF,6+ Mos 10/06/2017   Influenza-Unspecified 06/17/2022   PFIZER(Purple Top)SARS-COV-2 Vaccination 10/11/2019, 11/01/2019, 06/22/2020, 12/27/2020   Pfizer Covid-19 Vaccine Bivalent Booster 54yrs & up 07/01/2023   Pneumococcal Conjugate-13 01/02/2020   Pneumococcal Polysaccharide-23 04/23/2021   Respiratory Syncytial Virus Vaccine,Recomb Aduvanted(Arexvy) 06/17/2022   Tdap 11/17/2017, 12/01/2020   Zoster Recombinant(Shingrix) 05/30/2021, 07/25/2021    TDAP status: Up to date  Flu Vaccine status: Up to date  Pneumococcal vaccine status: Up to date  Covid-19 vaccine status: Information provided on how to  obtain vaccines.   Qualifies for Shingles Vaccine? Yes   Zostavax  completed Yes   Shingrix Completed?: Yes  Screening Tests Health Maintenance  Topic Date Due   DEXA SCAN  03/15/2022   COVID-19 Vaccine (6 - 2023-24 season) 08/26/2023   MAMMOGRAM  04/19/2024   Fecal DNA (Cologuard)  04/21/2024   Medicare Annual Wellness (AWV)  07/19/2024   DTaP/Tdap/Td (3 - Td or Tdap) 12/02/2030   Pneumonia Vaccine 39+ Years old  Completed   INFLUENZA VACCINE  Completed   Hepatitis C Screening  Completed   Zoster Vaccines- Shingrix  Completed   HPV VACCINES  Aged Out    Health Maintenance  Health Maintenance Due  Topic Date Due   DEXA SCAN  03/15/2022    Colorectal cancer screening: Type of screening: Cologuard. Completed 04/21/21. Repeat every 3 years  Mammogram status: Completed 04/20/23. Repeat every year  Bone scan scheduled for 10/18/23  Additional Screening:  Hepatitis C Screening:  Completed 06/17/18  Vision Screening: Recommended annual ophthalmology exams for early detection of glaucoma and other disorders of the eye. Is the patient up to date with their annual eye exam?  No  Who is the provider or what is the name of the office in which the patient attends annual eye exams? Encourage to follow up with provider for eye exams  If pt is not established with a provider, would they like to be referred to a provider to establish care? No .   Dental Screening: Recommended annual dental exams for proper oral hygiene  Community Resource Referral / Chronic Care Management: CRR required this visit?  No   CCM required this visit?  No     Plan:     I have personally reviewed and noted the following in the patient's chart:   Medical and social history Use of alcohol, tobacco or illicit drugs  Current medications and supplements including opioid prescriptions. Patient is not currently taking opioid prescriptions. Functional ability and status Nutritional status Physical activity Advanced directives List of other physicians Hospitalizations,  surgeries, and ER visits in previous 12 months Vitals Screenings to include cognitive, depression, and falls Referrals and appointments  In addition, I have reviewed and discussed with patient certain preventive protocols, quality metrics, and best practice recommendations. A written personalized care plan for preventive services as well as general preventive health recommendations were provided to patient.     [name], LPN   [id number]   After Visit Summary: (MyChart) Due to this being a telephonic visit, the after visit summary with patients personalized plan was offered to patient via MyChart   Nurse Notes: none

## 2023-08-23 ENCOUNTER — Encounter: Payer: Self-pay | Admitting: Family Medicine

## 2023-08-23 ENCOUNTER — Ambulatory Visit (INDEPENDENT_AMBULATORY_CARE_PROVIDER_SITE_OTHER): Payer: Medicare Other | Admitting: Family Medicine

## 2023-08-23 VITALS — BP 138/82 | HR 95 | Temp 98.1°F | Ht 70.0 in | Wt 172.0 lb

## 2023-08-23 DIAGNOSIS — I480 Paroxysmal atrial fibrillation: Secondary | ICD-10-CM | POA: Diagnosis not present

## 2023-08-23 DIAGNOSIS — Z7901 Long term (current) use of anticoagulants: Secondary | ICD-10-CM

## 2023-08-23 DIAGNOSIS — G459 Transient cerebral ischemic attack, unspecified: Secondary | ICD-10-CM

## 2023-08-23 DIAGNOSIS — H539 Unspecified visual disturbance: Secondary | ICD-10-CM

## 2023-08-23 DIAGNOSIS — G43109 Migraine with aura, not intractable, without status migrainosus: Secondary | ICD-10-CM | POA: Diagnosis not present

## 2023-08-23 DIAGNOSIS — E039 Hypothyroidism, unspecified: Secondary | ICD-10-CM | POA: Diagnosis not present

## 2023-08-23 DIAGNOSIS — R4589 Other symptoms and signs involving emotional state: Secondary | ICD-10-CM

## 2023-08-23 NOTE — Patient Instructions (Signed)
Please return in July for complete physical  If you have any questions or concerns, please don't hesitate to send me a message via MyChart or call the office at [phone]. Thank you for visiting with [place] today! It's our pleasure caring for you.

## 2023-08-23 NOTE — Progress Notes (Signed)
Subjective  CC:  Chief Complaint  Patient presents with   Hypothyroidism    HPI: Tina Dalton is a 72 y.o. female who presents to the office today to address the problems listed above in the chief complaint.  Hypothyroidism f/u: Tina Dalton is a 72 y.o. female who presents for follow up of hypothyroidism. Last TSH showed control was undertreated.  We have increased her thyroid hormone to 100 mcg daily.  She feels well.  Here for recheck. History of PAF on Cardizem, beta-blocker and Eliquis twice daily.  She is status post cardioversion.  She reviews her history of some visual changes that occurred in the last several months.  She was evaluated in the emergency room.  I reviewed all emergency room notes, imaging studies and follow-up neurology consult.  She did have a brain MRI, MRA head and neck that were negative for any acute brain changes or any large vessel disease.  A consultation with neurology show possible TIA in the setting of recent new onset A-fib versus migrainous visual changes.  Her symptoms were very brief.  No further workup was needed at that time.  However since, the last week she had 1 other episode where she had slipped vision.  Seeing 1 thing out of 1 eye and the other showing a different view.  She denies double vision.  She denies headache.  She had some brief nausea at the time.  Her symptoms lasted less than 5 minutes.  She had no other neurologic interval time.  She has felt well since. Anxiety: She reports she has been very anxious, not wanting to stay along with her granddaughter and fear that something could happen with her stroke.  She denies palpitations or complications from her blood thinners  Assessment  1. Acquired hypothyroidism   2. TIA (transient ischemic attack)   3. Visual changes   4. Hypocalcemia   5. Anxiety about dying   6. Migraine with aura and without status migrainosus, not intractable   7. Current use of long term anticoagulation   8. PAF  (paroxysmal atrial fibrillation) (HCC)      Plan  Hypothyroidism:  recheck labs today and adjust meds accordingly. Pt is clinicall euthyroid on 100 mcg daily of levothyroxine. Visual changes: Most consistent with migrainous visual changes.  Reassured.  She is maintained on Eliquis 5 mg twice daily.  She is in normal sinus rhythm.  I recommend eye exam and if symptoms recur, or follow-up with neurology just to be reevaluated and get further information of possible migrainous visual changes. Reviewed lab work, all had hypocalcemia that needs to be rechecked.  Recheck today Remains in sinus rhythm after cardioversion.  Continue Eliquis.  Reassured.  Her risk of stroke or sudden death is low.  No restrictions recommended  Follow up: July for complete physical, sooner if needed Visit date not found  Orders Placed This Encounter  Procedures   TSH   Basic metabolic panel   Parathyroid hormone, intact (no Ca)   No orders of the defined types were placed in this encounter.     I reviewed the patients updated PMH, FH, and SocHx.    Patient Active Problem List   Diagnosis Date Noted   TIA (transient ischemic attack) 05/25/2023    Priority: High   Atrial fibrillation with RVR (HCC) 04/14/2023    Priority: High   Palpitations 04/02/2023    Priority: High   Coronary artery calcification 04/02/2023    Priority: High   Mixed hyperlipidemia  04/01/2021    Priority: High   Acquired hypothyroidism 11/17/2017    Priority: High   Prehypertension 11/17/2017    Priority: High   Migraine with aura and without status migrainosus, not intractable 05/25/2023    Priority: Medium    Grade I diastolic dysfunction 04/14/2023    Priority: Medium    Osteopenia after menopause 04/01/2020    Priority: Medium    Heart murmur, systolic 01/02/2020    Priority: Low   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   diltiazem (CARDIZEM CD) 120 MG 24 hr capsule Take 1 capsule (120 mg total) by mouth daily.   ezetimibe (ZETIA) 10 MG tablet Take 1 tablet (10 mg total) by mouth daily.   levothyroxine (SYNTHROID) 100 MCG tablet Take 100 mcg by mouth daily before breakfast.   metoprolol succinate (TOPROL-XL) 25 MG 24 hr tablet TAKE 1 TABLET(25 MG) BY MOUTH AT BEDTIME   rosuvastatin (CRESTOR) 40 MG tablet Take 1 tablet (40 mg total) by mouth daily.    Allergies: Patient has No Known Allergies. Family History: Patient family history includes Alzheimer's disease in her mother; Cancer in her father; Coronary artery disease in her father; Heart attack in her father; Hypertension in her father. Social History:  Patient  reports that she has never smoked. She has never used smokeless tobacco. She reports current alcohol use. She reports that she does not use drugs.  Review of Systems: Constitutional: Negative for fever malaise or anorexia Cardiovascular: negative for chest pain Respiratory: negative for SOB or persistent cough Gastrointestinal: negative for abdominal pain  Objective  Vitals: BP 138/82   Pulse 95   Temp 98.1 F (36.7 C)   Ht 5\' 10"  (1.778 m)   Wt 172 lb (78 kg)   LMP  (LMP Unknown)   SpO2 95%   BMI 24.68 kg/m  General: no acute distress , A&Ox3, appears well HEENT: PEERL, no proptosis or lid lag, conjunctiva normal, Oropharynx moist,neck is supple without goiter or thyromegaly or thyroid nodules extraocular movements intact Cardiovascular:  RRR without murmur or gallop. No peripheral edema Respiratory:  Good breath sounds bilaterally, CTAB with normal respiratory effort Skin:  Warm, no rashes Neuro: no tremor    Commons side effects, risks, benefits, and alternatives for  medications and treatment plan prescribed today were discussed, and the patient expressed understanding of the given instructions. Patient is instructed to call or message via MyChart if he/she has any questions or concerns regarding our treatment plan. No barriers to understanding were identified. We discussed Red Flag symptoms and signs in detail. Patient expressed understanding regarding what to do in case of urgent or emergency type symptoms.  Medication list was reconciled, printed and provided to the patient in AVS. Patient instructions and summary information was reviewed with the patient as documented in the AVS. This note was prepared with assistance of Dragon voice recognition software. Occasional wrong-word or sound-a-like substitutions may have occurred due to the inherent limitations of voice recognition software

## 2023-08-24 LAB — BASIC METABOLIC PANEL
BUN: 17 mg/dL (ref 6–23)
CO2: 27 meq/L (ref 19–32)
Calcium: 9.8 mg/dL (ref 8.4–10.5)
Chloride: 107 meq/L (ref 96–112)
Creatinine, Ser: 0.92 mg/dL (ref 0.40–1.20)
GFR: 63.21 mL/min (ref >60.00)
Glucose, Bld: 118 mg/dL — ABNORMAL HIGH (ref 70–99)
Potassium: 4.2 meq/L (ref 3.5–5.1)
Sodium: 141 meq/L (ref 135–145)

## 2023-08-24 LAB — TSH: TSH: 2.4 u[IU]/mL (ref 0.35–5.50)

## 2023-08-24 LAB — PARATHYROID HORMONE, INTACT (NO CA): PTH: 37 pg/mL (ref 16–77)

## 2023-08-26 NOTE — Progress Notes (Signed)
See mychart note levothyroxine 100 mcg daily Dear Ms. Mcgurk, Your lab results all look good!  Your thyroid levels are now in range.  Please continue your current levothyroxine dose.  Your calcium and potassium test have also normalized. Happy holidays Sincerely, Dr. Mardelle Matte

## 2023-10-18 ENCOUNTER — Other Ambulatory Visit: Payer: Medicare Other

## 2023-10-28 NOTE — Progress Notes (Deleted)
 Cardiology Office Note:    Date:  10/28/2023   ID:  Tina Dalton, DOB 09-23-1950, MRN [id number]  PCP:  Willow Ora, MD   [facility] Providers Cardiologist:  Olga Millers, MD Cardiology APP:  Marcelino Duster, PA { Click to update primary MD,subspecialty MD or APP then REFRESH:1}    Referring MD: Willow Ora, MD   No chief complaint on file. ***  History of Present Illness:    Tina Dalton is a 73 y.o. 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  Tina Dalton established cardiology care with Dr. Jens Som for palpitations.  Heart monitor 06/2022 showed sinus rhythm with short runs of SVT, PVCs, ventricular triplet, and 8 beats of AIVR.  She had a coronary calcium score 06/2022 that was 34.9 placing her at the 61st percentile.  A follow-up echocardiogram 06/2022 showed a normal LVEF, grade 1 DD, mild AI.  She has been stable on 25 mg Toprol.  Hyperlipidemia has been managed with 40 mg Crestor and 10 mg Zetia   She was hospitalized 04/14/2023 found to have new onset atrial fibrillation with RVR in the setting of TSH 0.244.  She had syncopized earlier that day in the shower.  She was caring for her sister and has not been taking care of herself.  She eventually underwent TEE guided cardioversion with successful restoration of sinus rhythm.  She was discharged on 120 mg Cardizem, milligrams Toprol, 5 mg Eliquis twice daily.  I saw her in follow-up 05/05/2023 and was feeling well.  Unfortunately she presented to the ER 05/11/2023 with near syncope.  Brain imaging completed and negative for acute findings.  She was monitored on telemetry for over 9 hours without arrhythmia.  She was discharged without admission.  She presents today for follow-up.    Paroxysmal atrial fibrillation TEE-DCCV on 04/15/2023 Low-dose  Cardizem and Toprol EKG today with    Chronic anticoagulation No bleeding issues on 5 mg Eliquis twice daily   Hyperlipidemia On Crestor and Zetia   Hypothyroidism Synthroid dose was further reduced during her hospitalization Per primary/Endo       Past Medical History:  Diagnosis Date   Atrial fibrillation with RVR (HCC) 04/14/2023   Grade I diastolic dysfunction 04/14/2023   Heart murmur, systolic 01/02/2020   History of bilateral knee replacement 01/02/2020   Hypothyroidism    Mixed hyperlipidemia 04/01/2021   ASCVD 8.3% 03/2021; offered statin   Osteoarthritis of knee    Osteopenia after menopause 04/01/2020   DEXA July 2020: Lowest T score equals -1.9    Past Surgical History:  Procedure Laterality Date   CARDIOVERSION N/A 04/15/2023   Procedure: CARDIOVERSION;  Surgeon: Meriam Sprague, MD;  Location: [facility] INVASIVE CV LAB;  Service: Cardiovascular;  Laterality: N/A;   CATARACT EXTRACTION W/ INTRAOCULAR LENS IMPLANT  2022   S/P Knee replacement   03/2015   TEE WITHOUT CARDIOVERSION N/A 04/15/2023   Procedure: TRANSESOPHAGEAL ECHOCARDIOGRAM;  Surgeon: Meriam Sprague, MD;  Location: [facility] INVASIVE CV LAB;  Service: Cardiovascular;  Laterality: N/A;    Current Medications: No outpatient medications have been marked as taking for the 11/11/23 encounter (Appointment) with Marcelino Duster, PA.     Allergies:   Patient has no known allergies.   Social History   Socioeconomic History   Marital status: Divorced    Spouse name: Not on file  Number of children: 1   Years of education: Not on file   Highest education level: Bachelor's degree (e.g., BA, AB, BS)  Occupational History   Not on file  Tobacco Use   Smoking status: Never   Smokeless tobacco: Never  Vaping Use   Vaping status: Never Used  Substance and Sexual Activity   Alcohol use: Yes    Comment: Rare   Drug use: Never   Sexual activity: Yes  Other Topics Concern   Not on file   Social History Narrative   1 daughter.1 grand dau   Retired Systems analyst   Social Drivers of Corporate investment banker Strain: Low Risk  (07/20/2023)   Overall Financial Resource Strain (CARDIA)    Difficulty of Paying Living Expenses: Not hard at all  Food Insecurity: No Food Insecurity (07/20/2023)   Hunger Vital Sign    Worried About Running Out of Food in the Last Year: Never true    Ran Out of Food in the Last Year: Never true  Transportation Needs: No Transportation Needs (07/20/2023)   PRAPARE - Administrator, Civil Service (Medical): No    Lack of Transportation (Non-Medical): No  Physical Activity: Inactive (07/20/2023)   Exercise Vital Sign    Days of Exercise per Week: 0 days    Minutes of Exercise per Session: 0 min  Stress: No Stress Concern Present (07/20/2023)   [employer] of Occupational Health - Occupational Stress Questionnaire    Feeling of Stress : Not at all  Social Connections: Socially Isolated (07/20/2023)   Social Connection and Isolation Panel [NHANES]    Frequency of Communication with Friends and Family: More than three times a week    Frequency of Social Gatherings with Friends and Family: Once a week    Attends Religious Services: Never    Database administrator or Organizations: No    Attends Engineer, structural: Never    Marital Status: Divorced     Family History: The patient's ***family history includes Alzheimer's disease in her mother; Cancer in her father; Coronary artery disease in her father; Heart attack in her father; Hypertension in her father.  ROS:   Please see the history of present illness.    *** All other systems reviewed and are negative.  EKGs/Labs/Other Studies Reviewed:    The following studies were reviewed today: ***      Recent Labs: 04/14/2023: Magnesium 2.2 04/15/2023: ALT 24 05/15/2023: Hemoglobin 13.8; Platelets 194 08/23/2023: BUN 17; Creatinine, Ser 0.92; Potassium 4.2;  Sodium 141; TSH 2.40  Recent Lipid Panel    Component Value Date/Time   CHOL 139 04/02/2023 0916   CHOL 167 11/26/2022 0823   TRIG 73.0 04/02/2023 0916   HDL 58.40 04/02/2023 0916   HDL 70 11/26/2022 0823   CHOLHDL 2 04/02/2023 0916   VLDL 14.6 04/02/2023 0916   LDLCALC 66 04/02/2023 0916   LDLCALC 77 11/26/2022 0823   LDLDIRECT 139.0 06/17/2018 1212     Risk Assessment/Calculations:   {Does this patient have ATRIAL FIBRILLATION?:[id number]}  No BP recorded.  {Refresh Note OR Click here to enter BP  :1}***         Physical Exam:    VS:  LMP  (LMP Unknown)     Wt Readings from Last 3 Encounters:  08/23/23 172 lb (78 kg)  07/20/23 169 lb (76.7 kg)  07/12/23 169 lb (76.7 kg)     GEN: *** Well nourished, well developed in no acute  distress HEENT: Normal NECK: No JVD; No carotid bruits LYMPHATICS: No lymphadenopathy CARDIAC: ***RRR, no murmurs, rubs, gallops RESPIRATORY:  Clear to auscultation without rales, wheezing or rhonchi  ABDOMEN: Soft, non-tender, non-distended MUSCULOSKELETAL:  No edema; No deformity  SKIN: Warm and dry NEUROLOGIC:  Alert and oriented x 3 PSYCHIATRIC:  Normal affect   ASSESSMENT:    No diagnosis found. PLAN:    In order of problems listed above:  ***      {Are you ordering a CV Procedure (e.g. stress test, cath, DCCV, TEE, etc)?   Press F2        :[id number]}    Medication Adjustments/Labs and Tests Ordered: Current medicines are reviewed at length with the patient today.  Concerns regarding medicines are outlined above.  No orders of the defined types were placed in this encounter.  No orders of the defined types were placed in this encounter.   There are no Patient Instructions on file for this visit.   Signed, Roe Rutherford Matricia Begnaud, PA  10/28/2023 1:11 PM    [facility]

## 2023-11-11 ENCOUNTER — Ambulatory Visit: Payer: Medicare Other | Admitting: Physician Assistant

## 2023-11-19 NOTE — Progress Notes (Deleted)
 Cardiology Office Note:    Date:  11/19/2023   ID:  Tina Dalton, DOB 1951/07/08, MRN [id number]  PCP:  Tina Ora, MD    [facility] Providers Cardiologist:  Tina Millers, MD Cardiology APP:  Tina Duster, PA { Click to update primary MD,subspecialty MD or APP then REFRESH:1}    Referring MD: Tina Ora, MD   No chief complaint on file. ***  History of Present Illness:    Tina Dalton is a 73 y.o. 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  Tina Dalton established cardiology care with Dr. Jens Som for palpitations.  Heart monitor 06/2022 showed sinus rhythm with short runs of SVT, PVCs, ventricular triplet, and 8 beats of AIVR.  She had a coronary calcium score 06/2022 that was 34.9 placing her at the 61st percentile.  A follow-up echocardiogram 06/2022 showed a normal LVEF, grade 1 DD, mild AI.  She has been stable on 25 mg Toprol.  Hyperlipidemia has been managed with 40 mg Crestor and 10 mg Zetia   She was hospitalized 04/14/2023 found to have new onset atrial fibrillation with RVR in the setting of TSH 0.244.  She had syncopized earlier that day in the shower.  She was caring for her sister and has not been taking care of herself.  She eventually underwent TEE guided cardioversion with successful restoration of sinus rhythm.  She was discharged on 120 mg Cardizem, milligrams Toprol, 5 mg Eliquis twice daily.  I saw her in follow-up 05/05/2023 and was feeling well.  Unfortunately she presented to the ER 05/11/2023 with near syncope.  Brain imaging completed and negative for acute findings.  She was monitored on telemetry for over 9 hours without arrhythmia.  She was discharged without admission.  I saw her 04/2023 and she was doing well, in SR. She was seen in the ER 10 days later with dizziness and  vision changes.  MRI brain and MRA without significant abnormality.  She was discharged without admission.  She presents for routine cardiology follow-up.    Paroxysmal atrial fibrillation TEE-DCCV on 04/15/2023 Low-dose Cardizem and Toprol EKG today with    Chronic anticoagulation No bleeding issues on 5 mg Eliquis twice daily   Hyperlipidemia On Crestor and Zetia   Hypothyroidism Synthroid dose was further reduced during her hospitalization Per primary/Endo        Past Medical History:  Diagnosis Date   Atrial fibrillation with RVR (HCC) 04/14/2023   Grade I diastolic dysfunction 04/14/2023   Heart murmur, systolic 01/02/2020   History of bilateral knee replacement 01/02/2020   Hypothyroidism    Mixed hyperlipidemia 04/01/2021   ASCVD 8.3% 03/2021; offered statin   Osteoarthritis of knee    Osteopenia after menopause 04/01/2020   DEXA July 2020: Lowest T score equals -1.9    Past Surgical History:  Procedure Laterality Date   CARDIOVERSION N/A 04/15/2023   Procedure: CARDIOVERSION;  Surgeon: Meriam Sprague, MD;  Location: [facility] INVASIVE CV LAB;  Service: Cardiovascular;  Laterality: N/A;   CATARACT EXTRACTION W/ INTRAOCULAR LENS IMPLANT  2022   S/P Knee replacement   03/2015   TEE WITHOUT CARDIOVERSION N/A 04/15/2023   Procedure: TRANSESOPHAGEAL ECHOCARDIOGRAM;  Surgeon: Meriam Sprague, MD;  Location: [facility] INVASIVE CV LAB;  Service: Cardiovascular;  Laterality: N/A;    Current Medications: No outpatient medications have been marked as taking for the 11/30/23  encounter (Appointment) with Tina Duster, PA.     Allergies:   Patient has no known allergies.   Social History   Socioeconomic History   Marital status: Divorced    Spouse name: Not on file   Number of children: 1   Years of education: Not on file   Highest education level: Bachelor's degree (e.g., BA, AB, BS)  Occupational History   Not on file  Tobacco Use   Smoking status:  Never   Smokeless tobacco: Never  Vaping Use   Vaping status: Never Used  Substance and Sexual Activity   Alcohol use: Yes    Comment: Rare   Drug use: Never   Sexual activity: Yes  Other Topics Concern   Not on file  Social History Narrative   1 daughter.1 grand dau   Retired Systems analyst   Social Drivers of Corporate investment banker Strain: Low Risk  (07/20/2023)   Overall Financial Resource Strain (CARDIA)    Difficulty of Paying Living Expenses: Not hard at all  Food Insecurity: No Food Insecurity (07/20/2023)   Hunger Vital Sign    Worried About Running Out of Food in the Last Year: Never true    Ran Out of Food in the Last Year: Never true  Transportation Needs: No Transportation Needs (07/20/2023)   PRAPARE - Administrator, Civil Service (Medical): No    Lack of Transportation (Non-Medical): No  Physical Activity: Inactive (07/20/2023)   Exercise Vital Sign    Days of Exercise per Week: 0 days    Minutes of Exercise per Session: 0 min  Stress: No Stress Concern Present (07/20/2023)   [employer] of Occupational Health - Occupational Stress Questionnaire    Feeling of Stress : Not at all  Social Connections: Socially Isolated (07/20/2023)   Social Connection and Isolation Panel [NHANES]    Frequency of Communication with Friends and Family: More than three times a week    Frequency of Social Gatherings with Friends and Family: Once a week    Attends Religious Services: Never    Database administrator or Organizations: No    Attends Engineer, structural: Never    Marital Status: Divorced     Family History: The patient's ***family history includes Alzheimer's disease in her mother; Cancer in her father; Coronary artery disease in her father; Heart attack in her father; Hypertension in her father.  ROS:   Please see the history of present illness.    *** All other systems reviewed and are negative.  EKGs/Labs/Other Studies  Reviewed:    The following studies were reviewed today: ***      Recent Labs: 04/14/2023: Magnesium 2.2 04/15/2023: ALT 24 05/15/2023: Hemoglobin 13.8; Platelets 194 08/23/2023: BUN 17; Creatinine, Ser 0.92; Potassium 4.2; Sodium 141; TSH 2.40  Recent Lipid Panel    Component Value Date/Time   CHOL 139 04/02/2023 0916   CHOL 167 11/26/2022 0823   TRIG 73.0 04/02/2023 0916   HDL 58.40 04/02/2023 0916   HDL 70 11/26/2022 0823   CHOLHDL 2 04/02/2023 0916   VLDL 14.6 04/02/2023 0916   LDLCALC 66 04/02/2023 0916   LDLCALC 77 11/26/2022 0823   LDLDIRECT 139.0 06/17/2018 1212     Risk Assessment/Calculations:   {Does this patient have ATRIAL FIBRILLATION?:[id number]}  No BP recorded.  {Refresh Note OR Click here to enter BP  :1}***         Physical Exam:    VS:  LMP  (LMP  Unknown)     Wt Readings from Last 3 Encounters:  08/23/23 172 lb (78 kg)  07/20/23 169 lb (76.7 kg)  07/12/23 169 lb (76.7 kg)     GEN: *** Well nourished, well developed in no acute distress HEENT: Normal NECK: No JVD; No carotid bruits LYMPHATICS: No lymphadenopathy CARDIAC: ***RRR, no murmurs, rubs, gallops RESPIRATORY:  Clear to auscultation without rales, wheezing or rhonchi  ABDOMEN: Soft, non-tender, non-distended MUSCULOSKELETAL:  No edema; No deformity  SKIN: Warm and dry NEUROLOGIC:  Alert and oriented x 3 PSYCHIATRIC:  Normal affect   ASSESSMENT:    No diagnosis found. PLAN:    In order of problems listed above:  ***      {Are you ordering a CV Procedure (e.g. stress test, cath, DCCV, TEE, etc)?   Press F2        :[id number]}    Medication Adjustments/Labs and Tests Ordered: Current medicines are reviewed at length with the patient today.  Concerns regarding medicines are outlined above.  No orders of the defined types were placed in this encounter.  No orders of the defined types were placed in this encounter.   There are no Patient Instructions on file for this visit.    Signed, Tina Dalton, [place]  11/19/2023 11:51 AM    [facility]

## 2023-11-30 ENCOUNTER — Telehealth: Payer: Self-pay

## 2023-11-30 ENCOUNTER — Ambulatory Visit: Payer: Medicare Other | Admitting: Physician Assistant

## 2023-11-30 NOTE — Telephone Encounter (Signed)
 Called patient. Left message to reschedule appointment with Micah Flesher PA.

## 2024-01-09 NOTE — Progress Notes (Signed)
 Cardiology Office Note:    Date:  01/21/2024   ID:  Tina Dalton, DOB June 23, 1951, MRN [id number]  PCP:  Tina Saha, MD   [facility] Providers Cardiologist:  Tina Angel, MD Cardiology APP:  Tina Dalton, [place]     Referring MD: Tina Saha, MD   Chief Complaint  Patient presents with   Follow-up    PAF    History of Present Illness:    Tina Dalton is a 73 y.o. 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  [name] established cardiology care with Dr. Audery Dalton for palpitations.  Heart monitor 06/2022 showed sinus rhythm with short runs of SVT, PVCs, ventricular triplet, and 8 beats of AIVR.  She had a coronary calcium  score 06/2022 that was 34.9 placing her at the 61st percentile.  A follow-up echocardiogram 06/2022 showed a normal LVEF, grade 1 DD, mild AI.  She has been stable on 25 mg Toprol .  Hyperlipidemia has been managed with 40 mg Crestor  and 10 mg Zetia    She was hospitalized 04/14/2023 found to have new onset atrial fibrillation with RVR in the setting of TSH 0.244.  She had syncopized earlier that day in the shower.  She was caring for her sister and has not been taking care of herself.  She eventually underwent TEE guided cardioversion with successful restoration of sinus rhythm.  She was discharged on 120 mg Cardizem , 25 mg Toprol , 5 mg Eliquis  twice daily.  I saw her in follow-up 05/05/2023 and was feeling well.  Unfortunately she presented to the ER 05/11/2023 with near syncope.  Brain imaging completed and negative for acute findings.  She was monitored on telemetry for over 9 hours without arrhythmia.  She was discharged without admission.  I saw her 04/2023 and she was doing well, in SR. She was seen in the ER 10 days later with dizziness and vision changes.  MRI brain and MRA without  significant abnormality.  She was discharged without admission.  She presents for routine cardiology follow-up. She has developed migraine with aura, occurs once every 6-8 weeks, no cardiac complaints. She is not walking/exercising. She is concerned about cardiovascular health because her father died suddenly from MI at age 33. We review all available data, including coronary calcium  score and symptoms.    Past Medical History:  Diagnosis Date   Atrial fibrillation with RVR (HCC) 04/14/2023   Grade I diastolic dysfunction 04/14/2023   Heart murmur, systolic 01/02/2020   History of bilateral knee replacement 01/02/2020   Hypothyroidism    Mixed hyperlipidemia 04/01/2021   ASCVD 8.3% 03/2021; offered statin   Osteoarthritis of knee    Osteopenia after menopause 04/01/2020   DEXA July 2020: Lowest T score equals -1.9    Past Surgical History:  Procedure Laterality Date   CARDIOVERSION N/A 04/15/2023   Procedure: CARDIOVERSION;  Surgeon: Tina Dust, MD;  Location[facility] INVASIVE CV LAB;  Service: Cardiovascular;  Laterality: N/A;   CATARACT EXTRACTION W/ INTRAOCULAR LENS IMPLANT  2022   S/P Knee replacement   03/2015   TEE WITHOUT CARDIOVERSION N/A 04/15/2023   Procedure: TRANSESOPHAGEAL ECHOCARDIOGRAM;  Surgeon: Tina Dust, MD;  Location: [facility] INVASIVE CV LAB;  Service: Cardiovascular;  Laterality: N/A;    Current Medications: Current Meds  Medication Sig   apixaban  (ELIQUIS ) 5 MG TABS tablet Take 1 tablet (5 mg total) by  mouth 2 (two) times daily.   diltiazem  (CARDIZEM  CD) 120 MG 24 hr capsule Take 1 capsule (120 mg total) by mouth daily.   ezetimibe  (ZETIA ) 10 MG tablet Take 1 tablet (10 mg total) by mouth daily.   levothyroxine  (SYNTHROID ) 100 MCG tablet Take 100 mcg by mouth daily before breakfast.   metoprolol  succinate (TOPROL -XL) 25 MG 24 hr tablet TAKE 1 TABLET(25 MG) BY MOUTH AT BEDTIME   rosuvastatin  (CRESTOR ) 40 MG tablet Take 1 tablet (40 mg total) by mouth  daily.     Allergies:   Patient has no known allergies.   Social History   Socioeconomic History   Marital status: Divorced    Spouse name: Not on file   Number of children: 1   Years of education: Not on file   Highest education level: Bachelor's degree (e.g., BA, AB, BS)  Occupational History   Not on file  Tobacco Use   Smoking status: Never   Smokeless tobacco: Never  Vaping Use   Vaping status: Never Used  Substance and Sexual Activity   Alcohol use: Yes    Comment: Rare   Drug use: Never   Sexual activity: Yes  Other Topics Concern   Not on file  Social History Narrative   1 daughter.1 grand dau   Retired Systems analyst   Social Drivers of Corporate investment banker Strain: Low Risk  (07/20/2023)   Overall Financial Resource Strain (CARDIA)    Difficulty of Paying Living Expenses: Not hard at all  Food Insecurity: No Food Insecurity (07/20/2023)   Hunger Vital Sign    Worried About Running Out of Food in the Last Year: Never true    Ran Out of Food in the Last Year: Never true  Transportation Needs: No Transportation Needs (07/20/2023)   PRAPARE - Administrator, Civil Service (Medical): No    Lack of Transportation (Non-Medical): No  Physical Activity: Inactive (07/20/2023)   Exercise Vital Sign    Days of Exercise per Week: 0 days    Minutes of Exercise per Session: 0 min  Stress: No Stress Concern Present (07/20/2023)   [employer] of Occupational Health - Occupational Stress Questionnaire    Feeling of Stress : Not at all  Social Connections: Socially Isolated (07/20/2023)   Social Connection and Isolation Panel [NHANES]    Frequency of Communication with Friends and Family: More than three times a week    Frequency of Social Gatherings with Friends and Family: Once a week    Attends Religious Services: Never    Database administrator or Organizations: No    Attends Engineer, structural: Never    Marital Status: Divorced      Family History: The patient's family history includes Alzheimer's disease in her mother; Cancer in her father; Coronary artery disease in her father; Heart attack in her father; Hypertension in her father.  ROS:   Please see the history of present illness.     All other systems reviewed and are negative.  EKGs/Labs/Other Studies Reviewed:    The following studies were reviewed today:  EKG Interpretation Date/Time:  Friday Jan 21 2024 09:25:40 EDT Ventricular Rate:  82 PR Interval:  184 QRS Duration:  82 QT Interval:  356 QTC Calculation: 415 R Axis:   63  Text Interpretation: Normal sinus rhythm Normal ECG When compared with ECG of 15-May-2023 12:18, PREVIOUS ECG IS PRESENT Confirmed by Marcie Sever ([id number]) on 01/21/2024 9:34:50 AM  Recent Labs: 04/14/2023: Magnesium 2.2 04/15/2023: ALT 24 05/15/2023: Hemoglobin 13.8; Platelets 194 08/23/2023: BUN 17; Creatinine, Ser 0.92; Potassium 4.2; Sodium 141; TSH 2.40  Recent Lipid Panel    Component Value Date/Time   CHOL 139 04/02/2023 0916   CHOL 167 11/26/2022 0823   TRIG 73.0 04/02/2023 0916   HDL 58.40 04/02/2023 0916   HDL 70 11/26/2022 0823   CHOLHDL 2 04/02/2023 0916   VLDL 14.6 04/02/2023 0916   LDLCALC 66 04/02/2023 0916   LDLCALC 77 11/26/2022 0823   LDLDIRECT 139.0 06/17/2018 1212     Risk Assessment/Calculations:    CHA2DS2-VASc Score = 3   This indicates a 3.2% annual risk of stroke. The patient's score is based upon: CHF History: 0 HTN History: 0 Diabetes History: 0 Stroke History: 0 Vascular Disease History: 1 Age Score: 1 Gender Score: 1            Physical Exam:    VS:  BP (!) 100/59   Pulse 82   Ht 5\' 10"  (1.778 m)   Wt 163 lb (73.9 kg)   LMP  (LMP Unknown)   SpO2 95%   BMI 23.39 kg/m     Wt Readings from Last 3 Encounters:  01/21/24 163 lb (73.9 kg)  08/23/23 172 lb (78 kg)  07/20/23 169 lb (76.7 kg)     GEN:  Well nourished, well developed in no acute distress HEENT:  Normal NECK: No JVD; No carotid bruits LYMPHATICS: No lymphadenopathy CARDIAC: RRR, no murmurs, rubs, gallops RESPIRATORY:  Clear to auscultation without rales, wheezing or rhonchi  ABDOMEN: Soft, non-tender, non-distended MUSCULOSKELETAL:  No edema; No deformity  SKIN: Warm and dry NEUROLOGIC:  Alert and oriented x 3 PSYCHIATRIC:  Normal affect   ASSESSMENT:    1. Palpitations   2. PAF (paroxysmal atrial fibrillation) (HCC)   3. Chronic anticoagulation   4. Hyperlipidemia with target LDL less than 70   5. Coronary artery calcification   6. Migraine with aura and without status migrainosus, not intractable    PLAN:    In order of problems listed above:  Paroxysmal atrial fibrillation TEE-DCCV on 04/15/2023 Low-dose Cardizem  and Toprol  EKG today with sinus rhythm, no medication changes   Chronic anticoagulation No bleeding issues on 5 mg Eliquis  twice daily - continue this   Hyperlipidemia with LDL goal < 70 On Crestor  and Zetia  Last LDL 03/2023 was 66 Will check with PCP or here at next visit if not already done   Hypothyroidism Synthroid  dose was further reduced during her hospitalization Per primary/Endo   Migraine with aura - following with neurology - happens about every 6-8 weeks, but she is able to sit down or pull the car over when these occur - advised to see neurology again and to keep a migraine diary    Follow up with me in 6 months.           Medication Adjustments/Labs and Tests Ordered: Current medicines are reviewed at length with the patient today.  Concerns regarding medicines are outlined above.  Orders Placed This Encounter  Procedures   EKG 12-Lead   No orders of the defined types were placed in this encounter.   There are no Patient Instructions on file for this visit.   Signed, Warren Haber Dietrich Samuelson, PA  01/21/2024 10:02 AM    [facility]

## 2024-01-10 DIAGNOSIS — G43109 Migraine with aura, not intractable, without status migrainosus: Secondary | ICD-10-CM | POA: Diagnosis not present

## 2024-01-21 ENCOUNTER — Ambulatory Visit: Attending: Physician Assistant | Admitting: Physician Assistant

## 2024-01-21 ENCOUNTER — Encounter: Payer: Self-pay | Admitting: Physician Assistant

## 2024-01-21 VITALS — BP 100/59 | HR 82 | Ht 70.0 in | Wt 163.0 lb

## 2024-01-21 DIAGNOSIS — R002 Palpitations: Secondary | ICD-10-CM

## 2024-01-21 DIAGNOSIS — E7800 Pure hypercholesterolemia, unspecified: Secondary | ICD-10-CM | POA: Diagnosis not present

## 2024-01-21 DIAGNOSIS — I480 Paroxysmal atrial fibrillation: Secondary | ICD-10-CM

## 2024-01-21 DIAGNOSIS — E785 Hyperlipidemia, unspecified: Secondary | ICD-10-CM | POA: Diagnosis not present

## 2024-01-21 DIAGNOSIS — G43109 Migraine with aura, not intractable, without status migrainosus: Secondary | ICD-10-CM | POA: Diagnosis not present

## 2024-01-21 DIAGNOSIS — I2510 Atherosclerotic heart disease of native coronary artery without angina pectoris: Secondary | ICD-10-CM | POA: Diagnosis not present

## 2024-01-21 DIAGNOSIS — Z7901 Long term (current) use of anticoagulants: Secondary | ICD-10-CM | POA: Diagnosis not present

## 2024-01-21 MED ORDER — DILTIAZEM HCL ER COATED BEADS 120 MG PO CP24
120.0000 mg | ORAL_CAPSULE | Freq: Every day | ORAL | 3 refills | Status: AC
Start: 2024-01-21 — End: ?

## 2024-01-21 MED ORDER — EZETIMIBE 10 MG PO TABS
10.0000 mg | ORAL_TABLET | Freq: Every day | ORAL | 3 refills | Status: AC
Start: 2024-01-21 — End: ?

## 2024-01-21 MED ORDER — APIXABAN 5 MG PO TABS
5.0000 mg | ORAL_TABLET | Freq: Two times a day (BID) | ORAL | 3 refills | Status: AC
Start: 2024-01-21 — End: ?

## 2024-01-21 MED ORDER — ROSUVASTATIN CALCIUM 40 MG PO TABS
40.0000 mg | ORAL_TABLET | Freq: Every day | ORAL | 3 refills | Status: AC
Start: 2024-01-21 — End: ?

## 2024-01-21 MED ORDER — METOPROLOL SUCCINATE ER 25 MG PO TB24
25.0000 mg | ORAL_TABLET | Freq: Every day | ORAL | 3 refills | Status: AC
Start: 2024-01-21 — End: ?

## 2024-01-21 NOTE — Patient Instructions (Signed)
 Medication Instructions:  No changes *If you need a refill on your cardiac medications before your next appointment, please call your pharmacy*  Lab Work: No labs  Testing/Procedures: No testing  Follow-Up: At [facility], you and your health needs are our priority.  As part of our continuing mission to provide you with exceptional heart care, our providers are all part of one team.  This team includes your primary Cardiologist (physician) and Advanced Practice Providers or APPs (Physician Assistants and Nurse Practitioners) who all work together to provide you with the care you need, when you need it.  Your next appointment:   6 month(s)  Provider:   Marcie Sever, PA-C   We recommend signing up for the patient portal called "MyChart".  Sign up information is provided on this After Visit Summary.  MyChart is used to connect with patients for Virtual Visits (Telemedicine).  Patients are able to view lab/test results, encounter notes, upcoming appointments, etc.  Non-urgent messages can be sent to your provider as well.   To learn more about what you can do with MyChart, go to [email or web address].

## 2024-03-23 ENCOUNTER — Encounter: Payer: Self-pay | Admitting: Family Medicine

## 2024-03-23 ENCOUNTER — Ambulatory Visit: Payer: Medicare Other | Admitting: Family Medicine

## 2024-03-23 VITALS — BP 100/60 | HR 78 | Temp 97.8°F | Ht 70.0 in | Wt 169.0 lb

## 2024-03-23 DIAGNOSIS — E039 Hypothyroidism, unspecified: Secondary | ICD-10-CM

## 2024-03-23 DIAGNOSIS — I4891 Unspecified atrial fibrillation: Secondary | ICD-10-CM | POA: Diagnosis not present

## 2024-03-23 DIAGNOSIS — M8580 Other specified disorders of bone density and structure, unspecified site: Secondary | ICD-10-CM

## 2024-03-23 DIAGNOSIS — R030 Elevated blood-pressure reading, without diagnosis of hypertension: Secondary | ICD-10-CM | POA: Diagnosis not present

## 2024-03-23 DIAGNOSIS — Z0001 Encounter for general adult medical examination with abnormal findings: Secondary | ICD-10-CM | POA: Diagnosis not present

## 2024-03-23 DIAGNOSIS — E559 Vitamin D deficiency, unspecified: Secondary | ICD-10-CM

## 2024-03-23 DIAGNOSIS — G43109 Migraine with aura, not intractable, without status migrainosus: Secondary | ICD-10-CM

## 2024-03-23 DIAGNOSIS — Z780 Asymptomatic menopausal state: Secondary | ICD-10-CM

## 2024-03-23 DIAGNOSIS — I5189 Other ill-defined heart diseases: Secondary | ICD-10-CM | POA: Diagnosis not present

## 2024-03-23 DIAGNOSIS — Z1211 Encounter for screening for malignant neoplasm of colon: Secondary | ICD-10-CM

## 2024-03-23 DIAGNOSIS — E782 Mixed hyperlipidemia: Secondary | ICD-10-CM | POA: Diagnosis not present

## 2024-03-23 NOTE — Progress Notes (Signed)
 Subjective  Chief Complaint  Patient presents with   Annual Exam    HPI: Tina Dalton is a 73 y.o. female who presents to [facility] [place] today for a Female Wellness Visit. She also has the concerns and/or needs as listed above in the chief complaint. These will be addressed in addition to the Health Maintenance Visit.   Wellness Visit: annual visit with health maintenance review and exam  Health maintenance: Mammogram is current.  Eligible for repeat Cologuard testing for colon cancer screening in August.  Feels very well.  Immunizations are up-to-date.  Eye exam is up-to-date. Chronic disease f/u and/or acute problem visit: (deemed necessary to be done in addition to the wellness visit): Discussed the use of AI scribe software for clinical note transcription with the patient, who gave verbal consent to proceed.  History of Present Illness Tina Dalton is a 73 year old female with migraines who presents for follow-up of her migraine episodes.  Migraines: She experiences aura migraines, which she finds unsettling. She logs these episodes in her calendar, with the first recorded episode on November 22, followed by episodes in December, January 12, January 23, March 9, April 19, and June 19. The March 9 episode occurred at the [facility], where she was exposed to flashing lights, a known trigger. The June 19 episode happened after overheating from shopping, resulting in split vision and nausea lasting five to seven minutes. A subsequent episode occurred a few days later during a family celebration, lasting about two minutes with slight visual distortion and nausea. These episodes are brief and do not result in headaches.  She is not currently on any preventive medication for migraines. She recalls a previous consultation with a neurologist, which was brief, and she has not followed up since. She manages her symptoms behaviorally.  History of A-fib status post  cardioversion: Reviewed recent cardiology notes.  Stable.  No palpitations.  No chest pain.  Continues Eliquis . Blood pressure: Tolerating metoprolol  and diltiazem .  No symptoms of hypotension.  Hypothyroidism: Clinically feels well.  We had to adjust dose earlier in the year but this has been stable.  Ready for recheck.  History of osteopenia, last bone density in 2021.  Due for repeat screening.  No fractures.  Does take vitamin D  and calcium .  Exercises.    Assessment  1. Encounter for well adult exam with abnormal findings   2. Atrial fibrillation with RVR (HCC)   3. Acquired hypothyroidism   4. Mixed hyperlipidemia   5. Prehypertension   6. Grade I diastolic dysfunction   7. Osteopenia after menopause   8. Vitamin D  deficiency   9. Asymptomatic menopausal state   10. Screening for colorectal cancer   11. Migraine with aura and without status migrainosus, not intractable      Plan  Female Wellness Visit: Age appropriate Health Maintenance and Prevention measures were discussed with patient. Included topics are cancer screening recommendations, ways to keep healthy (see AVS) including dietary and exercise recommendations, regular eye and dental care, use of seat belts, and avoidance of moderate alcohol use and tobacco use.  Ordered Cologuard for repeat testing and is now due for colorectal cancer screening.  Mammogram is up-to-date BMI: discussed patient's BMI and encouraged positive lifestyle modifications to help get to or maintain a target BMI. HM needs and immunizations were addressed and ordered. See below for orders. See HM and immunization section for updates. Routine labs and screening tests ordered including cmp, cbc and  lipids where appropriate. Discussed recommendations regarding Vit D and calcium  supplementation (see AVS)  Chronic disease management visit and/or acute problem visit: Migraines: Clinically consistent with migraines with aura without headache.  Recommend  follow-up with neurologist to ensure no further treatment recommendations are indicated.  No new symptoms.  Fortunately her symptoms do not last long A-fib: Stable per cardiology continue Eliquis .  Monitor CBC Hypertension is controlled.  Monitor renal function electrolytes.  Check lipid Check TSH on levothyroxine .  Clinically euthyroid Osteopenia for follow-up DEXA ordered. Recheck vitamin D  levels Follow up: 6 months to recheck blood pressure and chronic problems Orders Placed This Encounter  Procedures   DG Bone Density   Cologuard   Comprehensive metabolic panel with GFR   Lipid panel   TSH   Vitamin D  (25 hydroxy)   CBC with Differential/Platelet   No orders of the defined types were placed in this encounter.     Body mass index is 24.25 kg/m. Wt Readings from Last 3 Encounters:  03/23/24 169 lb (76.7 kg)  01/21/24 163 lb (73.9 kg)  08/23/23 172 lb (78 kg)     Patient Active Problem List   Diagnosis Date Noted   TIA (transient ischemic attack) 05/25/2023    Priority: High    Midbrain, visual changes July 2024; neuro eval: neg brain MRI/MRA; occurred after new onset a-fib. Started eloquis. Dr Onita    Atrial fibrillation with RVR [facility]) 04/14/2023    Priority: High    Hospitalized July 2024 for syncope related to A-fib rapid ventricular response.  TEE with cardioversion performed successfully.  Discharged on Eliquis  and Cardizem .    Palpitations 04/02/2023    Priority: High    Dr. Pietro: Monitor October 2023 showed sinus rhythm with short runs of SVT, PVCs, ventricular triplet and 8 beats of AIVR.  Calcium  score October 2023 34.9 which was 61st percentile.  Echocardiogram October 2023 showed normal LV function, grade 1 diastolic dysfunction, mild aortic insufficiency.  BB for symptom control    Coronary artery calcification 04/02/2023    Priority: High    Coronary calcium  score 61 Oct 2023: 61st percentile, goal LDL < 70    Mixed hyperlipidemia 04/01/2021     Priority: High    ASCVD 8.3% 03/2021; offered statin    Acquired hypothyroidism 11/17/2017    Priority: High   Prehypertension 11/17/2017    Priority: High   Migraine with aura and without status migrainosus, not intractable 05/25/2023    Priority: Medium    Grade I diastolic dysfunction 04/14/2023    Priority: Medium    Osteopenia after menopause 04/01/2020    Priority: Medium     DEXA July 2020: Lowest T score equals -1.9    Heart murmur, systolic 01/02/2020    Priority: Low   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 01/02/2020    Priority: Low   History of bilateral knee replacement 01/02/2020    Priority: Low   Vitamin D  deficiency 05/29/2019    Priority: Low   Health Maintenance  Topic Date Due   DEXA SCAN  03/15/2022   COVID-19 Vaccine (6 - 2024-25 season) 08/26/2023   MAMMOGRAM  04/19/2024   INFLUENZA VACCINE  04/21/2024   Fecal DNA (Cologuard)  04/21/2024   Medicare Annual Wellness (AWV)  07/19/2024   DTaP/Tdap/Td (3 - Td or Tdap) 12/02/2030   Pneumococcal Vaccine: 50+ Years  Completed   Hepatitis C Screening  Completed   Zoster Vaccines- Shingrix   Completed   Hepatitis B Vaccines  Aged Out   HPV VACCINES  Aged Out   Meningococcal B Vaccine  Aged Out   Immunization History  Administered Date(s) Administered   Fluad Quad(high Dose 65+) 05/24/2019, 08/06/2021   Fluad Trivalent(High Dose 65+) 07/01/2023   Influenza, High Dose Seasonal PF 06/17/2018   Influenza,inj,Quad PF,6+ Mos 10/06/2017   Influenza-Unspecified 06/17/2022   PFIZER(Purple Top)SARS-COV-2 Vaccination 10/11/2019, 11/01/2019, 06/22/2020, 12/27/2020   Pfizer Covid-19 Vaccine Bivalent Booster 4yrs & up 07/01/2023   Pneumococcal Conjugate-13 01/02/2020   Pneumococcal Polysaccharide-23 04/23/2021   Respiratory Syncytial Virus Vaccine,Recomb Aduvanted(Arexvy) 06/17/2022   Tdap 11/17/2017, 12/01/2020   Zoster Recombinant(Shingrix ) 05/30/2021, 07/25/2021   We updated and  reviewed the patient's past history in detail and it is documented below. Allergies: Patient has no known allergies. Past Medical History Patient  has a past medical history of Atrial fibrillation with RVR (HCC) (04/14/2023), Grade I diastolic dysfunction (04/14/2023), Heart murmur, systolic (01/02/2020), History of bilateral knee replacement (01/02/2020), Hypothyroidism, Mixed hyperlipidemia (04/01/2021), Osteoarthritis of knee, and Osteopenia after menopause (04/01/2020). Past Surgical History Patient  has a past surgical history that includes S/P Knee replacement  (03/2015); Cataract extraction w/ intraocular lens implant (2022); TEE without cardioversion (N/A, 04/15/2023); and Cardioversion (N/A, 04/15/2023). Family History: Patient family history includes Alzheimer's disease in her mother; Cancer in her father; Coronary artery disease in her father; Heart attack in her father; Hypertension in her father. Social History:  Patient  reports that she has never smoked. She has never used smokeless tobacco. She reports current alcohol use. She reports that she does not use drugs.  Review of Systems: Constitutional: negative for fever or malaise Ophthalmic: negative for photophobia, double vision or loss of vision Cardiovascular: negative for chest pain, dyspnea on exertion, or new LE swelling Respiratory: negative for SOB or persistent cough Gastrointestinal: negative for abdominal pain, change in bowel habits or melena Genitourinary: negative for dysuria or gross hematuria, no abnormal uterine bleeding or disharge Musculoskeletal: negative for new gait disturbance or muscular weakness Integumentary: negative for new or persistent rashes, no breast lumps Neurological: negative for TIA or stroke symptoms Psychiatric: negative for SI or delusions Allergic/Immunologic: negative for hives  Patient Care Team    Relationship Specialty Notifications Start End  [name], MD PCP - General  Family Medicine  01/02/20   Pietro Redell RAMAN, MD PCP - Cardiology Cardiology  06/09/22   Madie Jon Garre, PA Physician Assistant Cardiology  05/05/23   Onita Duos, MD Consulting Physician Neurology  05/26/23     Objective  Vitals: BP 100/60   Pulse 78   Temp 97.8 F (36.6 C) (Oral)   Ht 5' 10 (1.778 m)   Wt 169 lb (76.7 kg)   LMP  (LMP Unknown)   SpO2 97%   BMI 24.25 kg/m  General:  Well developed, well nourished, no acute distress  Psych:  Alert and orientedx3,normal mood and affect HEENT:  Normocephalic, atraumatic, non-icteric sclera,  supple neck without adenopathy, mass or thyromegaly Cardiovascular:  Normal S1, S2, RRR without gallop, rub or murmur Respiratory:  Good breath sounds bilaterally, CTAB with normal respiratory effort Gastrointestinal: normal bowel sounds, soft, non-tender, no noted masses. No HSM MSK: extremities without edema, joints without erythema or swelling Neurologic:    Mental status is normal.  Gross motor and sensory exams are normal.  No tremor  Commons side effects, risks, benefits, and alternatives for medications and treatment plan prescribed today were discussed, and the patient expressed understanding of the given instructions. Patient is instructed to call  or message via MyChart if he/she has any questions or concerns regarding our treatment plan. No barriers to understanding were identified. We discussed Red Flag symptoms and signs in detail. Patient expressed understanding regarding what to do in case of urgent or emergency type symptoms.  Medication list was reconciled, printed and provided to the patient in AVS. Patient instructions and summary information was reviewed with the patient as documented in the AVS. This note was prepared with assistance of Dragon voice recognition software. Occasional wrong-word or sound-a-like substitutions may have occurred due to the inherent limitations of voice recognition software

## 2024-03-23 NOTE — Patient Instructions (Addendum)
 Please return in 6 months for hypertension follow up. Please schedule a fasting lab appointment and get scheduled for a DEXA at our [place] office.   I will release your lab results to you on your MyChart account with further instructions. You may see the results before I do, but when I review them I will send you a message with my report or have my assistant call you if things need to be discussed. Please reply to my message with any questions. Thank you!   I recommend the Cologuard test for your colon cancer screening that is due. I have ordered this test for you. The Cologuard company will soon contact you to verify your insurance, address etc. They will then send you the kit; follow the instructions in the kit and return the kit to [employer]olog[employer]. They will run the test and send the results to me. I will then give you the results. If this test is negative, we recommend repeating a colon cancer screening test in 3 years. If it is positive, I will refer you to a Gastroenterologist so you can get set up for the recommended colonoscopy.  Thank you!   If you have any questions or concerns, please don't hesitate to send me a message via MyChart or call the office at [phone]. Thank you for visiting with us  today! It's our pleasure caring for you.

## 2024-03-27 ENCOUNTER — Other Ambulatory Visit (INDEPENDENT_AMBULATORY_CARE_PROVIDER_SITE_OTHER)

## 2024-03-27 ENCOUNTER — Ambulatory Visit: Payer: Self-pay | Admitting: Family Medicine

## 2024-03-27 DIAGNOSIS — E559 Vitamin D deficiency, unspecified: Secondary | ICD-10-CM | POA: Diagnosis not present

## 2024-03-27 DIAGNOSIS — E782 Mixed hyperlipidemia: Secondary | ICD-10-CM

## 2024-03-27 DIAGNOSIS — I4891 Unspecified atrial fibrillation: Secondary | ICD-10-CM | POA: Diagnosis not present

## 2024-03-27 DIAGNOSIS — E039 Hypothyroidism, unspecified: Secondary | ICD-10-CM

## 2024-03-27 DIAGNOSIS — R030 Elevated blood-pressure reading, without diagnosis of hypertension: Secondary | ICD-10-CM | POA: Diagnosis not present

## 2024-03-27 DIAGNOSIS — Z0001 Encounter for general adult medical examination with abnormal findings: Secondary | ICD-10-CM | POA: Diagnosis not present

## 2024-03-27 LAB — COMPREHENSIVE METABOLIC PANEL WITH GFR
ALT: 44 U/L — ABNORMAL HIGH (ref 0–35)
AST: 35 U/L (ref 0–37)
Albumin: 4.1 g/dL (ref 3.5–5.2)
Alkaline Phosphatase: 55 U/L (ref 39–117)
BUN: 15 mg/dL (ref 6–23)
CO2: 28 meq/L (ref 19–32)
Calcium: 9.2 mg/dL (ref 8.4–10.5)
Chloride: 102 meq/L (ref 96–112)
Creatinine, Ser: 0.86 mg/dL (ref 0.40–1.20)
GFR: 68.26 mL/min (ref >60.00)
Glucose, Bld: 97 mg/dL (ref 70–99)
Potassium: 3.7 meq/L (ref 3.5–5.1)
Sodium: 139 meq/L (ref 135–145)
Total Bilirubin: 0.7 mg/dL (ref 0.2–1.2)
Total Protein: 6.9 g/dL (ref 6.0–8.3)

## 2024-03-27 LAB — LIPID PANEL
Cholesterol: 151 mg/dL (ref 0–200)
HDL: 70.3 mg/dL (ref >39.00)
LDL Cholesterol: 67 mg/dL (ref 0–99)
NonHDL: 80.76
Total CHOL/HDL Ratio: 2
Triglycerides: 68 mg/dL (ref 0.0–149.0)
VLDL: 13.6 mg/dL (ref 0.0–40.0)

## 2024-03-27 LAB — VITAMIN D 25 HYDROXY (VIT D DEFICIENCY, FRACTURES): VITD: 42.01 ng/mL (ref 30.00–100.00)

## 2024-03-27 LAB — TSH: TSH: 7.48 u[IU]/mL — ABNORMAL HIGH (ref 0.35–5.50)

## 2024-03-27 LAB — CBC WITH DIFFERENTIAL/PLATELET
Basophils Absolute: 0.1 K/uL (ref 0.0–0.1)
Basophils Relative: 0.8 % (ref 0.0–3.0)
Eosinophils Absolute: 0.6 K/uL (ref 0.0–0.7)
Eosinophils Relative: 7.8 % — ABNORMAL HIGH (ref 0.0–5.0)
HCT: 41.9 % (ref 36.0–46.0)
Hemoglobin: 14 g/dL (ref 12.0–15.0)
Lymphocytes Relative: 20.6 % (ref 12.0–46.0)
Lymphs Abs: 1.7 K/uL (ref 0.7–4.0)
MCHC: 33.4 g/dL (ref 30.0–36.0)
MCV: 87.4 fl (ref 78.0–100.0)
Monocytes Absolute: 0.6 K/uL (ref 0.1–1.0)
Monocytes Relative: 7.1 % (ref 3.0–12.0)
Neutro Abs: 5.2 K/uL (ref 1.4–7.7)
Neutrophils Relative %: 63.7 % (ref 43.0–77.0)
Platelets: 218 K/uL (ref 150.0–400.0)
RBC: 4.79 Mil/uL (ref 3.87–5.11)
RDW: 13.2 % (ref 11.5–15.5)
WBC: 8.1 K/uL (ref 4.0–10.5)

## 2024-03-27 NOTE — Progress Notes (Signed)
 See mychart note Dear Ms. Shatzer, As you have seen, your TSH is high. Remember, this number is inversely proportionate to the activity of thyroid  hormone, so a high TSH is indicating that you are not getting enough levothyroxine . So, taking your medications that morning is not the explanation.  Please ensure you have the 100mcg dose of levothyroxine , and are taking it as follows: Recommendations for proper thyroid  replacement: - in am - fasting - at least 30 min from breakfast - no proton pump inhibitors like omeprazole, Calcium , multivitamin or iron within 4 hours If you are taking 100mcg daily as above, I will need to increase your dose again.   Your other lab results look good overall. A liver test is just above normal and I will recheck this when we next check your thyroid  levels. Your vit D is now good and your cholesterol levels are good as well.  Please let me know about your levothyroxine  dose and if you have been consistently taking it as above. Thanks Dr. Jodie

## 2024-03-28 ENCOUNTER — Other Ambulatory Visit: Payer: Self-pay

## 2024-03-28 DIAGNOSIS — E039 Hypothyroidism, unspecified: Secondary | ICD-10-CM

## 2024-03-28 MED ORDER — LEVOTHYROXINE SODIUM 100 MCG PO TABS: 100.0000 ug | ORAL_TABLET | Freq: Every day | ORAL | 2 refills | Status: DC

## 2024-06-22 ENCOUNTER — Other Ambulatory Visit: Payer: Self-pay

## 2024-06-22 ENCOUNTER — Other Ambulatory Visit

## 2024-06-22 ENCOUNTER — Telehealth: Payer: Self-pay

## 2024-06-22 ENCOUNTER — Other Ambulatory Visit (INDEPENDENT_AMBULATORY_CARE_PROVIDER_SITE_OTHER)

## 2024-06-22 DIAGNOSIS — E0590 Thyrotoxicosis, unspecified without thyrotoxic crisis or storm: Secondary | ICD-10-CM

## 2024-06-22 LAB — TSH: TSH: 4.98 u[IU]/mL (ref 0.35–5.50)

## 2024-06-22 NOTE — Telephone Encounter (Signed)
 Copied from CRM [id number]. Topic: Appointments - Scheduling Inquiry for Clinic >> Jun 22, 2024 10:07 AM Tina Dalton wrote: Reason for CRM: Patient has scheduled a Lab - Testing for Thyroid  and to get Refill on Rx: levothyroxine  (SYNTHROID ) 100 MCG tablet  She has already taken her Rx(Daily) wants to know if she can still come in, today or should she reschedule for a later time - a morning prior to Rx being administered.    **PCP/PCP Team Please Advise**  Spoke with pt and dr. Jodie and I have placed a future TSH for pt to have drawn. Pt is still currently taking the 100mcg of levothyroxine . Will wait til lab results return before the dosage is changed per Dr. Jodie.

## 2024-06-22 NOTE — Telephone Encounter (Signed)
 Copied from CRM [id number]. Topic: Appointments - Scheduling Inquiry for Clinic >> Jun 22, 2024 10:07 AM Tina Dalton wrote: Reason for CRM: Patient has scheduled a Lab - Testing for Thyroid  and to get Refill on Rx: levothyroxine  (SYNTHROID ) 100 MCG tablet  She has already taken her Rx(Daily) wants to know if she can still come in, today or should she reschedule for a later time - a morning prior to Rx being administered.    **PCP/PCP Team Please Advise**  Labs has been placed in Future for pt. Dosage change will be dependant on lab results and pt has been informed and currently she is still on the 100mcg

## 2024-06-22 NOTE — Telephone Encounter (Signed)
 Copied from CRM [id number]. Topic: Appointments - Scheduling Inquiry for Clinic >> Jun 22, 2024 10:07 AM Tina Dalton wrote: Reason for CRM: Patient has scheduled a Lab - Testing for Thyroid  and to get Refill on Rx: levothyroxine  (SYNTHROID ) 100 MCG tablet  She has already taken her Rx(Daily) wants to know if she can still come in, today or should she reschedule for a later time - a morning prior to Rx being administered.    **PCP/PCP Team Please Advise**  Please Advise

## 2024-06-26 ENCOUNTER — Ambulatory Visit: Payer: Self-pay | Admitting: Family Medicine

## 2024-06-26 DIAGNOSIS — R7989 Other specified abnormal findings of blood chemistry: Secondary | ICD-10-CM

## 2024-06-26 DIAGNOSIS — E039 Hypothyroidism, unspecified: Secondary | ICD-10-CM

## 2024-06-26 MED ORDER — LEVOTHYROXINE SODIUM 112 MCG PO TABS: 112.0000 ug | ORAL_TABLET | Freq: Every day | ORAL | 3 refills | Status: AC

## 2024-06-26 NOTE — Progress Notes (Signed)
 See mychart note Dear Tina Dalton, Your TSH level is improved but still remains a little higher than I'd like. We want your level between 1 and 2 if possible.   So I am sending in a higher dose of your levothyroxine ; please start taking 112mcg daily instead of the 100mcg daily. I'd like to recheck your levels again in 8 weeks. I have placed the orders and sent in the new dose. Please schedule a lab visit in 8 weeks.  Thanks! Sincerely, Dr. Jodie

## 2024-06-27 ENCOUNTER — Telehealth: Payer: Self-pay | Admitting: Physician Assistant

## 2024-06-27 NOTE — Telephone Encounter (Signed)
 RX sent in on 01/21/24. Confirmed with Pharmacy. Called Pt and Pt verbalized understanding.

## 2024-06-27 NOTE — Telephone Encounter (Signed)
 Spoke to patient -- no labs need ,just reviewing current lab file.  Recommend to come to  appointment- fasting  just incae something arise. Take medication and drink water  Patient verbalized understanding.

## 2024-06-27 NOTE — Telephone Encounter (Signed)
*  STAT* If patient is at the pharmacy, call can be transferred to refill team.   1. Which medications need to be refilled? (please list name of each medication and dose if known)   apixaban  (ELIQUIS ) 5 MG TABS tablet    rosuvastatin  (CRESTOR ) 40 MG tablet  metoprolol  succinate (TOPROL -XL) 25 MG 24 hr tablet   diltiazem  (CARDIZEM  CD) 120 MG 24 hr capsule    2. Would you like to learn more about the convenience, safety, & potential cost savings by using the [facility]? No   3. Are you open to using the [facility] (Type [facility]. No   4. Which pharmacy/location (including street and city if local pharmacy) is medication to be sent to? [id number] - [place], [place] - [address] AT [facility] OF [address]       5. Do they need a 30 day or 90 day supply? 90 day    Pt has appt 11/5

## 2024-06-27 NOTE — Telephone Encounter (Signed)
 Pt has 6 mo f/u appt scheduled 07/26/24. She would like to know if labs are needed prior to appt. Please advise.

## 2024-07-20 NOTE — Progress Notes (Signed)
 Cardiology Office Note:    Date:  07/26/2024   ID:  Tina Dalton, DOB February 23, 1951, MRN [id number]  PCP:  [name], MD   [facility] Providers Cardiologist:  Redell Shallow, MD Cardiology APP:  Madie Jon Garre, [place]     Referring MD: [name], MD   Chief Complaint  Patient presents with   Follow-up    PAF    History of Present Illness:    Tina Dalton is a 73 y.o. 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  [name] established cardiology care with Dr. Shallow for palpitations.  Heart monitor 06/2022 showed sinus rhythm with short runs of SVT, PVCs, ventricular triplet, and 8 beats of AIVR.  She had a coronary calcium  score 06/2022 that was 34.9 placing her at the 61st percentile.  A follow-up echocardiogram 06/2022 showed a normal LVEF, grade 1 DD, mild AI.  She has been stable on 25 mg Toprol .  Hyperlipidemia has been managed with 40 mg Crestor  and 10 mg Zetia    She was hospitalized 04/14/2023 found to have new onset atrial fibrillation with RVR in the setting of TSH 0.244.  She had syncopized earlier that day in the shower.  She was caring for her sister and has not been taking care of herself.  She eventually underwent TEE guided cardioversion with successful restoration of sinus rhythm.  She was discharged on 120 mg Cardizem , milligrams Toprol , 5 mg Eliquis  twice daily.  I saw her in follow-up 05/05/2023 and was feeling well.  Unfortunately she presented to the ER 05/11/2023 with near syncope.  Brain imaging completed and negative for acute findings.  She was monitored on telemetry for over 9 hours without arrhythmia.  She was discharged without admission.  I saw her 04/2023 and she was doing well, in SR. She was seen in the ER 10 days later with dizziness and vision changes.  MRI brain and MRA  without significant abnormality.  She was discharged without admission.  She presents for routine cardiology follow-up. She has no cardiac concerns, recent problems with migraines. She is less active, occasionally babysits granddaughter. She can complete about 4.0 METS without chest.    Past Medical History:  Diagnosis Date   Atrial fibrillation with RVR (HCC) 04/14/2023   Grade I diastolic dysfunction 04/14/2023   Heart murmur, systolic 01/02/2020   History of bilateral knee replacement 01/02/2020   Hypothyroidism    Mixed hyperlipidemia 04/01/2021   ASCVD 8.3% 03/2021; offered statin   Osteoarthritis of knee    Osteopenia after menopause 04/01/2020   DEXA July 2020: Lowest T score equals -1.9    Past Surgical History:  Procedure Laterality Date   CARDIOVERSION N/A 04/15/2023   Procedure: CARDIOVERSION;  Surgeon: Hobart Powell BRAVO, MD;  Location: [facility] INVASIVE CV LAB;  Service: Cardiovascular;  Laterality: N/A;   CATARACT EXTRACTION W/ INTRAOCULAR LENS IMPLANT  2022   S/P Knee replacement   03/2015   TEE WITHOUT CARDIOVERSION N/A 04/15/2023   Procedure: TRANSESOPHAGEAL ECHOCARDIOGRAM;  Surgeon: Hobart Powell BRAVO, MD;  Location: [facility] INVASIVE CV LAB;  Service: Cardiovascular;  Laterality: N/A;    Current Medications: Current Meds  Medication Sig   apixaban  (ELIQUIS ) 5 MG TABS tablet Take 1 tablet (5 mg total) by mouth 2 (two) times daily.   Cholecalciferol  (VITAMIN D3) 50 MCG (2000 UT) capsule Take 2,000 Units by mouth daily.  diltiazem  (CARDIZEM  CD) 120 MG 24 hr capsule Take 1 capsule (120 mg total) by mouth daily.   ezetimibe  (ZETIA ) 10 MG tablet Take 1 tablet (10 mg total) by mouth daily.   levothyroxine  (SYNTHROID ) 112 MCG tablet Take 1 tablet (112 mcg total) by mouth daily.   metoprolol  succinate (TOPROL -XL) 25 MG 24 hr tablet Take 1 tablet (25 mg total) by mouth daily.   rosuvastatin  (CRESTOR ) 40 MG tablet Take 1 tablet (40 mg total) by mouth daily.     Allergies:    Patient has no known allergies.   Social History   Socioeconomic History   Marital status: Divorced    Spouse name: Not on file   Number of children: 1   Years of education: Not on file   Highest education level: Bachelor's degree (e.g., BA, AB, BS)  Occupational History   Not on file  Tobacco Use   Smoking status: Never   Smokeless tobacco: Never  Vaping Use   Vaping status: Never Used  Substance and Sexual Activity   Alcohol use: Yes    Comment: Rare   Drug use: Never   Sexual activity: Yes  Other Topics Concern   Not on file  Social History Narrative   1 daughter.1 grand dau   Retired systems analyst   Social Drivers of Corporate Investment Banker Strain: Low Risk  (07/20/2023)   Overall Financial Resource Strain (CARDIA)    Difficulty of Paying Living Expenses: Not hard at all  Food Insecurity: No Food Insecurity (07/20/2023)   Hunger Vital Sign    Worried About Running Out of Food in the Last Year: Never true    Ran Out of Food in the Last Year: Never true  Transportation Needs: No Transportation Needs (07/20/2023)   PRAPARE - Administrator, Civil Service (Medical): No    Lack of Transportation (Non-Medical): No  Physical Activity: Inactive (07/20/2023)   Exercise Vital Sign    Days of Exercise per Week: 0 days    Minutes of Exercise per Session: 0 min  Stress: No Stress Concern Present (07/20/2023)   [employer] of Occupational Health - Occupational Stress Questionnaire    Feeling of Stress : Not at all  Social Connections: Socially Isolated (07/20/2023)   Social Connection and Isolation Panel    Frequency of Communication with Friends and Family: More than three times a week    Frequency of Social Gatherings with Friends and Family: Once a week    Attends Religious Services: Never    Database Administrator or Organizations: No    Attends Engineer, Structural: Never    Marital Status: Divorced     Family History: The  patient's family history includes Alzheimer's disease in her mother; Cancer in her father; Coronary artery disease in her father; Heart attack in her father; Hypertension in her father.  ROS:   Please see the history of present illness.     All other systems reviewed and are negative.  EKGs/Labs/Other Studies Reviewed:    The following studies were reviewed today:       Recent Labs: 03/27/2024: ALT 44; BUN 15; Creatinine, Ser 0.86; Hemoglobin 14.0; Platelets 218.0; Potassium 3.7; Sodium 139 06/22/2024: TSH 4.98  Recent Lipid Panel    Component Value Date/Time   CHOL 151 03/27/2024 0859   CHOL 167 11/26/2022 0823   TRIG 68.0 03/27/2024 0859   HDL 70.30 03/27/2024 0859   HDL 70 11/26/2022 0823   CHOLHDL 2 03/27/2024  0859   VLDL 13.6 03/27/2024 0859   LDLCALC 67 03/27/2024 0859   LDLCALC 77 11/26/2022 0823   LDLDIRECT 139.0 06/17/2018 1212     Risk Assessment/Calculations:    CHA2DS2-VASc Score = 3   This indicates a 3.2% annual risk of stroke. The patient's score is based upon: CHF History: 0 HTN History: 1 Diabetes History: 0 Stroke History: 0 Vascular Disease History: 0 Age Score: 1 Gender Score: 1              Physical Exam:    VS:  BP 118/76 (BP Location: Right Arm, Patient Position: Sitting, Cuff Size: Normal)   Pulse 84   Ht 5' 10 (1.778 m)   Wt 169 lb (76.7 kg)   LMP  (LMP Unknown)   SpO2 97%   BMI 24.25 kg/m     Wt Readings from Last 3 Encounters:  07/26/24 169 lb (76.7 kg)  03/23/24 169 lb (76.7 kg)  01/21/24 163 lb (73.9 kg)     GEN:  Well nourished, well developed in no acute distress HEENT: Normal NECK: No JVD; No carotid bruits LYMPHATICS: No lymphadenopathy CARDIAC: RRR, no murmurs, rubs, gallops RESPIRATORY:  Clear to auscultation without rales, wheezing or rhonchi  ABDOMEN: Soft, non-tender, non-distended MUSCULOSKELETAL:  No edema; No deformity  SKIN: Warm and dry NEUROLOGIC:  Alert and oriented x 3 PSYCHIATRIC:  Normal affect    ASSESSMENT:    1. PAF (paroxysmal atrial fibrillation) (HCC)   2. Chronic anticoagulation   3. Hyperlipidemia with target LDL less than 70   4. Acquired hypothyroidism    PLAN:    In order of problems listed above:  Paroxysmal atrial fibrillation TEE-DCCV on 04/15/2023 Continue 120 mg Cardizem  and 25 mg Toprol  - no palpitations   Chronic anticoagulation No bleeding issues on 5 mg Eliquis  twice daily - continue   Hyperlipidemia with LDL < 70 On 40 mg Crestor  and Zetia  -continue Lipid panel with LDL 67 on 03/2024   Hypothyroidism Synthroid  dose was further reduced during her hospitalization Per primary/Endo   Follow up in 1 year.       Medication Adjustments/Labs and Tests Ordered: Current medicines are reviewed at length with the patient today.  Concerns regarding medicines are outlined above.  No orders of the defined types were placed in this encounter.  No orders of the defined types were placed in this encounter.   Patient Instructions  Medication Instructions:  NO CHANGES  *If you need a refill on your cardiac medications before your next appointment, please call your pharmacy*   Follow-Up: At [facility], you and your health needs are our priority.  As part of our continuing mission to provide you with exceptional heart care, our providers are all part of one team.  This team includes your primary Cardiologist (physician) and Advanced Practice Providers or APPs (Physician Assistants and Nurse Practitioners) who all work together to provide you with the care you need, when you need it.  Your next appointment:    12 months with Dr. Pietro or Mercy Hails PA  We recommend signing up for the patient portal called MyChart.  Sign up information is provided on this After Visit Summary.  MyChart is used to connect with patients for Virtual Visits (Telemedicine).  Patients are able to view lab/test results, encounter notes, upcoming appointments,  etc.  Non-urgent messages can be sent to your provider as well.   To learn more about what you can do with MyChart, go to [email or web address].  Other Instructions            Signed, Jon Nat Hails, [place]  07/26/2024 8:42 AM    [facility]

## 2024-07-26 ENCOUNTER — Ambulatory Visit: Attending: Cardiology | Admitting: Physician Assistant

## 2024-07-26 ENCOUNTER — Encounter: Payer: Self-pay | Admitting: Physician Assistant

## 2024-07-26 VITALS — BP 118/76 | HR 84 | Ht 70.0 in | Wt 169.0 lb

## 2024-07-26 DIAGNOSIS — Z7901 Long term (current) use of anticoagulants: Secondary | ICD-10-CM

## 2024-07-26 DIAGNOSIS — I480 Paroxysmal atrial fibrillation: Secondary | ICD-10-CM | POA: Diagnosis not present

## 2024-07-26 DIAGNOSIS — E039 Hypothyroidism, unspecified: Secondary | ICD-10-CM | POA: Diagnosis not present

## 2024-07-26 DIAGNOSIS — E785 Hyperlipidemia, unspecified: Secondary | ICD-10-CM | POA: Diagnosis not present

## 2024-07-26 NOTE — Patient Instructions (Signed)
 Medication Instructions:  NO CHANGES  *If you need a refill on your cardiac medications before your next appointment, please call your pharmacy*   Follow-Up: At [facility] you and your health needs are our priority.  As part of our continuing mission to provide you with exceptional heart care, our providers are all part of one team.  This team includes your primary Cardiologist (physician) and Advanced Practice Providers or APPs (Physician Assistants and Nurse Practitioners) who all work together to provide you with the care you need, when you need it.  Your next appointment:    12 months with Dr. Pietro or Mercy Hails PA  We recommend signing up for the patient portal called MyChart.  Sign up information is provided on this After Visit Summary.  MyChart is used to connect with patients for Virtual Visits (Telemedicine).  Patients are able to view lab/test results, encounter notes, upcoming appointments, etc.  Non-urgent messages can be sent to your provider as well.   To learn more about what you can do with MyChart, go to [email or web address].   Other Instructions

## 2024-08-25 ENCOUNTER — Other Ambulatory Visit

## 2024-08-28 ENCOUNTER — Ambulatory Visit: Admitting: Family Medicine

## 2024-08-29 ENCOUNTER — Other Ambulatory Visit

## 2024-08-29 DIAGNOSIS — R7989 Other specified abnormal findings of blood chemistry: Secondary | ICD-10-CM

## 2024-08-29 DIAGNOSIS — E039 Hypothyroidism, unspecified: Secondary | ICD-10-CM

## 2024-08-29 LAB — HEPATIC FUNCTION PANEL
ALT: 33 U/L (ref 0–35)
AST: 29 U/L (ref 0–37)
Albumin: 4.1 g/dL (ref 3.5–5.2)
Alkaline Phosphatase: 68 U/L (ref 39–117)
Bilirubin, Direct: 0.1 mg/dL (ref 0.0–0.3)
Total Bilirubin: 0.5 mg/dL (ref 0.2–1.2)
Total Protein: 7 g/dL (ref 6.0–8.3)

## 2024-08-29 LAB — T3, FREE: T3, Free: 2.7 pg/mL (ref 2.3–4.2)

## 2024-08-29 LAB — T4, FREE: Free T4: 1.23 ng/dL (ref 0.60–1.60)

## 2024-08-29 LAB — TSH: TSH: 1.46 u[IU]/mL (ref 0.35–5.50)

## 2024-09-07 ENCOUNTER — Ambulatory Visit (INDEPENDENT_AMBULATORY_CARE_PROVIDER_SITE_OTHER): Admitting: Family Medicine

## 2024-09-07 ENCOUNTER — Encounter: Payer: Self-pay | Admitting: Family Medicine

## 2024-09-07 VITALS — BP 112/72 | HR 77 | Temp 97.0°F | Ht 70.0 in | Wt 167.4 lb

## 2024-09-07 DIAGNOSIS — Z1211 Encounter for screening for malignant neoplasm of colon: Secondary | ICD-10-CM | POA: Diagnosis not present

## 2024-09-07 DIAGNOSIS — Z780 Asymptomatic menopausal state: Secondary | ICD-10-CM | POA: Diagnosis not present

## 2024-09-07 DIAGNOSIS — R030 Elevated blood-pressure reading, without diagnosis of hypertension: Secondary | ICD-10-CM

## 2024-09-07 DIAGNOSIS — I480 Paroxysmal atrial fibrillation: Secondary | ICD-10-CM | POA: Diagnosis not present

## 2024-09-07 DIAGNOSIS — E782 Mixed hyperlipidemia: Secondary | ICD-10-CM | POA: Diagnosis not present

## 2024-09-07 DIAGNOSIS — Z1231 Encounter for screening mammogram for malignant neoplasm of breast: Secondary | ICD-10-CM | POA: Diagnosis not present

## 2024-09-07 DIAGNOSIS — Z1212 Encounter for screening for malignant neoplasm of rectum: Secondary | ICD-10-CM

## 2024-09-07 DIAGNOSIS — E039 Hypothyroidism, unspecified: Secondary | ICD-10-CM | POA: Diagnosis not present

## 2024-09-07 NOTE — Progress Notes (Signed)
 Subjective  CC:  Chief Complaint  Patient presents with   Follow-up    Here for a 6 month follow-up on hypertension and chronic problems. Has started to notice memory loss or lapse. Taking longer to recall.    Medical Management of Chronic Issues   Hypertension    HPI: Tina Dalton is a 73 y.o. female who presents to the office today to address the problems listed above in the chief complaint. Discussed the use of AI scribe software for clinical note transcription with the patient, who gave verbal consent to proceed.  History of Present Illness Tina Dalton is a 73 year old female who presents for a six-month follow-up for thyroid  management and concerns about memory lapses.  hypothyroidism - Six-month follow-up for thyroid  management - levothyroxine  112 mcg w/ recent labs showing good control - Feels physically well - Describes condition as being on 'autopilot' - no sxs of low or high thyroid   Memory lapses - Two to three episodes over the past few months with difficult time remember what she did the day before.  - Episodes possibly related to monotony of daily routine - No significant red flags such as forgetting routine tasks or repetitive questioning - No concerns raised by her daughter, who monitors her - Family history of memory issues: grandmother with 'hardening of the arteries,' mother with memory issues in her nineties - Engages in cognitive activities such as word search puzzles  Reviewed recent cards eval: f/u PAF, prehypertension and HLD: no cardiac concerns. No palpitations or cp. On eloquis w/o adverse effects. On crestor  and zetia . Cholesterol with good control. Bp is normal now. No sob  Preventive health screening - No mammogram in two years - No history of bone density screening - No history of colorectal cancer screening   Assessment  1. Acquired hypothyroidism   2. Prehypertension   3. Mixed hyperlipidemia   4. PAF (paroxysmal atrial fibrillation)  (HCC)   5. Screening for colorectal cancer   6. Screening mammogram for breast cancer   7. Asymptomatic menopausal state      Plan  Assessment and Plan Assessment & Plan Adult Wellness Visit Routine wellness visit with no significant concerns. Memory lapses discussed, but no red flags for Alzheimer's or other cognitive disorders. Family history of memory issues noted, but no immediate concerns. Emphasized importance of regular screenings for breast, bone, and colorectal cancer due to increased risk with age. - Continue annual wellness visits with cognitive screening. - Encouraged activities to keep the brain active, such as word search puzzles. - extensive education on preventative screens - Ordered mammogram and bone density screening. - Ordered Cologuard for colorectal cancer screening.  Acquired hypothyroidism Thyroid  levels are well-controlled with current medication dosage. No clinical symptoms indicating need for adjustment. - Continue current thyroid  medication dosage.112 mcg daily  Prehypertension Blood pressure is well-managed with no immediate concerns. Normal today  Paroxysmal atrial fibrillation Condition is well-managed with no recent episodes or concerns. Recent cardiology evaluation indicates stability. - Continue current management and monitoring. Eloquis and cardizem   HLD almost at goal on crestor  and zetia . Good diet.     Follow up: 6 mo for cpe Orders Placed This Encounter  Procedures   HM DEXA SCAN   MM DIGITAL SCREENING BILATERAL   Cologuard   No orders of the defined types were placed in this encounter.    I reviewed the patients updated PMH, FH, and SocHx.  Patient Active Problem List   Diagnosis Date Noted  PAF (paroxysmal atrial fibrillation) (HCC) 09/07/2024    Priority: High   TIA (transient ischemic attack) 05/25/2023    Priority: High   Palpitations 04/02/2023    Priority: High   Coronary artery calcification 04/02/2023    Priority:  High   Mixed hyperlipidemia 04/01/2021    Priority: High   Acquired hypothyroidism 11/17/2017    Priority: High   Prehypertension 11/17/2017    Priority: High   Migraine with aura and without status migrainosus, not intractable 05/25/2023    Priority: Medium    Grade I diastolic dysfunction 04/14/2023    Priority: Medium    Osteopenia after menopause 04/01/2020    Priority: Medium    Heart murmur, systolic 01/02/2020    Priority: Low   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 Family History: Patient family history includes Alzheimer's disease in her mother; Cancer in her father; Coronary artery disease in her father; Heart attack in her father; Heart failure in her father; Hypertension in her father. Social History:  Patient  reports that she has never smoked. She has never used smokeless tobacco. She reports current alcohol use. She reports that she does not use drugs.  Review of Systems: Constitutional: Negative for fever malaise or anorexia Cardiovascular: negative for chest pain Respiratory: negative for SOB or persistent cough Gastrointestinal: negative for abdominal pain  Objective  Vitals: BP 112/72 (BP Location: Left Arm, Patient Position: Sitting, Cuff Size: Normal)   Pulse 77   Temp (!) 97 F (36.1 C) (Temporal)   Ht 5' 10 (1.778 m)   Wt 167 lb 6.4 oz (75.9 kg)   LMP  (LMP Unknown)   SpO2 98%   BMI 24.02 kg/m  General: no acute distress , A&Ox3 HEENT: PEERL, conjunctiva normal, neck is supple Cardiovascular:  RRR without murmur or gallop.  Respiratory:  Good breath sounds bilaterally, CTAB with normal respiratory effort Commons side effects, risks, benefits, and alternatives for medications and treatment plan prescribed  today were discussed, and the patient expressed understanding of the given instructions. Patient is instructed to call or message via MyChart if he/she has any questions or concerns regarding our treatment plan. No barriers to understanding were identified. We discussed Red Flag symptoms and signs in detail. Patient expressed understanding regarding what to do in case of urgent or emergency type symptoms.  Medication list was reconciled, printed and provided to the patient in AVS. Patient instructions and summary information was reviewed with the patient as documented in the AVS. This note was prepared with assistance of Dragon voice recognition software. Occasional wrong-word or sound-a-like substitutions may have occurred due to the inherent limitations of voice recognition software    [1]  Current Meds  Medication Sig   apixaban  (ELIQUIS ) 5 MG TABS tablet Take 1 tablet (5 mg total) by mouth 2 (two) times daily.   Cholecalciferol  (VITAMIN D3) 50 MCG (2000 UT) capsule Take 2,000 Units by mouth daily.   diltiazem  (CARDIZEM  CD) 120 MG 24 hr capsule Take 1 capsule (120 mg total) by mouth daily.   ezetimibe  (ZETIA ) 10 MG tablet Take 1 tablet (10 mg total) by mouth daily.   levothyroxine  (SYNTHROID ) 112 MCG tablet Take 1 tablet (112 mcg total) by mouth daily.   metoprolol  succinate (TOPROL -XL) 25 MG 24  hr tablet Take 1 tablet (25 mg total) by mouth daily.   rosuvastatin  (CRESTOR ) 40 MG tablet Take 1 tablet (40 mg total) by mouth daily.   [DISCONTINUED] calcium -vitamin D  (OSCAL WITH D) 500-5 MG-MCG tablet Take 1 tablet by mouth.

## 2024-09-07 NOTE — Patient Instructions (Signed)
 Please return in July 2026 for your annual complete physical; please come fasting. For follow up on chronic medical conditions   If you have any questions or concerns, please don't hesitate to send me a message via MyChart or call the office at [phone]. Thank you for visiting with us  today! It's our pleasure caring for you.   Please call the office checked below to schedule your appointment for your mammogram and/or bone density screen (the checked studies were ordered): [x]   Mammogram  [x]   Bone Density  [x]   [facility]     [address] [place], [place]        [phone]         []   [facility]  [address] [place], [place]  [phone]   I recommend the Cologuard test for your colon cancer screening that is due. I have ordered this test for you. The Cologuard company will soon contact you to verify your insurance, address etc. They will then send you the kit; follow the instructions in the kit and return the kit to [employer]. They will run the test and send the results to me. I will then give you the results. If this test is negative, we recommend repeating a colon cancer screening test in 3 years. If it is positive, I will refer you to a Gastroenterologist so you can get set up for the recommended colonoscopy.  Thank you!   VISIT SUMMARY: Today, you had a six-month follow-up appointment to manage your thyroid  condition and discuss your recent memory lapses. We also reviewed your cardiovascular health and preventive health screenings.  YOUR PLAN: -ADULT WELLNESS VISIT: This was a routine wellness visit where we discussed your memory lapses. There are no immediate concerns about Alzheimer's or other cognitive disorders, but we noted your family history of memory issues. We emphasized the importance of regular screenings for breast, bone, and colorectal cancer due to increased risk with age. You should continue annual wellness visits  with cognitive screening and engage in activities to keep your brain active, such as word search puzzles. We have ordered a mammogram, bone density screening, and Cologuard for colorectal cancer screening.  -ACQUIRED HYPOTHYROIDISM: Your thyroid  levels are well-controlled with your current medication dosage, and there are no symptoms indicating a need for adjustment. Continue taking your current thyroid  medication as prescribed.  -PREHYPERTENSION: Your blood pressure is well-managed, and there are no immediate concerns. Prehypertension means your blood pressure is higher than normal but not yet in the high blood pressure range.  -PAROXYSMAL ATRIAL FIBRILLATION: This condition involves irregular heartbeats that come and go. Your condition is well-managed with no recent episodes or concerns. Continue with your current management and monitoring as advised by your cardiologist.  INSTRUCTIONS: Please schedule your mammogram, bone density screening, and Cologuard test as soon as possible. Continue with your current thyroid  medication and follow up with your cardiologist as recommended. Maintain your annual wellness visits with cognitive screening.                      Contains text generated by [employer].                                 Contains text generated by [employer].

## 2024-09-27 ENCOUNTER — Ambulatory Visit: Payer: Self-pay | Admitting: Family Medicine

## 2024-09-27 LAB — COLOGUARD: COLOGUARD: NEGATIVE

## 2024-09-27 NOTE — Progress Notes (Signed)
Negative cologuard. Mc note sent
